# Patient Record
Sex: Male | Born: 1990 | Race: White | Hispanic: No | Marital: Single | Smoking: Current some day smoker
Health system: Southern US, Community
[De-identification: ages and names within clinical notes are randomized; demographics above are authoritative.]

## PROBLEM LIST (undated history)

## (undated) DIAGNOSIS — F329 Major depressive disorder, single episode, unspecified: Secondary | ICD-10-CM

## (undated) DIAGNOSIS — R569 Unspecified convulsions: Secondary | ICD-10-CM

## (undated) DIAGNOSIS — G610 Guillain-Barre syndrome: Secondary | ICD-10-CM

## (undated) DIAGNOSIS — F419 Anxiety disorder, unspecified: Secondary | ICD-10-CM

## (undated) HISTORY — PX: OTHER SURGICAL HISTORY: SHX169

## (undated) HISTORY — PX: TONSILLECTOMY: SUR1361

---
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
## 2013-01-02 ENCOUNTER — Emergency Department (HOSPITAL_COMMUNITY)
Admission: EM | Admit: 2013-01-02 | Discharge: 2013-01-02 | Disposition: A | Discharge status: Home / Self Care | Payer: Self-pay | Attending: Emergency Medicine | Admitting: Emergency Medicine

## 2013-01-02 ENCOUNTER — Encounter (HOSPITAL_COMMUNITY): Payer: Self-pay | Admitting: Emergency Medicine

## 2013-01-02 DIAGNOSIS — Y929 Unspecified place or not applicable: Secondary | ICD-10-CM | POA: Insufficient documentation

## 2013-01-02 DIAGNOSIS — Y939 Activity, unspecified: Secondary | ICD-10-CM | POA: Insufficient documentation

## 2013-01-02 DIAGNOSIS — L0390 Cellulitis, unspecified: Secondary | ICD-10-CM

## 2013-01-02 DIAGNOSIS — W57XXXA Bitten or stung by nonvenomous insect and other nonvenomous arthropods, initial encounter: Secondary | ICD-10-CM | POA: Insufficient documentation

## 2013-01-02 DIAGNOSIS — L02419 Cutaneous abscess of limb, unspecified: Secondary | ICD-10-CM | POA: Insufficient documentation

## 2013-01-02 DIAGNOSIS — T148 Other injury of unspecified body region: Secondary | ICD-10-CM | POA: Insufficient documentation

## 2013-01-02 DIAGNOSIS — R21 Rash and other nonspecific skin eruption: Secondary | ICD-10-CM | POA: Insufficient documentation

## 2013-01-02 DIAGNOSIS — L03119 Cellulitis of unspecified part of limb: Secondary | ICD-10-CM | POA: Insufficient documentation

## 2013-01-02 MED ORDER — PERMETHRIN 5 % EX CREA: TOPICAL_CREAM | Freq: Once | CUTANEOUS | Status: DC

## 2013-01-02 MED ORDER — HYDROCORTISONE 2.5 % EX LOTN
TOPICAL_LOTION | Freq: Two times a day (BID) | CUTANEOUS | Status: DC
Start: 2013-01-02 — End: 2015-05-18

## 2013-01-02 MED ORDER — DIPHENHYDRAMINE HCL 50 MG/ML IJ SOLN
25.0000 mg | Freq: Once | INTRAMUSCULAR | Status: AC
  Administered 2013-01-02: 25 mg via INTRAMUSCULAR
  Filled 2013-01-02: qty 1

## 2013-01-02 MED ORDER — CEPHALEXIN 250 MG PO CAPS: 500.0000 mg | ORAL_CAPSULE | Freq: Once | ORAL | Status: DC

## 2013-01-02 MED ORDER — CEPHALEXIN 500 MG PO CAPS: 500.0000 mg | ORAL_CAPSULE | Freq: Four times a day (QID) | ORAL | Status: DC

## 2013-01-02 NOTE — ED Provider Notes (Signed)
History    This chart was scribed for non-physician practitioner working with Donald Anger, DO by [name], ED Scribe. This patient was seen in room TR10C/TR10C and the patient's care was started at 2032.   CSN: [id number]  Arrival date & time 01/02/13  2032   None     Chief Complaint  Patient presents with  . Insect Bite     The history is provided by the patient. No language interpreter was used.    Donald Mckay is a 22 y.o. male who presents to the Emergency Department complaining of new, constant, multiple insect bites to the fingers, upper extremities and RLE onset yesterday. Pt states he has other people who live with him and they do not show any similar symptoms; however patient's sexual partner states he is one bite on him. He states [employer] came by to spray their apartment yesterday for bugs and he likes to lay on the floor. Pt's partner has a dog but states "he is very clean." He reports the bites are very itchy and stinging. Pt reports changing the mattress and bombing the apartment for bugs.   History reviewed. No pertinent past medical history.  History reviewed. No pertinent past surgical history.  No family history on file.  History  Substance Use Topics  . Smoking status: Never Smoker   . Smokeless tobacco: Not on file  . Alcohol Use: No      Review of Systems  Constitutional: Negative for fever, diaphoresis, appetite change, fatigue and unexpected weight change.  HENT: Negative for mouth sores and neck stiffness.   Eyes: Negative for visual disturbance.  Respiratory: Negative for cough, chest tightness, shortness of breath and wheezing.   Cardiovascular: Negative for chest pain.  Gastrointestinal: Negative for nausea, vomiting, abdominal pain, diarrhea and constipation.  Endocrine: Negative for polydipsia, polyphagia and polyuria.  Genitourinary: Negative for dysuria, urgency, frequency and hematuria.  Musculoskeletal: Negative for back  pain.  Skin: Positive for rash.  Allergic/Immunologic: Negative for immunocompromised state.  Neurological: Negative for syncope, light-headedness and headaches.  Hematological: Does not bruise/bleed easily.  Psychiatric/Behavioral: Negative for sleep disturbance. The patient is not nervous/anxious.     Allergies  Ibuprofen  Home Medications   Current Outpatient Rx  Name  Route  Sig  Dispense  Refill  . cephALEXin (KEFLEX) 500 MG capsule   Oral   Take 1 capsule (500 mg total) by mouth 4 (four) times daily.   40 capsule   0   . hydrocortisone 2.5 % lotion   Topical   Apply topically 2 (two) times daily.   59 mL   0   . permethrin (ACTICIN) 5 % cream   Topical   Apply topically once.   60 g   0     BP 146/84  Pulse 93  Temp(Src) 98.8 F (37.1 C) (Oral)  Resp 16  SpO2 97%  Physical Exam  Nursing note and vitals reviewed. Constitutional: He appears well-developed and well-nourished. No distress.  HENT:  Head: Normocephalic and atraumatic.  Mouth/Throat: Oropharynx is clear and moist. No oropharyngeal exudate.  Eyes: Conjunctivae are normal. No scleral icterus.  Neck: Normal range of motion. Neck supple.  Cardiovascular: Normal rate, regular rhythm and intact distal pulses.   Pulmonary/Chest: Effort normal and breath sounds normal. No respiratory distress. He has no wheezes.  Abdominal: Soft. Bowel sounds are normal. He exhibits no mass. There is no tenderness. There is no rebound and no guarding.  Musculoskeletal: Normal range of motion.  He exhibits no edema.  Neurological: He is alert.  Speech is clear and goal oriented Moves extremities without ataxia  Skin: Skin is warm and dry. Rash noted. He is not diaphoretic.  Multiple, scattered, erythematous, raised papules with significant excoriation. 3cm x 3cm patch on RLE with mild cellulitis but no area fluctuance. Lesions noted between the fingers on the right hand.   Psychiatric: He has a normal mood and affect.     ED Course  Procedures (including critical care time)  DIAGNOSTIC STUDIES: Oxygen Saturation is 97% on room air, adequate by my interpretation.    COORDINATION OF CARE: 10:15 PM-Discussed treatment plan with pt at bedside and pt agreed to plan.    Labs Reviewed - No data to display No results found.   1. Insect bite   2. Cellulitis       MDM  Rudene Re sounds with multiple bites to the legs, feet and arms. Right ear consistent with insect bites such as bed bugs are possibly mosquitoes.  Left hand with several bites between the webs of the fingers, but no other lesions consistent with scabies.  Right lower extremity lesion with mild cellulitis. We'll discharge home with Keflex, permethrin and hydrocortisone lotion. Suggested the patient follow directions to rid apartment of scabies or bed bugs.  I have also discussed reasons to return immediately to the ER.  Patient expresses understanding and agrees with plan.  I personally performed the services described in this documentation, which was scribed in my presence. The recorded information has been reviewed and is accurate.   Donald Client Roseline Ebarb, PA-C 01/02/13 2313

## 2013-01-02 NOTE — ED Notes (Signed)
PT. REPORTS MULTIPLE INSECT BITES AT FINGERS/RIGHT LOWER LEG ONSET YESTERDAY .

## 2013-01-03 NOTE — ED Provider Notes (Signed)
Medical screening examination/treatment/procedure(s) were performed by non-physician practitioner and as supervising physician I was immediately available for consultation/collaboration.   [name], DO 01/03/13 [phone]

## 2013-06-17 ENCOUNTER — Emergency Department: Payer: Self-pay | Admitting: Emergency Medicine

## 2013-06-17 LAB — CBC
HGB: 15.6 g/dL (ref 13.0–18.0)
MCH: 31 pg (ref 26.0–34.0)
MCHC: 35 g/dL (ref 32.0–36.0)
MCV: 89 fL (ref 80–100)
RBC: 5.04 10*6/uL (ref 4.40–5.90)
WBC: 8.2 10*3/uL (ref 3.8–10.6)

## 2013-06-17 LAB — COMPREHENSIVE METABOLIC PANEL
Albumin: 4.2 g/dL (ref 3.4–5.0)
Alkaline Phosphatase: 82 U/L (ref 50–136)
Anion Gap: 3 — ABNORMAL LOW (ref 7–16)
BUN: 7 mg/dL (ref 7–18)
Calcium, Total: 9.3 mg/dL (ref 8.5–10.1)
EGFR (African American): >60
Potassium: 3.6 mmol/L (ref 3.5–5.1)
SGPT (ALT): 52 U/L (ref 12–78)
Sodium: 139 mmol/L (ref 136–145)
Total Protein: 7 g/dL (ref 6.4–8.2)

## 2013-06-19 ENCOUNTER — Emergency Department: Payer: Self-pay | Admitting: Emergency Medicine

## 2013-06-19 LAB — TROPONIN I: Troponin-I: <0.02 ng/mL

## 2013-06-27 ENCOUNTER — Emergency Department: Payer: Self-pay | Admitting: Emergency Medicine

## 2013-06-27 LAB — COMPREHENSIVE METABOLIC PANEL
Albumin: 4.2 g/dL (ref 3.4–5.0)
Alkaline Phosphatase: 86 U/L (ref 50–136)
Anion Gap: 5 — ABNORMAL LOW (ref 7–16)
BUN: 12 mg/dL (ref 7–18)
Creatinine: 1.03 mg/dL (ref 0.60–1.30)
EGFR (African American): >60
EGFR (Non-African Amer.): >60
Glucose: 98 mg/dL (ref 65–99)
SGOT(AST): 22 U/L (ref 15–37)
Total Protein: 7.5 g/dL (ref 6.4–8.2)

## 2013-06-27 LAB — CBC
MCV: 89 fL (ref 80–100)
Platelet: 272 10*3/uL (ref 150–440)
RBC: 5.19 10*6/uL (ref 4.40–5.90)
WBC: 8.1 10*3/uL (ref 3.8–10.6)

## 2013-06-27 LAB — CK TOTAL AND CKMB (NOT AT ARMC)
CK, Total: 64 U/L (ref 35–232)
CK-MB: 0.5 ng/mL (ref 0.5–3.6)

## 2013-06-27 LAB — PROTIME-INR: Prothrombin Time: 13.7 secs (ref 11.5–14.7)

## 2013-06-27 LAB — MONONUCLEOSIS SCREEN: Mono Test: NEGATIVE

## 2013-06-27 LAB — TROPONIN I: Troponin-I: <0.02 ng/mL

## 2013-06-27 LAB — APTT: Activated PTT: 29.4 secs (ref 23.6–35.9)

## 2014-01-28 ENCOUNTER — Emergency Department: Payer: Self-pay | Admitting: Emergency Medicine

## 2014-01-28 LAB — COMPREHENSIVE METABOLIC PANEL
ALT: 33 U/L (ref 12–78)
ANION GAP: 4 — AB (ref 7–16)
Albumin: 3.9 g/dL (ref 3.4–5.0)
Alkaline Phosphatase: 69 U/L
BILIRUBIN TOTAL: 0.4 mg/dL (ref 0.2–1.0)
BUN: 10 mg/dL (ref 7–18)
CALCIUM: 9 mg/dL (ref 8.5–10.1)
CHLORIDE: 108 mmol/L — AB (ref 98–107)
Co2: 29 mmol/L (ref 21–32)
Creatinine: 0.79 mg/dL (ref 0.60–1.30)
EGFR (African American): >60
Glucose: 94 mg/dL (ref 65–99)
Osmolality: 280 (ref 275–301)
POTASSIUM: 4.2 mmol/L (ref 3.5–5.1)
SGOT(AST): 22 U/L (ref 15–37)
Sodium: 141 mmol/L (ref 136–145)
TOTAL PROTEIN: 7.3 g/dL (ref 6.4–8.2)

## 2014-01-28 LAB — CBC WITH DIFFERENTIAL/PLATELET
Basophil #: 0.1 10*3/uL (ref 0.0–0.1)
Basophil %: 1 %
EOS ABS: 0.4 10*3/uL (ref 0.0–0.7)
EOS PCT: 5 %
HCT: 46 % (ref 40.0–52.0)
HGB: 15.7 g/dL (ref 13.0–18.0)
LYMPHS ABS: 2.1 10*3/uL (ref 1.0–3.6)
Lymphocyte %: 26.7 %
MCH: 30.9 pg (ref 26.0–34.0)
MCHC: 34 g/dL (ref 32.0–36.0)
MCV: 91 fL (ref 80–100)
MONOS PCT: 6.1 %
Monocyte #: 0.5 x10 3/mm (ref 0.2–1.0)
Neutrophil #: 4.8 10*3/uL (ref 1.4–6.5)
Neutrophil %: 61.2 %
PLATELETS: 245 10*3/uL (ref 150–440)
RBC: 5.07 10*6/uL (ref 4.40–5.90)
RDW: 13.2 % (ref 11.5–14.5)
WBC: 7.9 10*3/uL (ref 3.8–10.6)

## 2014-01-28 LAB — TROPONIN I: Troponin-I: <0.02 ng/mL

## 2014-01-28 LAB — LIPASE, BLOOD: LIPASE: 169 U/L (ref 73–393)

## 2014-02-01 ENCOUNTER — Emergency Department: Payer: Self-pay | Admitting: Emergency Medicine

## 2014-02-01 LAB — CBC
HCT: 47.9 % (ref 40.0–52.0)
HGB: 16.3 g/dL (ref 13.0–18.0)
MCH: 31 pg (ref 26.0–34.0)
MCHC: 34 g/dL (ref 32.0–36.0)
MCV: 91 fL (ref 80–100)
Platelet: 258 10*3/uL (ref 150–440)
RBC: 5.26 10*6/uL (ref 4.40–5.90)
RDW: 13.1 % (ref 11.5–14.5)
WBC: 7.1 10*3/uL (ref 3.8–10.6)

## 2014-02-01 LAB — COMPREHENSIVE METABOLIC PANEL
ALBUMIN: 4.1 g/dL (ref 3.4–5.0)
ALK PHOS: 68 U/L
ALT: 30 U/L (ref 12–78)
AST: 25 U/L (ref 15–37)
Anion Gap: 2 — ABNORMAL LOW (ref 7–16)
BILIRUBIN TOTAL: 0.7 mg/dL (ref 0.2–1.0)
BUN: 16 mg/dL (ref 7–18)
CALCIUM: 9 mg/dL (ref 8.5–10.1)
CREATININE: 1 mg/dL (ref 0.60–1.30)
Chloride: 108 mmol/L — ABNORMAL HIGH (ref 98–107)
Co2: 28 mmol/L (ref 21–32)
GLUCOSE: 87 mg/dL (ref 65–99)
OSMOLALITY: 276 (ref 275–301)
POTASSIUM: 4 mmol/L (ref 3.5–5.1)
Sodium: 138 mmol/L (ref 136–145)
TOTAL PROTEIN: 7.5 g/dL (ref 6.4–8.2)

## 2014-02-01 LAB — TROPONIN I: Troponin-I: <0.02 ng/mL

## 2014-04-21 ENCOUNTER — Emergency Department: Payer: Self-pay | Admitting: Emergency Medicine

## 2014-04-21 LAB — COMPREHENSIVE METABOLIC PANEL
ALBUMIN: 4.1 g/dL (ref 3.4–5.0)
ALK PHOS: 65 U/L
ANION GAP: 9 (ref 7–16)
BUN: 8 mg/dL (ref 7–18)
Bilirubin,Total: 1 mg/dL (ref 0.2–1.0)
CALCIUM: 9.3 mg/dL (ref 8.5–10.1)
CO2: 27 mmol/L (ref 21–32)
CREATININE: 0.94 mg/dL (ref 0.60–1.30)
Chloride: 103 mmol/L (ref 98–107)
Glucose: 103 mg/dL — ABNORMAL HIGH (ref 65–99)
OSMOLALITY: 276 (ref 275–301)
POTASSIUM: 3.1 mmol/L — AB (ref 3.5–5.1)
SGOT(AST): 18 U/L (ref 15–37)
SGPT (ALT): 36 U/L
Sodium: 139 mmol/L (ref 136–145)
TOTAL PROTEIN: 7.4 g/dL (ref 6.4–8.2)

## 2014-04-21 LAB — MAGNESIUM: MAGNESIUM: 1.8 mg/dL

## 2014-04-21 LAB — LIPASE, BLOOD: Lipase: 132 U/L (ref 73–393)

## 2015-05-11 ENCOUNTER — Emergency Department (HOSPITAL_COMMUNITY): Payer: Medicaid Other

## 2015-05-11 ENCOUNTER — Inpatient Hospital Stay (HOSPITAL_COMMUNITY)
Admission: EM | Admit: 2015-05-11 | Discharge: 2015-05-18 | DRG: 095 | Disposition: A | Discharge status: Home Health | Payer: Medicaid Other | Attending: Internal Medicine | Admitting: Internal Medicine

## 2015-05-11 ENCOUNTER — Encounter (HOSPITAL_COMMUNITY): Payer: Self-pay | Admitting: *Deleted

## 2015-05-11 DIAGNOSIS — T40605A Adverse effect of unspecified narcotics, initial encounter: Secondary | ICD-10-CM | POA: Diagnosis not present

## 2015-05-11 DIAGNOSIS — R112 Nausea with vomiting, unspecified: Secondary | ICD-10-CM | POA: Diagnosis present

## 2015-05-11 DIAGNOSIS — D72829 Elevated white blood cell count, unspecified: Secondary | ICD-10-CM | POA: Diagnosis present

## 2015-05-11 DIAGNOSIS — F329 Major depressive disorder, single episode, unspecified: Secondary | ICD-10-CM | POA: Diagnosis present

## 2015-05-11 DIAGNOSIS — Z0189 Encounter for other specified special examinations: Secondary | ICD-10-CM

## 2015-05-11 DIAGNOSIS — F17210 Nicotine dependence, cigarettes, uncomplicated: Secondary | ICD-10-CM | POA: Diagnosis present

## 2015-05-11 DIAGNOSIS — K5909 Other constipation: Secondary | ICD-10-CM | POA: Diagnosis not present

## 2015-05-11 DIAGNOSIS — F32A Depression, unspecified: Secondary | ICD-10-CM | POA: Clinically undetermined

## 2015-05-11 DIAGNOSIS — G4700 Insomnia, unspecified: Secondary | ICD-10-CM | POA: Diagnosis not present

## 2015-05-11 DIAGNOSIS — Z823 Family history of stroke: Secondary | ICD-10-CM

## 2015-05-11 DIAGNOSIS — R200 Anesthesia of skin: Secondary | ICD-10-CM | POA: Diagnosis present

## 2015-05-11 DIAGNOSIS — E669 Obesity, unspecified: Secondary | ICD-10-CM | POA: Diagnosis present

## 2015-05-11 DIAGNOSIS — F419 Anxiety disorder, unspecified: Secondary | ICD-10-CM | POA: Diagnosis present

## 2015-05-11 DIAGNOSIS — R51 Headache: Secondary | ICD-10-CM | POA: Diagnosis not present

## 2015-05-11 DIAGNOSIS — Z6841 Body Mass Index (BMI) 40.0 and over, adult: Secondary | ICD-10-CM

## 2015-05-11 DIAGNOSIS — Z809 Family history of malignant neoplasm, unspecified: Secondary | ICD-10-CM

## 2015-05-11 DIAGNOSIS — Z886 Allergy status to analgesic agent status: Secondary | ICD-10-CM

## 2015-05-11 DIAGNOSIS — F17200 Nicotine dependence, unspecified, uncomplicated: Secondary | ICD-10-CM | POA: Diagnosis present

## 2015-05-11 DIAGNOSIS — H538 Other visual disturbances: Secondary | ICD-10-CM | POA: Diagnosis not present

## 2015-05-11 DIAGNOSIS — G959 Disease of spinal cord, unspecified: Secondary | ICD-10-CM

## 2015-05-11 DIAGNOSIS — G610 Guillain-Barre syndrome: Principal | ICD-10-CM | POA: Diagnosis present

## 2015-05-11 LAB — URINALYSIS, ROUTINE W REFLEX MICROSCOPIC
BILIRUBIN URINE: NEGATIVE
Glucose, UA: NEGATIVE mg/dL
Hgb urine dipstick: NEGATIVE
KETONES UR: NEGATIVE mg/dL
NITRITE: NEGATIVE
PH: 6 (ref 5.0–8.0)
PROTEIN: NEGATIVE mg/dL
Specific Gravity, Urine: 1.019 (ref 1.005–1.030)
Urobilinogen, UA: 0.2 mg/dL (ref 0.0–1.0)

## 2015-05-11 LAB — COMPREHENSIVE METABOLIC PANEL
ALK PHOS: 55 U/L (ref 38–126)
ALT: 37 U/L (ref 17–63)
AST: 31 U/L (ref 15–41)
Albumin: 4.1 g/dL (ref 3.5–5.0)
Anion gap: 4 — ABNORMAL LOW (ref 5–15)
BILIRUBIN TOTAL: 0.6 mg/dL (ref 0.3–1.2)
CALCIUM: 9.1 mg/dL (ref 8.9–10.3)
CHLORIDE: 109 mmol/L (ref 101–111)
CO2: 28 mmol/L (ref 22–32)
CREATININE: 0.8 mg/dL (ref 0.61–1.24)
Glucose, Bld: 93 mg/dL (ref 65–99)
Potassium: 3.7 mmol/L (ref 3.5–5.1)
Sodium: 141 mmol/L (ref 135–145)
TOTAL PROTEIN: 6.6 g/dL (ref 6.5–8.1)

## 2015-05-11 LAB — CBC
HCT: 45.3 % (ref 39.0–52.0)
Hemoglobin: 15.2 g/dL (ref 13.0–17.0)
MCH: 29.9 pg (ref 26.0–34.0)
MCHC: 33.6 g/dL (ref 30.0–36.0)
MCV: 89.2 fL (ref 78.0–100.0)
PLATELETS: 250 10*3/uL (ref 150–400)
RBC: 5.08 MIL/uL (ref 4.22–5.81)
RDW: 12.8 % (ref 11.5–15.5)
WBC: 7.3 10*3/uL (ref 4.0–10.5)

## 2015-05-11 LAB — RAPID URINE DRUG SCREEN, HOSP PERFORMED
AMPHETAMINES: NOT DETECTED
BARBITURATES: NOT DETECTED
BENZODIAZEPINES: NOT DETECTED
Cocaine: NOT DETECTED
Opiates: NOT DETECTED
Tetrahydrocannabinol: POSITIVE — AB

## 2015-05-11 LAB — ACETAMINOPHEN LEVEL: Acetaminophen (Tylenol), Serum: <10 ug/mL — ABNORMAL LOW (ref 10–30)

## 2015-05-11 LAB — SALICYLATE LEVEL

## 2015-05-11 LAB — URINE MICROSCOPIC-ADD ON

## 2015-05-11 LAB — AMMONIA: Ammonia: 46 umol/L — ABNORMAL HIGH (ref 9–35)

## 2015-05-11 LAB — LIPASE, BLOOD: Lipase: 25 U/L (ref 22–51)

## 2015-05-11 LAB — ETHANOL: Alcohol, Ethyl (B): <5 mg/dL (ref <5)

## 2015-05-11 MED ORDER — SODIUM CHLORIDE 0.9 % IV SOLN
INTRAVENOUS | Status: AC
  Administered 2015-05-12: via INTRAVENOUS

## 2015-05-11 MED ORDER — SENNOSIDES-DOCUSATE SODIUM 8.6-50 MG PO TABS: 1.0000 | ORAL_TABLET | Freq: Every evening | ORAL | Status: DC | PRN

## 2015-05-11 MED ORDER — SODIUM CHLORIDE 0.9 % IV BOLUS (SEPSIS)
1000.0000 mL | Freq: Once | INTRAVENOUS | Status: AC
  Administered 2015-05-11: 1000 mL via INTRAVENOUS

## 2015-05-11 NOTE — ED Notes (Signed)
Pt was discharged by mistake by Marinda Elk, RN. Wrong departure condition documented

## 2015-05-11 NOTE — Progress Notes (Signed)
CM spoke with pt who confirms uninsured [place] resident with no pcp.  CM discussed and provided written information for uninsured accepting pcps, discussed the importance of pcp vs EDP services for f/u care, www.needymeds.org, www.goodrx.com, discounted pharmacies and other [place] resources such as [facility] , [facility], affordable care act, financial assistance, uninsured dental services, [place] [facility] assist, DSS and  health department  Reviewed resources for [place] uninsured accepting pcps like [facility], [facility] [address], [facility], [facility], local urgent care centers, [facility], [facility], general medical clinics, [facility], [facility] plus others, medication resources, [facility] out patient [facility]ies and housing Pt voiced understanding and appreciation of resources provided   

## 2015-05-11 NOTE — ED Provider Notes (Signed)
CSN: [id number]     Arrival date & time 05/11/15  1400 History   First MD Initiated Contact with Patient 05/11/15 1637     Chief Complaint  Patient presents with  . Emesis  . Confusion      (Consider location/radiation/quality/duration/timing/severity/associated sxs/prior Treatment) HPI Comments: Patient presents to the emergency department with chief complaint of headache, neck pain, nausea, vomiting, and generalized numbness 3 hours. Patient states that the numbness started in his feet and has progressively moved upward. He denies any weakness. He denies any known medical problems, denies any substance abuse. Patient states that he has been having persistent nausea and vomiting for the past 2 days. States that he no longer has anything to vomit. He denies any abdominal pain, or dysuria. He states that he has never experienced anything like this before. There are no aggravating or alleviating factors.  The history is provided by the patient. No language interpreter was used.    History reviewed. No pertinent past medical history. History reviewed. No pertinent past surgical history. History reviewed. No pertinent family history. Social History  Substance Use Topics  . Smoking status: Never Smoker   . Smokeless tobacco: None  . Alcohol Use: No    Review of Systems  Constitutional: Negative for fever and chills.  Respiratory: Negative for shortness of breath.   Cardiovascular: Negative for chest pain.  Gastrointestinal: Positive for nausea and vomiting. Negative for diarrhea and constipation.  Genitourinary: Negative for dysuria.  Neurological: Positive for numbness and headaches.  All other systems reviewed and are negative.     Allergies  Ibuprofen  Home Medications   Prior to Admission medications   Medication Sig Start Date End Date Taking? Authorizing Provider  cephALEXin (KEFLEX) 500 MG capsule Take 1 capsule (500 mg total) by mouth 4 (four) times daily. 01/02/13    Hannah Muthersbaugh, PA-C  hydrocortisone 2.5 % lotion Apply topically 2 (two) times daily. 01/02/13   Hannah Muthersbaugh, PA-C  permethrin (ACTICIN) 5 % cream Apply topically once. 01/02/13   Hannah Muthersbaugh, PA-C   BP 147/100 mmHg  Pulse 77  Temp(Src) 98.1 F (36.7 C) (Oral)  Resp 16  SpO2 99% Physical Exam  Constitutional: He is oriented to person, place, and time. He appears well-developed and well-nourished.  HENT:  Head: Normocephalic and atraumatic.  Eyes: Conjunctivae and EOM are normal. Pupils are equal, round, and reactive to light. Right eye exhibits no discharge. Left eye exhibits no discharge. No scleral icterus.  Neck: Normal range of motion. Neck supple. No JVD present.  Cardiovascular: Normal rate, regular rhythm and normal heart sounds.  Exam reveals no gallop and no friction rub.   No murmur heard. Pulmonary/Chest: Effort normal and breath sounds normal. No respiratory distress. He has no wheezes. He has no rales. He exhibits no tenderness.  Abdominal: Soft. He exhibits no distension and no mass. There is no tenderness. There is no rebound and no guarding.  No focal abdominal tenderness, no RLQ tenderness or pain at McBurney's point, no RUQ tenderness or Murphy's sign, no left-sided abdominal tenderness, no fluid wave, or signs of peritonitis   Musculoskeletal: Normal range of motion. He exhibits no edema or tenderness.  Neurological: He is alert and oriented to person, place, and time.  Decreased sensation in upper and lower extremities, unable to decipher between sharp and dull sensation, reflexes are symmetrical but slightly diminished, strength is slightly diminished but equal bilaterally  Skin: Skin is warm and dry.  Psychiatric: He has a normal mood  and affect. His behavior is normal. Judgment and thought content normal.  Nursing note and vitals reviewed.   ED Course  Procedures (including critical care time) Results for orders placed or performed during the  hospital encounter of 05/11/15  Lipase, blood  Result Value Ref Range   Lipase 25 22 - 51 U/L  Comprehensive metabolic panel  Result Value Ref Range   Sodium 141 135 - 145 mmol/L   Potassium 3.7 3.5 - 5.1 mmol/L   Chloride 109 101 - 111 mmol/L   CO2 28 22 - 32 mmol/L   Glucose, Bld 93 65 - 99 mg/dL   BUN <5 (L) 6 - 20 mg/dL   Creatinine, Ser 1.02 0.61 - 1.24 mg/dL   Calcium 9.1 8.9 - 72.5 mg/dL   Total Protein 6.6 6.5 - 8.1 g/dL   Albumin 4.1 3.5 - 5.0 g/dL   AST 31 15 - 41 U/L   ALT 37 17 - 63 U/L   Alkaline Phosphatase 55 38 - 126 U/L   Total Bilirubin 0.6 0.3 - 1.2 mg/dL   GFR calc non Af Amer >60 >60 mL/min   GFR calc Af Amer >60 >60 mL/min   Anion gap 4 (L) 5 - 15  CBC  Result Value Ref Range   WBC 7.3 4.0 - 10.5 K/uL   RBC 5.08 4.22 - 5.81 MIL/uL   Hemoglobin 15.2 13.0 - 17.0 g/dL   HCT 36.6 44.0 - 34.7 %   MCV 89.2 78.0 - 100.0 fL   MCH 29.9 26.0 - 34.0 pg   MCHC 33.6 30.0 - 36.0 g/dL   RDW 42.5 95.6 - 38.7 %   Platelets 250 150 - 400 K/uL  Urinalysis, Routine w reflex microscopic (not at [facility])  Result Value Ref Range   Color, Urine AMBER (A) YELLOW   APPearance CLOUDY (A) CLEAR   Specific Gravity, Urine 1.019 1.005 - 1.030   pH 6.0 5.0 - 8.0   Glucose, UA NEGATIVE NEGATIVE mg/dL   Hgb urine dipstick NEGATIVE NEGATIVE   Bilirubin Urine NEGATIVE NEGATIVE   Ketones, ur NEGATIVE NEGATIVE mg/dL   Protein, ur NEGATIVE NEGATIVE mg/dL   Urobilinogen, UA 0.2 0.0 - 1.0 mg/dL   Nitrite NEGATIVE NEGATIVE   Leukocytes, UA SMALL (A) NEGATIVE  Acetaminophen level  Result Value Ref Range   Acetaminophen (Tylenol), Serum <10 (L) 10 - 30 ug/mL  Salicylate level  Result Value Ref Range   Salicylate Lvl <4.0 2.8 - 30.0 mg/dL  Ethanol  Result Value Ref Range   Alcohol, Ethyl (B) <5 <5 mg/dL  Urine rapid drug screen (hosp performed)  Result Value Ref Range   Opiates NONE DETECTED NONE DETECTED   Cocaine NONE DETECTED NONE DETECTED   Benzodiazepines NONE DETECTED NONE  DETECTED   Amphetamines NONE DETECTED NONE DETECTED   Tetrahydrocannabinol POSITIVE (A) NONE DETECTED   Barbiturates NONE DETECTED NONE DETECTED  Ammonia  Result Value Ref Range   Ammonia 46 (H) 9 - 35 umol/L  Urine microscopic-add on  Result Value Ref Range   Squamous Epithelial / LPF FEW (A) RARE   WBC, UA 3-6 <3 WBC/hpf   Urine-Other MUCOUS PRESENT    Ct Head Wo Contrast  05/11/2015   CLINICAL DATA:  Confusion, numbness, vomiting  EXAM: CT HEAD WITHOUT CONTRAST  TECHNIQUE: Contiguous axial images were obtained from the base of the skull through the vertex without intravenous contrast.  COMPARISON:  None.  FINDINGS: No skull fracture is noted. Paranasal sinuses and mastoid air cells are  unremarkable. No intracranial hemorrhage, mass effect or midline shift. No acute infarction. No hydrocephalus. No mass lesion is noted on this unenhanced scan.  IMPRESSION: No acute intracranial abnormality.   Electronically Signed   By: Natasha Mead M.D.   On: 05/11/2015 17:49     Imaging Review No results found. I have personally reviewed and evaluated these images and lab results as part of my medical decision-making.   EKG Interpretation None      MDM   Final diagnoses:  Numbness   Patient with numbness ascending from feet to arms.  Unable to decipher between sharp and dull.  Difficult to tell if patient is being honest on exam.  Patient seen by and discussed with Dr. Corlis Leak, who recommend LP.  Differential includes Guillain-Barr.  Patient does not have loss of bowel or bladder function.  No respiratory distress.  8:37 PM Patient discussed with Dr. Hosie Poisson of neurology, who agrees with plan for admission and LP by IR in the morning.  Dr. Hosie Poisson will consult either tonight or in the morning.  Appreciate Dr. Toniann Fail for admitting the patient.  Roxy Horseman, PA-C 05/11/15 2103  Courteney Randall An, MD 05/11/15 1610  Courteney Randall An, MD 05/11/15 9604

## 2015-05-11 NOTE — ED Notes (Addendum)
Per ems pt is from home, c/o nausea/vomiting x2 days. generalized all over numbness x3 hours, started in feet and radiated upwards. Pt has intermittent confusion, pt able to give medical hx. Pt denies ETOH/substance abuse. Positive for nystagmus, no neurological changes that are continuous. Hand shake when pt not being engaged in conversation.   Upon rn assessment pt able to answer all questions appropriately, pts affect is slightly odd. Pt reports headache 10/10. Reports the last time he vomited was 1000.

## 2015-05-11 NOTE — ED Notes (Signed)
Pt is stable and in appropriate condition for discharge

## 2015-05-11 NOTE — ED Notes (Signed)
Patient is aware we need urine-urine at bedside  

## 2015-05-11 NOTE — H&P (Signed)
[facility] History and Physical  [name]. ZOX:[id number] DOB: Apr 01, 1991 DOA: 05/11/2015  Referring physician: Mr. Donald Client. PA. PCP: No PCP Per Patient  Specialists: None.  Chief Complaint: Numbness of the lower and upper extremities.  HPI: Donald Mckay. is a 24 y.o. male with no significant past medical history presents to the ER because of increasing numbness of the extremities progressing upwards. Patient's symptoms started yesterday around 11 AM. Patient's initial symptoms were numbness around his foot which has progressed to his upper extremities at this time. Patient states she also has sensation loss. Denies any incontinence of urine or bowels. On exam patient has poor deep tendon reflexes. Denies any recent fever chills or diarrhea episodes. Denies any recent sick contacts or vaccinations. On-call neurologist Dr. Hosie Poisson was consulted. Lumbar puncture was attempted in the ER but was unsuccessful and fluoroscopic guided lumbar puncture has been ordered. CT head is unremarkable. Patient denies any difficulty speaking or swallowing or breathing.   Review of Systems: As presented in the history of presenting illness, rest negative.  Past Medical History  Diagnosis Date  . Medical history non-contributory    Past Surgical History  Procedure Laterality Date  . Nasal cauterization     Social History:  reports that he has been smoking.  He does not have any smokeless tobacco history on file. He reports that he does not drink alcohol or use illicit drugs. Where does patient live home. Can patient participate in ADLs? Yes.  Allergies  Allergen Reactions  . Ibuprofen     Nose bleeds    Family History:  Family History  Problem Relation Age of Onset  . Cancer Mother   . Stroke Maternal Grandmother       Prior to Admission medications   Medication Sig Start Date End Date Taking? Authorizing Provider  cephALEXin (KEFLEX) 500 MG capsule Take 1  capsule (500 mg total) by mouth 4 (four) times daily. Patient not taking: Reported on 05/11/2015 01/02/13   Donald Client Muthersbaugh, PA-C  hydrocortisone 2.5 % lotion Apply topically 2 (two) times daily. Patient not taking: Reported on 05/11/2015 01/02/13   Donald Client Muthersbaugh, PA-C  permethrin (ACTICIN) 5 % cream Apply topically once. Patient not taking: Reported on 05/11/2015 01/02/13   Donald Forth, PA-C    Physical Exam: Filed Vitals:   05/11/15 1746 05/11/15 1955 05/11/15 2058 05/11/15 2221  BP: 137/74 136/84  134/78  Pulse: 67 72  68  Temp:  98.7 F (37.1 C) 98.4 F (36.9 C) 98.6 F (37 C)  TempSrc:  Oral  Oral  Resp: 18 18  18   Height:    6\' 1"  (1.854 m)  Weight:    138.8 kg (306 lb)  SpO2: 99% 100%  99%     General:  Obese not in distress.  Eyes: Anicteric no pallor.  ENT: No discharge from the ears eyes nose and mouth.  Neck: No neck rigidity.  Cardiovascular: S1-S2 heard.  Respiratory: No rhonchi or crepitations.  Abdomen: Soft nontender bowel sounds present.  Skin: No rash.  Musculoskeletal: No edema.  Psychiatric: Appears normal.  Neurologic: Alert awake oriented to time place and person. Moves all extremities. Has poor deep tendon refluxes. Patient also states he has poor sensation of his extremities at this time. No facial asymmetry. Tongue is midline. PERRLA positive.  Labs on Admission:  Basic Metabolic Panel:  Recent Labs Lab 05/11/15 1452  NA 141  K 3.7  CL 109  CO2 28  GLUCOSE 93  BUN <  5*  CREATININE 0.80  CALCIUM 9.1   Liver Function Tests:  Recent Labs Lab 05/11/15 1452  AST 31  ALT 37  ALKPHOS 55  BILITOT 0.6  PROT 6.6  ALBUMIN 4.1    Recent Labs Lab 05/11/15 1452  LIPASE 25    Recent Labs Lab 05/11/15 1800  AMMONIA 46*   CBC:  Recent Labs Lab 05/11/15 1452  WBC 7.3  HGB 15.2  HCT 45.3  MCV 89.2  PLT 250   Cardiac Enzymes: No results for input(s): CKTOTAL, CKMB, CKMBINDEX, TROPONINI in the last 168  hours.  BNP (last 3 results) No results for input(s): BNP in the last 8760 hours.  ProBNP (last 3 results) No results for input(s): PROBNP in the last 8760 hours.  CBG: No results for input(s): GLUCAP in the last 168 hours.  Radiological Exams on Admission: Ct Head Wo Contrast  05/11/2015   CLINICAL DATA:  Confusion, numbness, vomiting  EXAM: CT HEAD WITHOUT CONTRAST  TECHNIQUE: Contiguous axial images were obtained from the base of the skull through the vertex without intravenous contrast.  COMPARISON:  None.  FINDINGS: No skull fracture is noted. Paranasal sinuses and mastoid air cells are unremarkable. No intracranial hemorrhage, mass effect or midline shift. No acute infarction. No hydrocephalus. No mass lesion is noted on this unenhanced scan.  IMPRESSION: No acute intracranial abnormality.   Electronically Signed   By: Natasha Mead M.D.   On: 05/11/2015 17:49     Assessment/Plan Principal Problem:   Numbness   1. 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 Fluoroscopic guided lumbar puncture has been ordered since initial attempt by the ER physician was unsuccessful. On-call neurologist Dr. Hosie Poisson has been consulted and will be seeing patient in consult. At this time patient has been placed on neuro checks and close observation for any respiratory failure. Further recommendation based on lumbar puncture and neurology evaluation. 2. Tobacco abuse - tobacco cessation counseling requested. Chest x-ray is pending.   DVT Prophylaxis SCDs.  Code Status: Full code.  Family Communication: Discussed with patient.  Disposition Plan: Admit for observation.    KAKRAKANDY,ARSHAD N. [facility] Pager [phone].  If 7PM-7AM, please contact night-coverage [email or web address] Password [facility] 05/11/2015, 10:45 PM

## 2015-05-12 ENCOUNTER — Observation Stay (HOSPITAL_COMMUNITY): Payer: Medicaid Other

## 2015-05-12 DIAGNOSIS — F17200 Nicotine dependence, unspecified, uncomplicated: Secondary | ICD-10-CM | POA: Diagnosis present

## 2015-05-12 DIAGNOSIS — Z0189 Encounter for other specified special examinations: Secondary | ICD-10-CM | POA: Diagnosis not present

## 2015-05-12 DIAGNOSIS — F329 Major depressive disorder, single episode, unspecified: Secondary | ICD-10-CM | POA: Diagnosis not present

## 2015-05-12 DIAGNOSIS — Z720 Tobacco use: Secondary | ICD-10-CM | POA: Diagnosis not present

## 2015-05-12 DIAGNOSIS — R200 Anesthesia of skin: Secondary | ICD-10-CM | POA: Diagnosis not present

## 2015-05-12 DIAGNOSIS — G959 Disease of spinal cord, unspecified: Secondary | ICD-10-CM | POA: Diagnosis not present

## 2015-05-12 DIAGNOSIS — G610 Guillain-Barre syndrome: Secondary | ICD-10-CM | POA: Diagnosis not present

## 2015-05-12 LAB — COMPREHENSIVE METABOLIC PANEL
ALK PHOS: 50 U/L (ref 38–126)
ALT: 33 U/L (ref 17–63)
AST: 21 U/L (ref 15–41)
Albumin: 3.7 g/dL (ref 3.5–5.0)
Anion gap: 7 (ref 5–15)
BILIRUBIN TOTAL: 0.8 mg/dL (ref 0.3–1.2)
BUN: 6 mg/dL (ref 6–20)
CALCIUM: 8.8 mg/dL — AB (ref 8.9–10.3)
CO2: 28 mmol/L (ref 22–32)
CREATININE: 0.98 mg/dL (ref 0.61–1.24)
Chloride: 105 mmol/L (ref 101–111)
GFR calc Af Amer: >60 mL/min (ref >60)
GLUCOSE: 86 mg/dL (ref 65–99)
Potassium: 3.7 mmol/L (ref 3.5–5.1)
SODIUM: 140 mmol/L (ref 135–145)
TOTAL PROTEIN: 5.9 g/dL — AB (ref 6.5–8.1)

## 2015-05-12 LAB — CBC
HEMATOCRIT: 41.5 % (ref 39.0–52.0)
Hemoglobin: 13.8 g/dL (ref 13.0–17.0)
MCH: 30.4 pg (ref 26.0–34.0)
MCHC: 33.3 g/dL (ref 30.0–36.0)
MCV: 91.4 fL (ref 78.0–100.0)
PLATELETS: 232 10*3/uL (ref 150–400)
RBC: 4.54 MIL/uL (ref 4.22–5.81)
RDW: 13.2 % (ref 11.5–15.5)
WBC: 6.2 10*3/uL (ref 4.0–10.5)

## 2015-05-12 LAB — CSF CELL COUNT WITH DIFFERENTIAL
RBC Count, CSF: 938 /mm3 — ABNORMAL HIGH
Tube #: 1
WBC, CSF: 2 /mm3 (ref 0–5)

## 2015-05-12 LAB — GRAM STAIN

## 2015-05-12 LAB — GLUCOSE, CSF: GLUCOSE CSF: 58 mg/dL (ref 40–70)

## 2015-05-12 LAB — PROTEIN, CSF: TOTAL PROTEIN, CSF: 62 mg/dL — AB (ref 15–45)

## 2015-05-12 MED ORDER — IMMUNE GLOBULIN (HUMAN) 5 GM/50ML IV SOLN
400.0000 mg/kg | INTRAVENOUS | Status: AC
  Administered 2015-05-12 – 2015-05-16 (×5): 55 g via INTRAVENOUS
  Filled 2015-05-12 (×5): qty 50

## 2015-05-12 MED ORDER — METOCLOPRAMIDE HCL 5 MG/ML IJ SOLN
10.0000 mg | Freq: Once | INTRAMUSCULAR | Status: AC
  Administered 2015-05-12: 10 mg via INTRAVENOUS
  Filled 2015-05-12: qty 2

## 2015-05-12 MED ORDER — KETOROLAC TROMETHAMINE 30 MG/ML IJ SOLN
30.0000 mg | Freq: Once | INTRAMUSCULAR | Status: AC
  Administered 2015-05-12: 30 mg via INTRAVENOUS
  Filled 2015-05-12: qty 1

## 2015-05-12 MED ORDER — MORPHINE SULFATE (PF) 2 MG/ML IV SOLN
1.0000 mg | INTRAVENOUS | Status: DC | PRN
  Administered 2015-05-12 – 2015-05-13 (×3): 1 mg via INTRAVENOUS
  Filled 2015-05-12 (×3): qty 1

## 2015-05-12 MED ORDER — OXYCODONE-ACETAMINOPHEN 5-325 MG PO TABS
1.0000 | ORAL_TABLET | ORAL | Status: DC | PRN
  Administered 2015-05-12 – 2015-05-18 (×19): 2 via ORAL
  Filled 2015-05-12 (×19): qty 2

## 2015-05-12 MED ORDER — DIPHENHYDRAMINE HCL 50 MG/ML IJ SOLN
25.0000 mg | Freq: Once | INTRAMUSCULAR | Status: AC
  Administered 2015-05-12: 25 mg via INTRAVENOUS
  Filled 2015-05-12: qty 1

## 2015-05-12 MED ORDER — ACETAMINOPHEN 325 MG PO TABS
650.0000 mg | ORAL_TABLET | Freq: Four times a day (QID) | ORAL | Status: DC | PRN
  Administered 2015-05-12 – 2015-05-15 (×3): 650 mg via ORAL
  Filled 2015-05-12 (×3): qty 2

## 2015-05-12 NOTE — Progress Notes (Signed)
NIF -40cmH2O & Vital Capacity 2L

## 2015-05-12 NOTE — Progress Notes (Signed)
OT Cancellation Note  Patient Details Name: Donald Kotlyar Epping Jr. MRN: [id number] DOB: May 22, 1991   Cancelled Treatment:    Reason Eval/Treat Not Completed: Patient not medically ready - Pt underwent LP, and is to remain flat as much as possible, per orders.  Will reattempt eval tomorrow  Angelene Giovanni [place], OTR/L [phone]  05/12/2015, 2:18 PM

## 2015-05-12 NOTE — Procedures (Signed)
Informed consent was obtained from the patient prior to the procedure, including potential complications of headache, allergy, and pain. With the patient prone, the lower back was prepped with Betadine. 1% Lidocaine was used for local anesthesia. Lumbar puncture was performed at the [L5-S1] level using a [22] gauge needle with return of [clear] CSF with an opening pressure of [10] cm water. [7.5] ml of CSF were obtained for laboratory studies. The patient tolerated the procedure well and there were no apparent complications.

## 2015-05-12 NOTE — Progress Notes (Signed)
[facility]                                                                              Patient Demographics  Donald Mckay, is a 24 y.o. male, DOB - [birth year], XLK:[id number]  Admit date - 05/11/2015   Admitting Physician Eduard Clos, MD  Outpatient Primary MD for the patient is No PCP Per Patient  LOS - 1   Chief Complaint  Patient presents with  . Emesis  . Confusion        Brief HPI   Donald Carnes Inghram [name] is a 24 y.o. male with no significant past medical history presents to the ER because of increasing numbness of the extremities progressing upwards. Patient's symptoms started on 8/21 around 11 AM. Patient's initial symptoms were numbness around his foot which has progressed to his upper extremities at this time. Patient states she also has sensation loss. Denies any incontinence of urine or bowels.  patient did report nausea and vomiting for the last 2 days prior to admission. On exam patient has poor deep tendon reflexes. Denied any recent fever chills or diarrhea episodes. Denied any recent sick contacts or vaccinations. On-call neurologist Dr. Hosie Poisson was consulted. Lumbar puncture was attempted in the ER but was unsuccessful and fluoroscopic guided lumbar puncture has been ordered. CT head was unremarkable. Patient denied any difficulty speaking or swallowing or breathing.    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 - LP under fluoroscopy today  - Neurology has been consulted, will await further recommendations after the LP results - Continue neuro checks, NIF  Active Problems:   Tobacco use disorder - Consult strongly for smoking cessation, chest x-ray clear - Place nicotine patch   Code Status: Full code   Family Communication: Discussed in detail with the patient, all imaging results, lab results explained to the patient    Disposition  Plan: await neurology recommendations  Time Spent in minutes  25 minutes  Procedures  LP  Consults   neuro  DVT Prophylaxis  SCD's  Medications  Scheduled Meds:  Continuous Infusions: . sodium chloride 75 mL/hr at 05/12/15 0015   PRN Meds:.acetaminophen, senna-docusate   Antibiotics   Anti-infectives    None        Subjective:   Donald Mckay was seen and examined today.  Still continues to have numbness in both legs and legs feel heavy. Denies any shortness of breath. Patient denies dizziness, chest pain, shortness of breath, abdominal pain, N/V/D/C.  Objective:   Blood pressure 132/60, pulse 78, temperature 98.4 F (36.9 C), temperature source Oral, resp. rate 22, height 6\' 1"  (1.854 m), weight 138.8 kg (306 lb), SpO2 97 %.  Wt Readings from Last 3 Encounters:  05/11/15 138.8 kg (306 lb)     Intake/Output Summary (Last 24 hours) at 05/12/15 1054 Last data filed at 05/12/15 0903  Gross per 24 hour  Intake 746.25 ml  Output    150 ml  Net 596.25 ml    Exam  General: Alert and oriented x 3, NAD  HEENT:  PERRLA, EOMI,  Anicteric Sclera, mucous membranes moist.   Neck: Supple, no JVD, no masses  CVS: S1 S2 auscultated, no rubs, murmurs or gallops. Regular rate and rhythm.  Respiratory: Clear to auscultation bilaterally, no wheezing, rales or rhonchi  Abdomen: Soft, nontender, nondistended, + bowel sounds  Ext: no cyanosis clubbing or edema  Neuro: AAOx3, Cr N's II- XII. dec deep tendon reflexes, poor sensation of lower extremities   Skin: No rashes  Psych: Normal affect and demeanor, alert and oriented x3    Data Review   Micro Results No results found for this or any previous visit (from the past 240 hour(s)).  Radiology Reports Dg Chest 2 View  05/12/2015   CLINICAL DATA:  Weakness and increased numbness of the extremities.  EXAM: CHEST  2 VIEW  COMPARISON:  01/28/2014  FINDINGS: Cardiomediastinal silhouette is normal. Mediastinal  contours appear intact.  There is no evidence of focal airspace consolidation, pleural effusion or pneumothorax.  Osseous structures are without acute abnormality. Soft tissues are grossly normal.  IMPRESSION: No radiographic evidence of acute cardiopulmonary abnormality.   Electronically Signed   By: Ted Mcalpine M.D.   On: 05/12/2015 10:21   Ct Head Wo Contrast  05/11/2015   CLINICAL DATA:  Confusion, numbness, vomiting  EXAM: CT HEAD WITHOUT CONTRAST  TECHNIQUE: Contiguous axial images were obtained from the base of the skull through the vertex without intravenous contrast.  COMPARISON:  None.  FINDINGS: No skull fracture is noted. Paranasal sinuses and mastoid air cells are unremarkable. No intracranial hemorrhage, mass effect or midline shift. No acute infarction. No hydrocephalus. No mass lesion is noted on this unenhanced scan.  IMPRESSION: No acute intracranial abnormality.   Electronically Signed   By: Natasha Mead M.D.   On: 05/11/2015 17:49    CBC  Recent Labs Lab 05/11/15 1452 05/12/15 0509  WBC 7.3 6.2  HGB 15.2 13.8  HCT 45.3 41.5  PLT 250 232  MCV 89.2 91.4  MCH 29.9 30.4  MCHC 33.6 33.3  RDW 12.8 13.2    Chemistries   Recent Labs Lab 05/11/15 1452 05/12/15 0509  NA 141 140  K 3.7 3.7  CL 109 105  CO2 28 28  GLUCOSE 93 86  BUN <5* 6  CREATININE 0.80 0.98  CALCIUM 9.1 8.8*  AST 31 21  ALT 37 33  ALKPHOS 55 50  BILITOT 0.6 0.8   ------------------------------------------------------------------------------------------------------------------ estimated creatinine clearance is 171.6 mL/min (by C-G formula based on Cr of 0.98). ------------------------------------------------------------------------------------------------------------------ No results for input(s): HGBA1C in the last 72 hours. ------------------------------------------------------------------------------------------------------------------ No results for input(s): CHOL, HDL, LDLCALC, TRIG,  CHOLHDL, LDLDIRECT in the last 72 hours. ------------------------------------------------------------------------------------------------------------------ No results for input(s): TSH, T4TOTAL, T3FREE, THYROIDAB in the last 72 hours.  Invalid input(s): FREET3 ------------------------------------------------------------------------------------------------------------------ No results for input(s): VITAMINB12, FOLATE, FERRITIN, TIBC, IRON, RETICCTPCT in the last 72 hours.  Coagulation profile No results for input(s): INR, PROTIME in the last 168 hours.  No results for input(s): DDIMER in the last 72 hours.  Cardiac Enzymes No results for input(s): CKMB, TROPONINI, MYOGLOBIN in the last 168 hours.  Invalid input(s): CK ------------------------------------------------------------------------------------------------------------------ Invalid input(s): POCBNP  No results for input(s): GLUCAP in the last 72 hours.   RAI,RIPUDEEP M.D. [facility] 05/12/2015, 10:54 AM  Pager: [phone] Between 7am to 7pm - call Pager - [phone]  After 7pm go to [email or web address] - password [email or web address]  Call night coverage person covering after 7pm

## 2015-05-12 NOTE — Progress Notes (Signed)
PT Cancellation Note  Patient Details Name: Donald Reicher Nowling Jr. MRN: [id number] DOB: 03-04-1991   Cancelled Treatment:    Reason Eval/Treat Not Completed: Medical issues which prohibited therapy (awaiting LP and neurologist recommendations)   Tanaiya Kolarik,KATHrine E 05/12/2015, 11:04 AM Zenovia Jarred, PT, DPT 05/12/2015 Pager: [phone]

## 2015-05-12 NOTE — Consult Note (Signed)
Admission H&P    Chief Complaint: Ascending pattern of numbness which 2 days.  HPI: Donald Mckay Louderback Brooke Bonito. is an 24 y.o. male with no documented previous medical history, presenting with numbness for 2 days which started in his legs and feet and have spread to involve his distal upper extremities. He has not experienced weakness involving his lower extremities. He said no bowel nor bladder control abnormalities. He's been experiencing headache with nausea and vomiting. CT scan of his head showed no acute intracranial abnormality. Lumbar puncture was attempted, but was unsuccessful. Lumbar puncture under fluoroscopy has been ordered and is pending. Patient has had no evidence of breath no speech or swallowing abnormalities.  Past Medical History  Diagnosis Date  . Medical history non-contributory     Past Surgical History  Procedure Laterality Date  . Nasal cauterization      Family History  Problem Relation Age of Onset  . Cancer Mother   . Stroke Maternal Grandmother    Social History:  reports that he has been smoking.  He does not have any smokeless tobacco history on file. He reports that he does not drink alcohol or use illicit drugs.  Allergies:  Allergies  Allergen Reactions  . Ibuprofen     Nose bleeds    Medications Prior to Admission  Medication Sig Dispense Refill  . cephALEXin (KEFLEX) 500 MG capsule Take 1 capsule (500 mg total) by mouth 4 (four) times daily. (Patient not taking: Reported on 05/11/2015) 40 capsule 0  . hydrocortisone 2.5 % lotion Apply topically 2 (two) times daily. (Patient not taking: Reported on 05/11/2015) 59 mL 0  . permethrin (ACTICIN) 5 % cream Apply topically once. (Patient not taking: Reported on 05/11/2015) 60 g 0    ROS: History obtained from the patient  General ROS: negative for - chills, fatigue, fever, night sweats, weight gain or weight loss Psychological ROS: negative for - behavioral disorder, hallucinations, memory  difficulties, mood swings or suicidal ideation Ophthalmic ROS: negative for - blurry vision, double vision, eye pain or loss of vision ENT ROS: negative for - epistaxis, nasal discharge, oral lesions, sore throat, tinnitus or vertigo Allergy and Immunology ROS: negative for - hives or itchy/watery eyes Hematological and Lymphatic ROS: negative for - bleeding problems, bruising or swollen lymph nodes Endocrine ROS: negative for - galactorrhea, hair pattern changes, polydipsia/polyuria or temperature intolerance Respiratory ROS: negative for - cough, hemoptysis, shortness of breath or wheezing Cardiovascular ROS: negative for - chest pain, dyspnea on exertion, edema or irregular heartbeat Gastrointestinal ROS: as noted in HPI Genito-Urinary ROS: negative for - dysuria, hematuria, incontinence or urinary frequency/urgency Musculoskeletal ROS: negative for - joint swelling or muscular weakness Neurological ROS: as noted in HPI Dermatological ROS: negative for rash and skin lesion changes  Physical Examination: Blood pressure 132/60, pulse 78, temperature 98.4 F (36.9 C), temperature source Oral, resp. rate 22, height 6' 1"  (1.854 m), weight 138.8 kg (306 lb), SpO2 97 %.  HEENT-  Normocephalic, no lesions, without obvious abnormality.  Normal external eye and conjunctiva.  Normal TM's bilaterally.  Normal auditory canals and external ears. Normal external nose, mucus membranes and septum.  Normal pharynx. Neck supple with no masses, nodes, nodules or enlargement. Cardiovascular - regular rate and rhythm, S1, S2 normal, no murmur, click, rub or gallop Lungs - chest clear, no wheezing, rales, normal symmetric air entry Abdomen - soft, non-tender; bowel sounds normal; no masses,  no organomegaly Extremities - no joint deformities, effusion, or inflammation, no edema  and no skin discoloration  Neurologic Examination: Mental Status: Alert, oriented, flat affect.  Speech fluent without evidence of  aphasia. Able to follow commands without difficulty. Cranial Nerves: II-Visual fields were normal. III/IV/VI-Pupils were equal and reacted normally to light. Extraocular movements were full and conjugate.    V/VII-no facial numbness and no facial weakness. VIII-normal. X-normal speech and symmetrical palatal movement. XI: trapezius strength/neck flexion strength normal bilaterally XII-midline tongue extension with normal strength. Motor: Normal strength proximally and distally in upper and lower extremities, as well as normal muscle tone throughout. Sensory: Absent sensation to all modalities in lower extremities. Deep Tendon Reflexes: 1+ and symmetric at the knees and 2+ and symmetric at ankles. Plantars: Flexor bilaterally Cerebellar: Normal finger-to-nose testing. Carotid auscultation: Normal  Results for orders placed or performed during the hospital encounter of 05/11/15 (from the past 48 hour(s))  Lipase, blood     Status: None   Collection Time: 05/11/15  2:52 PM  Result Value Ref Range   Lipase 25 22 - 51 U/L  Comprehensive metabolic panel     Status: Abnormal   Collection Time: 05/11/15  2:52 PM  Result Value Ref Range   Sodium 141 135 - 145 mmol/L   Potassium 3.7 3.5 - 5.1 mmol/L   Chloride 109 101 - 111 mmol/L   CO2 28 22 - 32 mmol/L   Glucose, Bld 93 65 - 99 mg/dL   BUN <5 (L) 6 - 20 mg/dL   Creatinine, Ser 0.80 0.61 - 1.24 mg/dL   Calcium 9.1 8.9 - 10.3 mg/dL   Total Protein 6.6 6.5 - 8.1 g/dL   Albumin 4.1 3.5 - 5.0 g/dL   AST 31 15 - 41 U/L   ALT 37 17 - 63 U/L   Alkaline Phosphatase 55 38 - 126 U/L   Total Bilirubin 0.6 0.3 - 1.2 mg/dL   GFR calc non Af Amer >60 >60 mL/min   GFR calc Af Amer >60 >60 mL/min    Comment: (NOTE) The eGFR has been calculated using the CKD EPI equation. This calculation has not been validated in all clinical situations. eGFR's persistently <60 mL/min signify possible Chronic Kidney Disease.    Anion gap 4 (L) 5 - 15  CBC      Status: None   Collection Time: 05/11/15  2:52 PM  Result Value Ref Range   WBC 7.3 4.0 - 10.5 K/uL   RBC 5.08 4.22 - 5.81 MIL/uL   Hemoglobin 15.2 13.0 - 17.0 g/dL   HCT 45.3 39.0 - 52.0 %   MCV 89.2 78.0 - 100.0 fL   MCH 29.9 26.0 - 34.0 pg   MCHC 33.6 30.0 - 36.0 g/dL   RDW 12.8 11.5 - 15.5 %   Platelets 250 150 - 400 K/uL  Acetaminophen level     Status: Abnormal   Collection Time: 05/11/15  4:50 PM  Result Value Ref Range   Acetaminophen (Tylenol), Serum <10 (L) 10 - 30 ug/mL    Comment:        THERAPEUTIC CONCENTRATIONS VARY SIGNIFICANTLY. A RANGE OF 10-30 ug/mL MAY BE AN EFFECTIVE CONCENTRATION FOR MANY PATIENTS. HOWEVER, SOME ARE BEST TREATED AT CONCENTRATIONS OUTSIDE THIS RANGE. ACETAMINOPHEN CONCENTRATIONS >150 ug/mL AT 4 HOURS AFTER INGESTION AND >50 ug/mL AT 12 HOURS AFTER INGESTION ARE OFTEN ASSOCIATED WITH TOXIC REACTIONS.   Salicylate level     Status: None   Collection Time: 05/11/15  4:50 PM  Result Value Ref Range   Salicylate Lvl <5.7 2.8 - 30.0  mg/dL  Ethanol     Status: None   Collection Time: 05/11/15  4:50 PM  Result Value Ref Range   Alcohol, Ethyl (B) <5 <5 mg/dL    Comment:        LOWEST DETECTABLE LIMIT FOR SERUM ALCOHOL IS 5 mg/dL FOR MEDICAL PURPOSES ONLY   Ammonia     Status: Abnormal   Collection Time: 05/11/15  6:00 PM  Result Value Ref Range   Ammonia 46 (H) 9 - 35 umol/L  Urinalysis, Routine w reflex microscopic (not at [facility])     Status: Abnormal   Collection Time: 05/11/15  6:59 PM  Result Value Ref Range   Color, Urine AMBER (A) YELLOW    Comment: BIOCHEMICALS MAY BE AFFECTED BY COLOR   APPearance CLOUDY (A) CLEAR   Specific Gravity, Urine 1.019 1.005 - 1.030   pH 6.0 5.0 - 8.0   Glucose, UA NEGATIVE NEGATIVE mg/dL   Hgb urine dipstick NEGATIVE NEGATIVE   Bilirubin Urine NEGATIVE NEGATIVE   Ketones, ur NEGATIVE NEGATIVE mg/dL   Protein, ur NEGATIVE NEGATIVE mg/dL   Urobilinogen, UA 0.2 0.0 - 1.0 mg/dL   Nitrite NEGATIVE  NEGATIVE   Leukocytes, UA SMALL (A) NEGATIVE  Urine rapid drug screen (hosp performed)     Status: Abnormal   Collection Time: 05/11/15  6:59 PM  Result Value Ref Range   Opiates NONE DETECTED NONE DETECTED   Cocaine NONE DETECTED NONE DETECTED   Benzodiazepines NONE DETECTED NONE DETECTED   Amphetamines NONE DETECTED NONE DETECTED   Tetrahydrocannabinol POSITIVE (A) NONE DETECTED   Barbiturates NONE DETECTED NONE DETECTED    Comment:        DRUG SCREEN FOR MEDICAL PURPOSES ONLY.  IF CONFIRMATION IS NEEDED FOR ANY PURPOSE, NOTIFY LAB WITHIN 5 DAYS.        LOWEST DETECTABLE LIMITS FOR URINE DRUG SCREEN Drug Class       Cutoff (ng/mL) Amphetamine      1000 Barbiturate      200 Benzodiazepine   270 Tricyclics       350 Opiates          300 Cocaine          300 THC              50   Urine microscopic-add on     Status: Abnormal   Collection Time: 05/11/15  6:59 PM  Result Value Ref Range   Squamous Epithelial / LPF FEW (A) RARE   WBC, UA 3-6 <3 WBC/hpf   Urine-Other MUCOUS PRESENT   Comprehensive metabolic panel     Status: Abnormal   Collection Time: 05/12/15  5:09 AM  Result Value Ref Range   Sodium 140 135 - 145 mmol/L   Potassium 3.7 3.5 - 5.1 mmol/L   Chloride 105 101 - 111 mmol/L   CO2 28 22 - 32 mmol/L   Glucose, Bld 86 65 - 99 mg/dL   BUN 6 6 - 20 mg/dL   Creatinine, Ser 0.98 0.61 - 1.24 mg/dL   Calcium 8.8 (L) 8.9 - 10.3 mg/dL   Total Protein 5.9 (L) 6.5 - 8.1 g/dL   Albumin 3.7 3.5 - 5.0 g/dL   AST 21 15 - 41 U/L   ALT 33 17 - 63 U/L   Alkaline Phosphatase 50 38 - 126 U/L   Total Bilirubin 0.8 0.3 - 1.2 mg/dL   GFR calc non Af Amer >60 >60 mL/min   GFR calc Af Amer >60 >60 mL/min  Comment: (NOTE) The eGFR has been calculated using the CKD EPI equation. This calculation has not been validated in all clinical situations. eGFR's persistently <60 mL/min signify possible Chronic Kidney Disease.    Anion gap 7 5 - 15  CBC     Status: None   Collection  Time: 05/12/15  5:09 AM  Result Value Ref Range   WBC 6.2 4.0 - 10.5 K/uL   RBC 4.54 4.22 - 5.81 MIL/uL   Hemoglobin 13.8 13.0 - 17.0 g/dL   HCT 41.5 39.0 - 52.0 %   MCV 91.4 78.0 - 100.0 fL   MCH 30.4 26.0 - 34.0 pg   MCHC 33.3 30.0 - 36.0 g/dL   RDW 13.2 11.5 - 15.5 %   Platelets 232 150 - 400 K/uL   Ct Head Wo Contrast  05/11/2015   CLINICAL DATA:  Confusion, numbness, vomiting  EXAM: CT HEAD WITHOUT CONTRAST  TECHNIQUE: Contiguous axial images were obtained from the base of the skull through the vertex without intravenous contrast.  COMPARISON:  None.  FINDINGS: No skull fracture is noted. Paranasal sinuses and mastoid air cells are unremarkable. No intracranial hemorrhage, mass effect or midline shift. No acute infarction. No hydrocephalus. No mass lesion is noted on this unenhanced scan.  IMPRESSION: No acute intracranial abnormality.   Electronically Signed   By: Lahoma Crocker M.D.   On: 05/11/2015 17:49    Assessment/Plan 24 year old man presenting with primarily sensory ranges involving lower extremities and distal upper extremities without demonstrable weakness and lower extremity reflexes intact. Guillain-Barr syndrome is somewhat unlikely with no motor involvement and intact knee and ankle reflexes, but cannot be completely ruled out.  Plan: 1. Lumbar puncture under fluoroscopy as planned 2. If CSF protein is elevated since of elevated WBC count, patient will be treated as having early manifestations of Guillain-Barr syndrome with IVIG 5 days. 3. Physical therapy consult  We will continue to follow this patient with you  C.R. Nicole Kindred, MD [facility] [phone]  05/12/2015, 9:48 AM  Addendum: CSF protein was elevated (62 mg percent) with normal WBC count (2). 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 We will continue to follow this patient with you.  CR Prudencio Burly.D.

## 2015-05-13 ENCOUNTER — Inpatient Hospital Stay (HOSPITAL_COMMUNITY): Payer: Medicaid Other

## 2015-05-13 DIAGNOSIS — G610 Guillain-Barre syndrome: Secondary | ICD-10-CM | POA: Diagnosis not present

## 2015-05-13 DIAGNOSIS — R51 Headache: Secondary | ICD-10-CM | POA: Diagnosis not present

## 2015-05-13 DIAGNOSIS — G959 Disease of spinal cord, unspecified: Secondary | ICD-10-CM

## 2015-05-13 DIAGNOSIS — Z0189 Encounter for other specified special examinations: Secondary | ICD-10-CM | POA: Diagnosis not present

## 2015-05-13 DIAGNOSIS — G4700 Insomnia, unspecified: Secondary | ICD-10-CM | POA: Diagnosis not present

## 2015-05-13 DIAGNOSIS — E669 Obesity, unspecified: Secondary | ICD-10-CM | POA: Diagnosis present

## 2015-05-13 DIAGNOSIS — R112 Nausea with vomiting, unspecified: Secondary | ICD-10-CM | POA: Diagnosis present

## 2015-05-13 DIAGNOSIS — D72829 Elevated white blood cell count, unspecified: Secondary | ICD-10-CM | POA: Diagnosis present

## 2015-05-13 DIAGNOSIS — Z6841 Body Mass Index (BMI) 40.0 and over, adult: Secondary | ICD-10-CM | POA: Diagnosis not present

## 2015-05-13 DIAGNOSIS — Z886 Allergy status to analgesic agent status: Secondary | ICD-10-CM | POA: Diagnosis not present

## 2015-05-13 DIAGNOSIS — H538 Other visual disturbances: Secondary | ICD-10-CM | POA: Diagnosis not present

## 2015-05-13 DIAGNOSIS — F419 Anxiety disorder, unspecified: Secondary | ICD-10-CM | POA: Diagnosis present

## 2015-05-13 DIAGNOSIS — F329 Major depressive disorder, single episode, unspecified: Secondary | ICD-10-CM | POA: Diagnosis present

## 2015-05-13 DIAGNOSIS — R200 Anesthesia of skin: Secondary | ICD-10-CM | POA: Diagnosis not present

## 2015-05-13 DIAGNOSIS — K5909 Other constipation: Secondary | ICD-10-CM | POA: Diagnosis not present

## 2015-05-13 DIAGNOSIS — F17210 Nicotine dependence, cigarettes, uncomplicated: Secondary | ICD-10-CM | POA: Diagnosis present

## 2015-05-13 DIAGNOSIS — T40605A Adverse effect of unspecified narcotics, initial encounter: Secondary | ICD-10-CM | POA: Diagnosis not present

## 2015-05-13 DIAGNOSIS — Z823 Family history of stroke: Secondary | ICD-10-CM | POA: Diagnosis not present

## 2015-05-13 DIAGNOSIS — Z809 Family history of malignant neoplasm, unspecified: Secondary | ICD-10-CM | POA: Diagnosis not present

## 2015-05-13 DIAGNOSIS — Z720 Tobacco use: Secondary | ICD-10-CM | POA: Diagnosis not present

## 2015-05-13 DIAGNOSIS — R29898 Other symptoms and signs involving the musculoskeletal system: Secondary | ICD-10-CM | POA: Diagnosis not present

## 2015-05-13 LAB — CBC
HEMATOCRIT: 41.8 % (ref 39.0–52.0)
Hemoglobin: 13.7 g/dL (ref 13.0–17.0)
MCH: 29.7 pg (ref 26.0–34.0)
MCHC: 32.8 g/dL (ref 30.0–36.0)
MCV: 90.7 fL (ref 78.0–100.0)
PLATELETS: 205 10*3/uL (ref 150–400)
RBC: 4.61 MIL/uL (ref 4.22–5.81)
RDW: 13.2 % (ref 11.5–15.5)
WBC: 5.1 10*3/uL (ref 4.0–10.5)

## 2015-05-13 LAB — BASIC METABOLIC PANEL
ANION GAP: 3 — AB (ref 5–15)
BUN: 8 mg/dL (ref 6–20)
CALCIUM: 8.6 mg/dL — AB (ref 8.9–10.3)
CO2: 28 mmol/L (ref 22–32)
CREATININE: 0.93 mg/dL (ref 0.61–1.24)
Chloride: 107 mmol/L (ref 101–111)
Glucose, Bld: 92 mg/dL (ref 65–99)
Potassium: 3.8 mmol/L (ref 3.5–5.1)
SODIUM: 138 mmol/L (ref 135–145)

## 2015-05-13 MED ORDER — MORPHINE SULFATE (PF) 2 MG/ML IV SOLN
2.0000 mg | INTRAVENOUS | Status: DC | PRN
  Administered 2015-05-13 – 2015-05-15 (×9): 2 mg via INTRAVENOUS
  Filled 2015-05-13 (×10): qty 1

## 2015-05-13 NOTE — Progress Notes (Addendum)
[facility]                                                                              Patient Demographics  Donald Mckay, is a 24 y.o. male, DOB - 1990/09/22, ZOX:[id number]  Admit date - 05/11/2015   Admitting Physician Eduard Clos, MD  Outpatient Primary MD for the patient is No PCP Per Patient  LOS - 2   Chief Complaint  Patient presents with  . Emesis  . Confusion        Brief HPI   [name] is a 24 y.o. male with no significant past medical history presents to the ER because of increasing numbness of the extremities progressing upwards. Patient's symptoms started on 8/21 around 11 AM. Patient's initial symptoms were numbness around his foot which has progressed to his upper extremities at this time. Patient states she also has sensation loss. Denies any incontinence of urine or bowels.  patient did report nausea and vomiting for the last 2 days prior to admission. On exam patient has poor deep tendon reflexes. Denied any recent fever chills or diarrhea episodes. Denied any recent sick contacts or vaccinations. On-call neurologist Dr. Hosie Poisson was consulted. Lumbar puncture was attempted in the ER but was unsuccessful and fluoroscopic guided lumbar puncture has been ordered. CT head was unremarkable. Patient denied any difficulty speaking or swallowing or breathing.    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 Patient examined, complaining of  headache, blurry vision for last 1 hour, difficulty reading. On examination, patient denied any double vision, had no diplopia, EOMI. blurry vision + but was able to tell me the correct fingers 4/6 times.  Discussed with neurology, Dr. Roseanne Reno, recommended to do MRI of the brain only if patient complains of diplopia. Otherwise, no need of MRI brain.   Active Problems:   Tobacco use disorder - Consult strongly for smoking cessation, chest x-ray clear  Code Status: Full code   Family Communication: Discussed in detail with the patient, all imaging results, lab results explained to the patient    Disposition Plan:   Time Spent in minutes  25 minutes  Procedures  LP  Consults   neuro  DVT Prophylaxis  SCD's  Medications  Scheduled Meds: . Immune Globulin 10%  400 mg/kg Intravenous Q24 Hr x 5   Continuous Infusions:   PRN Meds:.acetaminophen, morphine injection, oxyCODONE-acetaminophen, senna-docusate   Antibiotics   Anti-infectives    None        Subjective:   Donald Mckay was seen and examined today. Per patient, no significant improvement in the numbness and weakness in both legs.  Denies any shortness of breath. Patient denies dizziness,  chest pain, shortness of breath, abdominal pain, N/V/D/C.No fevers  Objective:   Blood pressure 151/76, pulse 89, temperature 98.2 F (36.8 C), temperature source Oral, resp. rate 20, height 6\' 1"  (1.854 m), weight 138.8 kg (306 lb), SpO2 100 %.  Wt Readings from Last 3 Encounters:  05/11/15 138.8 kg (306 lb)     Intake/Output Summary (Last 24 hours) at 05/13/15 1054 Last data filed at 05/12/15 1850  Gross per 24 hour  Intake  887.5 ml  Output    825 ml  Net   62.5 ml    Exam  General: Alert and oriented x 3, NAD  HEENT:  PERRLA, EOMI  Neck: Supple, no JVD, no masses  CVS: S1 S2 clear, RRR  Respiratory: CTAB  Abdomen: Soft, NT, ND, NBS  Ext: no cyanosis clubbing or edema  Neuro: AAOx3, Cr N's II- XII.  dec deep tendon reflexes, poor sensation of lower extremities   Skin: No rashes  Psych: Normal affect and demeanor, alert and oriented x3    Data Review   Micro Results Recent Results (from the past 240 hour(s))  Fungus Culture with Smear     Status: None (Preliminary result)   Collection Time: 05/12/15 11:35 AM  Result Value Ref Range Status   Specimen Description CSF  Final   Special Requests NONE  Final   Fungal Smear   Final    NO YEAST OR FUNGAL ELEMENTS SEEN Performed at [facility]    Culture   Final    CULTURE IN PROGRESS FOR FOUR WEEKS Performed at [facility]    Report Status PENDING  Incomplete  Gram stain     Status: None   Collection Time: 05/12/15 11:35 AM  Result Value Ref Range Status   Specimen Description CSF  Final   Special Requests NONE  Final   Gram Stain   Final    CYTOSPIN WBC PRESENT,BOTH PMN AND MONONUCLEAR NO ORGANISMS SEEN Gram Stain Report Called to,Read Back By and Verified With: EThad Ranger RN AT 1235 ON 08.23.16 BYSHUEA    Report Status 05/12/2015 FINAL  Final  CSF culture     Status: None (Preliminary result)   Collection Time: 05/12/15 11:35 AM  Result Value Ref Range Status   Specimen Description CSF  Final   Special Requests NONE  Final   Gram Stain   Final    WBC PRESENT,BOTH PMN AND MONONUCLEAR NO ORGANISMS SEEN CYTOSPIN SMEAR Gram Stain Report Called to,Read Back By and Verified With: EVida Rigger at 1235 on [id number] by [facility]    Culture   Final    NO GROWTH < 24 HOURS Performed at [facility]    Report Status PENDING  Incomplete    Radiology Reports Dg Chest 2 View  05/12/2015   CLINICAL DATA:  Weakness and increased numbness of the extremities.  EXAM: CHEST  2 VIEW  COMPARISON:  01/28/2014  FINDINGS: Cardiomediastinal silhouette is normal. Mediastinal contours appear intact.  There is no evidence of focal airspace consolidation, pleural effusion or pneumothorax.  Osseous structures are without  acute abnormality. Soft tissues are grossly normal.  IMPRESSION: No radiographic evidence of acute cardiopulmonary abnormality.   Electronically Signed   By: Ted Mcalpine M.D.   On: 05/12/2015 10:21   Ct Head Wo Contrast  05/11/2015   CLINICAL DATA:  Confusion, numbness, vomiting  EXAM: CT HEAD WITHOUT CONTRAST  TECHNIQUE: Contiguous axial images were obtained from the base of the skull through the vertex without intravenous  contrast.  COMPARISON:  None.  FINDINGS: No skull fracture is noted. Paranasal sinuses and mastoid air cells are unremarkable. No intracranial hemorrhage, mass effect or midline shift. No acute infarction. No hydrocephalus. No mass lesion is noted on this unenhanced scan.  IMPRESSION: No acute intracranial abnormality.   Electronically Signed   By: Natasha Mead M.D.   On: 05/11/2015 17:49   Dg Fluoro Guide Lumbar Puncture  05/12/2015   CLINICAL DATA:  Bilateral leg and arm weakness.  Initial encounter  EXAM: DIAGNOSTIC LUMBAR PUNCTURE UNDER FLUOROSCOPIC GUIDANCE  FLUOROSCOPY TIME:  Radiation Exposure Index (as provided by the fluoroscopic device):  If the device does not provide the exposure index:  Fluoroscopy Time (in minutes and seconds):  1 minutes 7 seconds  Number of Acquired Images:  2  PROCEDURE: Informed consent was obtained from the patient prior to the procedure, including potential complications of headache, allergy, and pain. With the patient prone, the lower back was prepped with Betadine. 1% Lidocaine was used for local anesthesia. Lumbar puncture was performed at the L5-S1 level using a 22 gauge needle with return of clear CSF with an opening pressure of 10 cm water. 7.5 ml of CSF were obtained for laboratory studies. The patient tolerated the procedure well and there were no apparent complications.  IMPRESSION: Successful lumbar puncture.   Electronically Signed   By: Genevive Bi M.D.   On: 05/12/2015 12:02    CBC  Recent Labs Lab 05/11/15 1452  05/12/15 0509 05/13/15 0444  WBC 7.3 6.2 5.1  HGB 15.2 13.8 13.7  HCT 45.3 41.5 41.8  PLT 250 232 205  MCV 89.2 91.4 90.7  MCH 29.9 30.4 29.7  MCHC 33.6 33.3 32.8  RDW 12.8 13.2 13.2    Chemistries   Recent Labs Lab 05/11/15 1452 05/12/15 0509 05/13/15 0444  NA 141 140 138  K 3.7 3.7 3.8  CL 109 105 107  CO2 28 28 28   GLUCOSE 93 86 92  BUN <5* 6 8  CREATININE 0.80 0.98 0.93  CALCIUM 9.1 8.8* 8.6*  AST 31 21  --   ALT 37 33  --   ALKPHOS 55 50  --   BILITOT 0.6 0.8  --    ------------------------------------------------------------------------------------------------------------------ estimated creatinine clearance is 180.8 mL/min (by C-G formula based on Cr of 0.93). ------------------------------------------------------------------------------------------------------------------ No results for input(s): HGBA1C in the last 72 hours. ------------------------------------------------------------------------------------------------------------------ No results for input(s): CHOL, HDL, LDLCALC, TRIG, CHOLHDL, LDLDIRECT in the last 72 hours. ------------------------------------------------------------------------------------------------------------------ No results for input(s): TSH, T4TOTAL, T3FREE, THYROIDAB in the last 72 hours.  Invalid input(s): FREET3 ------------------------------------------------------------------------------------------------------------------ No results for input(s): VITAMINB12, FOLATE, FERRITIN, TIBC, IRON, RETICCTPCT in the last 72 hours.  Coagulation profile No results for input(s): INR, PROTIME in the last 168 hours.  No results for input(s): DDIMER in the last 72 hours.  Cardiac Enzymes No results for input(s): CKMB, TROPONINI, MYOGLOBIN in the last 168 hours.  Invalid input(s): CK ------------------------------------------------------------------------------------------------------------------ Invalid input(s): POCBNP  No results  for input(s): GLUCAP in the last 72 hours.   RAI,RIPUDEEP M.D. [facility] 05/13/2015, 10:54 AM  Pager: [phone] Between 7am to 7pm - call Pager - [phone]  After 7pm go to [email or web address] - password [email or web address]  Call night coverage person covering after 7pm

## 2015-05-13 NOTE — Progress Notes (Signed)
OT Cancellation Note  Patient Details Name: Donald Anglin Melle Jr. MRN: [id number] DOB: [birth year]   Cancelled Treatment:    Reason Eval/Treat Not Completed: Other (comment).  Pt with a headache and worse when HOB raises.  Will check back.  Emilygrace Grothe 05/13/2015, 2:20 PM  Marica Otter, OTR/L [phone] 05/13/2015

## 2015-05-13 NOTE — Progress Notes (Signed)
PT Cancellation Note  Patient Details Name: Donald Biskup Gilardi Jr. MRN: [id number] DOB: February 22, 1991   Cancelled Treatment:    Reason Eval/Treat Not Completed: Medical issues which prohibited therapy (headache per OT note, also pending MRI cervical and thoracic spine )   Virgen Belland,KATHrine E 05/13/2015, 2:47 PM Zenovia Jarred, PT, DPT 05/13/2015 Pager: [phone]

## 2015-05-13 NOTE — Progress Notes (Signed)
Pts Negative Inspiratory force was 50 cmh20 fvc was 2l

## 2015-05-13 NOTE — Progress Notes (Signed)
NIF -30 and VC 2.85L.  Pt demonstrated with good effort and technique.  RT to monitor and assess as needed.

## 2015-05-13 NOTE — Progress Notes (Signed)
Dr. Isidoro Donning at bedside assessing patient. Dr. Isidoro Donning paging neurology. Will continue to monitor patient.

## 2015-05-13 NOTE — Progress Notes (Signed)
Upon neurological check, patient complains of blurred vision. .No double vision. No other changes with neurological checks since 1200. Vital signs stable:  98.9; 145/82; 78; 22; O2 sat. 92 % of room air. Dr. Isidoro Donning paged. Will continue to monitor patient.

## 2015-05-13 NOTE — Progress Notes (Signed)
Subjective: Patient had no new complaints. He has not experienced a change in distal upper extremity and lower extremity numbness. He has not experienced shortness of breath. He said no change in speech or swallowing.  Objective: Current vital signs: BP 151/76 mmHg  Pulse 89  Temp(Src) 98.2 F (36.8 C) (Oral)  Resp 20  Ht  (1.854 m)  Wt 138.8 kg (306 lb)  BMI 40.38 kg/m2  SpO2 100%  Neurologic Exam: Patient was alert and in no acute distress. Affect was flat. Mental status was otherwise unremarkable. Extraocular movements were full and conjugate. No facial weakness was noted. Strength of neck flexors and extensors was normal. Strength of upper and lower extremities was normal proximally and distally. Deep tendon reflexes in lower extremities were intact and unchanged, including ankle reflexes. Sensory findings were unchanged with absent sensation to all modalities in lower extremities.  FVC today was 2 L, and NIF -40.  Medications: I have reviewed the patient's current medications.  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 Immunotherapy with IVIG has started. Patient will receive the second of 5 planned doses today.  I will obtain MRI studies of the cervical and thoracic spine to rule out a possible cord lesion. Physical and occupational therapy intervention to continue.  C.R. Roseanne Reno, MD [facility] [phone]  05/13/2015  10:17 AM

## 2015-05-14 DIAGNOSIS — R200 Anesthesia of skin: Secondary | ICD-10-CM

## 2015-05-14 DIAGNOSIS — R29898 Other symptoms and signs involving the musculoskeletal system: Secondary | ICD-10-CM

## 2015-05-14 LAB — BASIC METABOLIC PANEL
ANION GAP: 5 (ref 5–15)
BUN: 8 mg/dL (ref 6–20)
CO2: 29 mmol/L (ref 22–32)
Calcium: 8.6 mg/dL — ABNORMAL LOW (ref 8.9–10.3)
Chloride: 105 mmol/L (ref 101–111)
Creatinine, Ser: 0.92 mg/dL (ref 0.61–1.24)
GFR calc Af Amer: >60 mL/min (ref >60)
Glucose, Bld: 90 mg/dL (ref 65–99)
POTASSIUM: 3.7 mmol/L (ref 3.5–5.1)
SODIUM: 139 mmol/L (ref 135–145)

## 2015-05-14 LAB — CBC
HCT: 40.9 % (ref 39.0–52.0)
Hemoglobin: 13.6 g/dL (ref 13.0–17.0)
MCH: 30.3 pg (ref 26.0–34.0)
MCHC: 33.3 g/dL (ref 30.0–36.0)
MCV: 91.1 fL (ref 78.0–100.0)
PLATELETS: 230 10*3/uL (ref 150–400)
RBC: 4.49 MIL/uL (ref 4.22–5.81)
RDW: 13.3 % (ref 11.5–15.5)
WBC: 4.5 10*3/uL (ref 4.0–10.5)

## 2015-05-14 MED ORDER — INFLUENZA VAC SPLIT QUAD 0.5 ML IM SUSY: 0.5000 mL | PREFILLED_SYRINGE | INTRAMUSCULAR | Status: DC

## 2015-05-14 NOTE — Progress Notes (Signed)
NIF -30 and FVC 2.7L, Pt demonstrated with good effort and technique.  RT to monitor and assess as needed.

## 2015-05-14 NOTE — Progress Notes (Signed)
Rehab Admissions Coordinator Note:  Patient was screened by Donald Mckay for appropriateness for an Inpatient Acute Rehab Consult per PT recommendation.  At this time, we are recommending Inpatient Rehab consult. I will contact Dr. Isidoro Donning for order.   Donald Mckay 05/14/2015, 1:12 PM  I can be reached at [phone].

## 2015-05-14 NOTE — Progress Notes (Signed)
Subjective: Patient is complaining of severe headache. He has not experienced a change in sensory symptoms involving lower extremities. He has not expressed change in strength of upper and lower extremities.  Objective: Current vital signs: BP 150/82 mmHg  Pulse 81  Temp(Src) 97.7 F (36.5 C) (Oral)  Resp 17  Ht  (1.854 m)  Wt 138.8 kg (306 lb)  BMI 40.38 kg/m2  SpO2 99%  Neurologic Exam: Patient was alert and did not appear to be a severe distress. His mental status was normal except for flat affect. Extraocular movements were full and conjugate. No facial weakness was noted. Strength of upper and lower extremities was normal and symmetrical proximally and distally. Deep tendon reflexes were intact and unchanged, including ankle reflexes. Sensory changes with absence of resection of any modalities sensation of lower extremities remains unchanged.  Medications: I have reviewed the patient's current medications.  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  Recommendations: 1. No change in current immunotherapy with IVIG. Patient is scheduled to receive his third of 5 planned treatments today. 2. Physical therapy and occupational therapy intervention. 3. Allergies medication as needed.  We will continue to follow this patient with you.  C.R. Roseanne Reno, MD [facility] [phone]  05/14/2015  12:03 PM

## 2015-05-14 NOTE — Progress Notes (Signed)
[facility]                                                                              Patient Demographics  Donald Mckay, is a 24 y.o. male, DOB - Apr 07, 1991, ZOX:[id number]  Admit date - 05/11/2015   Admitting Physician Eduard Clos, MD  Outpatient Primary MD for the patient is No PCP Per Patient  LOS - 3   Chief Complaint  Patient presents with  . Emesis  . Confusion        Brief HPI   Donald Carnes Frost [name] is a 24 y.o. male with no significant past medical history presents to the ER because of increasing numbness of the extremities progressing upwards. Patient's symptoms started on 8/21 around 11 AM. Patient's initial symptoms were numbness around his foot which has progressed to his upper extremities at this time. Patient states she also has sensation loss. Denies any incontinence of urine or bowels.  patient did report nausea and vomiting for the last 2 days prior to admission. On exam patient has poor deep tendon reflexes. Denied any recent fever chills or diarrhea episodes. Denied any recent sick contacts or vaccinations. On-call neurologist Dr. Hosie Poisson was consulted. Lumbar puncture was attempted in the ER but was unsuccessful and fluoroscopic guided lumbar puncture has been ordered. CT head was unremarkable. Patient denied any difficulty speaking or swallowing or breathing.    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 - Spinal tap done under fluoroscopy on 8/23 showed CSF protein elevated with normal WBC count, indicative of GBS - Neurology started the patient on IVIG for 5 days - Continue neuro checks, NIF - MRI of the cervical and thoracic spine showed no cord lesion  - Currently patient states he has no improvement since admission, awaiting neurology recommendations  Active Problems:   Tobacco use disorder - Consult strongly for smoking  cessation, chest x-ray clear  Code Status: Full code   Family Communication: Discussed in detail with the patient, all imaging results, lab results explained to the patient    Disposition Plan:   Time Spent in minutes  25 minutes  Procedures  LP  Consults   neuro  DVT Prophylaxis  SCD's  Medications  Scheduled Meds: . Immune Globulin 10%  400 mg/kg Intravenous Q24 Hr x 5   Continuous Infusions:   PRN Meds:.acetaminophen, morphine injection, oxyCODONE-acetaminophen, senna-docusate   Antibiotics   Anti-infectives    None        Subjective:   Bright Spielmann was seen and examined today. Complaining of headache, states no improvement in numbness and weakness in the lower extremities. He states that he walked to the bathroom however almost fell.  Denies any shortness of breath. Patient denies dizziness, chest pain, shortness of breath, abdominal pain, N/V/D/C.No fevers  Objective:   Blood pressure 150/82, pulse 81, temperature 97.7 F (36.5 C), temperature source Oral, resp. rate 17, height 6\' 1"  (1.854 m), weight 138.8 kg (306 lb), SpO2 99 %.  Wt Readings from Last 3 Encounters:  05/11/15 138.8 kg (306 lb)  05/13/15 138.801 kg (  306 lb)  05/13/15 138.801 kg (306 lb)     Intake/Output Summary (Last 24 hours) at 05/14/15 1046 Last data filed at 05/14/15 0416  Gross per 24 hour  Intake    720 ml  Output   1001 ml  Net   -281 ml    Exam  General: Alert and oriented x 3, NAD  HEENT:  PERRLA, EOMI  Neck: Supple, no JVD, no masses  CVS: S1 S2 clear, RRR  Respiratory: Clear to auscultation, no wheezing or rhonchi  Abdomen: Soft, NT, ND, NBS  Ext: no cyanosis clubbing or edema  Neuro: poor sensation of lower extremities   Skin: No rashes  Psych: Normal affect and demeanor, alert and oriented x3    Data Review   Micro Results Recent Results (from the past 240 hour(s))  Fungus Culture with Smear     Status: None (Preliminary result)    Collection Time: 05/12/15 11:35 AM  Result Value Ref Range Status   Specimen Description CSF  Final   Special Requests NONE  Final   Fungal Smear   Final    NO YEAST OR FUNGAL ELEMENTS SEEN Performed at [facility]    Culture   Final    CULTURE IN PROGRESS FOR FOUR WEEKS Performed at [facility]    Report Status PENDING  Incomplete  Gram stain     Status: None   Collection Time: 05/12/15 11:35 AM  Result Value Ref Range Status   Specimen Description CSF  Final   Special Requests NONE  Final   Gram Stain   Final    CYTOSPIN WBC PRESENT,BOTH PMN AND MONONUCLEAR NO ORGANISMS SEEN Gram Stain Report Called to,Read Back By and Verified With: EThad Ranger RN AT 1235 ON 08.23.16 BYSHUEA    Report Status 05/12/2015 FINAL  Final  CSF culture     Status: None (Preliminary result)   Collection Time: 05/12/15 11:35 AM  Result Value Ref Range Status   Specimen Description CSF  Final   Special Requests NONE  Final   Gram Stain   Final    WBC PRESENT,BOTH PMN AND MONONUCLEAR NO ORGANISMS SEEN CYTOSPIN SMEAR Gram Stain Report Called to,Read Back By and Verified With: EVida Rigger at 1235 on [id number] by [facility]    Culture   Final    NO GROWTH < 24 HOURS Performed at [facility]    Report Status PENDING  Incomplete    Radiology Reports Dg Chest 2 View  05/12/2015   CLINICAL DATA:  Weakness and increased numbness of the extremities.  EXAM: CHEST  2 VIEW  COMPARISON:  01/28/2014  FINDINGS: Cardiomediastinal silhouette is normal. Mediastinal contours appear intact.  There is no evidence of focal airspace consolidation, pleural effusion or pneumothorax.  Osseous structures are without acute abnormality. Soft tissues are grossly normal.  IMPRESSION: No radiographic evidence of acute cardiopulmonary abnormality.   Electronically Signed   By: Ted Mcalpine M.D.   On: 05/12/2015 10:21   Ct Head Wo Contrast  05/11/2015   CLINICAL DATA:  Confusion, numbness, vomiting   EXAM: CT HEAD WITHOUT CONTRAST  TECHNIQUE: Contiguous axial images were obtained from the base of the skull through the vertex without intravenous contrast.  COMPARISON:  None.  FINDINGS: No skull fracture is noted. Paranasal sinuses and mastoid air cells are unremarkable. No intracranial hemorrhage, mass effect or midline shift. No acute infarction. No hydrocephalus. No mass lesion is noted on this unenhanced scan.  IMPRESSION: No acute intracranial abnormality.  Electronically Signed   By: Natasha Mead M.D.   On: 05/11/2015 17:49   Mr Cervical Spine Wo Contrast  05/13/2015   CLINICAL DATA:  Numbness in the right hand and in both lower extremities. Dizziness. Question Guillain-Barre syndrome.  EXAM: MRI CERVICAL AND thoracic SPINE WITHOUT CONTRAST  TECHNIQUE: Multiplanar and multiecho pulse sequences of the cervical spine, to include the craniocervical junction and cervicothoracic junction, and thoracic spine, were obtained without intravenous contrast.  COMPARISON:  None.  FINDINGS: MRI CERVICAL SPINE FINDINGS  Cervical exam is mildly degraded by motion. There are minimal non-compressive disc bulges at C4-5 and C5-6. No canal or foraminal stenosis. No abnormal cord signal. No osseous or articular focal lesion.  MRI thoracic SPINE FINDINGS  Thoracic study is also somewhat degraded by motion. Alignment of the spine is normal. No degenerative disc disease. No canal or foraminal stenosis. No signal abnormality of the spinal cord. The distal cord and conus are normal with conus tip at L1.  Guillain-Barre syndrome can present with the only finding of nerve root enhancement of the cauda equina. That cannot be evaluated on this study.  IMPRESSION: Motion degraded but otherwise unremarkable studies of the cervical and thoracic spine. No compressive pathology. No finding of Guillain-Barre syndrome on this nonenhanced study. Often, the only imaging sign of this disease is enhancement of the nerve roots of the cauda equina,  which cannot be evaluated on this study.   Electronically Signed   By: Paulina Fusi M.D.   On: 05/13/2015 19:57   Mr Thoracic Spine Wo Contrast  05/13/2015   CLINICAL DATA:  Numbness in the right hand and in both lower extremities. Dizziness. Question Guillain-Barre syndrome.  EXAM: MRI CERVICAL AND thoracic SPINE WITHOUT CONTRAST  TECHNIQUE: Multiplanar and multiecho pulse sequences of the cervical spine, to include the craniocervical junction and cervicothoracic junction, and thoracic spine, were obtained without intravenous contrast.  COMPARISON:  None.  FINDINGS: MRI CERVICAL SPINE FINDINGS  Cervical exam is mildly degraded by motion. There are minimal non-compressive disc bulges at C4-5 and C5-6. No canal or foraminal stenosis. No abnormal cord signal. No osseous or articular focal lesion.  MRI thoracic SPINE FINDINGS  Thoracic study is also somewhat degraded by motion. Alignment of the spine is normal. No degenerative disc disease. No canal or foraminal stenosis. No signal abnormality of the spinal cord. The distal cord and conus are normal with conus tip at L1.  Guillain-Barre syndrome can present with the only finding of nerve root enhancement of the cauda equina. That cannot be evaluated on this study.  IMPRESSION: Motion degraded but otherwise unremarkable studies of the cervical and thoracic spine. No compressive pathology. No finding of Guillain-Barre syndrome on this nonenhanced study. Often, the only imaging sign of this disease is enhancement of the nerve roots of the cauda equina, which cannot be evaluated on this study.   Electronically Signed   By: Paulina Fusi M.D.   On: 05/13/2015 19:57   Dg Fluoro Guide Lumbar Puncture  05/12/2015   CLINICAL DATA:  Bilateral leg and arm weakness.  Initial encounter  EXAM: DIAGNOSTIC LUMBAR PUNCTURE UNDER FLUOROSCOPIC GUIDANCE  FLUOROSCOPY TIME:  Radiation Exposure Index (as provided by the fluoroscopic device):  If the device does not provide the exposure  index:  Fluoroscopy Time (in minutes and seconds):  1 minutes 7 seconds  Number of Acquired Images:  2  PROCEDURE: Informed consent was obtained from the patient prior to the procedure, including potential complications of headache, allergy, and  pain. With the patient prone, the lower back was prepped with Betadine. 1% Lidocaine was used for local anesthesia. Lumbar puncture was performed at the L5-S1 level using a 22 gauge needle with return of clear CSF with an opening pressure of 10 cm water. 7.5 ml of CSF were obtained for laboratory studies. The patient tolerated the procedure well and there were no apparent complications.  IMPRESSION: Successful lumbar puncture.   Electronically Signed   By: Genevive Bi M.D.   On: 05/12/2015 12:02    CBC  Recent Labs Lab 05/11/15 1452 05/12/15 0509 05/13/15 0444 05/14/15 0453  WBC 7.3 6.2 5.1 4.5  HGB 15.2 13.8 13.7 13.6  HCT 45.3 41.5 41.8 40.9  PLT 250 232 205 230  MCV 89.2 91.4 90.7 91.1  MCH 29.9 30.4 29.7 30.3  MCHC 33.6 33.3 32.8 33.3  RDW 12.8 13.2 13.2 13.3    Chemistries   Recent Labs Lab 05/11/15 1452 05/12/15 0509 05/13/15 0444 05/14/15 0453  NA 141 140 138 139  K 3.7 3.7 3.8 3.7  CL 109 105 107 105  CO2 28 28 28 29   GLUCOSE 93 86 92 90  BUN <5* 6 8 8   CREATININE 0.80 0.98 0.93 0.92  CALCIUM 9.1 8.8* 8.6* 8.6*  AST 31 21  --   --   ALT 37 33  --   --   ALKPHOS 55 50  --   --   BILITOT 0.6 0.8  --   --    ------------------------------------------------------------------------------------------------------------------ estimated creatinine clearance is 182.8 mL/min (by C-G formula based on Cr of 0.92). ------------------------------------------------------------------------------------------------------------------ No results for input(s): HGBA1C in the last 72 hours. ------------------------------------------------------------------------------------------------------------------ No results for input(s): CHOL, HDL,  LDLCALC, TRIG, CHOLHDL, LDLDIRECT in the last 72 hours. ------------------------------------------------------------------------------------------------------------------ No results for input(s): TSH, T4TOTAL, T3FREE, THYROIDAB in the last 72 hours.  Invalid input(s): FREET3 ------------------------------------------------------------------------------------------------------------------ No results for input(s): VITAMINB12, FOLATE, FERRITIN, TIBC, IRON, RETICCTPCT in the last 72 hours.  Coagulation profile No results for input(s): INR, PROTIME in the last 168 hours.  No results for input(s): DDIMER in the last 72 hours.  Cardiac Enzymes No results for input(s): CKMB, TROPONINI, MYOGLOBIN in the last 168 hours.  Invalid input(s): CK ------------------------------------------------------------------------------------------------------------------ Invalid input(s): POCBNP  No results for input(s): GLUCAP in the last 72 hours.   RAI,RIPUDEEP M.D. [facility] 05/14/2015, 10:46 AM  Pager: [phone] Between 7am to 7pm - call Pager - [phone]  After 7pm go to [email or web address] - password [email or web address]  Call night coverage person covering after 7pm

## 2015-05-14 NOTE — Progress Notes (Signed)
RT Note: Breathing parameters were done per MD order and results were as follows. Nif: -34, Vital Capacity: 2.9 Liters. Patient gave a great effort and used proper technique. Rt will continue to monitor.

## 2015-05-14 NOTE — Evaluation (Signed)
Occupational Therapy Evaluation Patient Details Name: Donald Spadafore Maurin Jr. 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  Pt to receive 5 doses of IVIG. MRI of the cervical and thoracic spine showed no cord lesion    Clinical Impression   Pt was admitted for the above.  He has had a bad headache since LP.  Stood earlier with PT.  OT completed evaluation at bed level, and pt performed ADL.  He presents with bil UE weakness and decreased sensation/coordination.  He will benefit from skilled OT.  Goals in acute are for min A for LB adls and mod A +2 SQPT/lateral transfer to commode.  He will need follow up OT after this venue.    Follow Up Recommendations  CIR    Equipment Recommendations  3 in 1 bedside comode (likely)    Recommendations for Other Services       Precautions / Restrictions Precautions Precautions: Fall Restrictions Weight Bearing Restrictions: No      Mobility Bed Mobility Overal bed mobility: Needs Assistance Bed Mobility: Supine to Sit;Sit to Supine Rolling: Min assist (used rail; assist to help bend RLE)         Transfers             General transfer comment: not tested    Balance Overall balance assessment: Needs assistance                                        ADL Overall ADL's : Needs assistance/impaired     Grooming: Set up;Wash/dry hands;Bed level   Upper Body Bathing: Set up;Bed level   Lower Body Bathing: Moderate assistance;Bed level;With adaptive equipment   Upper Body Dressing : Minimal assistance;Bed level   Lower Body Dressing: Total assistance;Bed level                 General ADL Comments: performed bathing and changed gown at bed level.  Pt was able to slide legs over, assist with lifting for bathing and roll to R  with min A and use of rail     Vision     Perception     Praxis      Pertinent Vitals/Pain Pain Assessment: 0-10 Pain Score: 8  Pain Location: headache Pain Descriptors / Indicators: Headache Pain Intervention(s): Limited activity within patient's tolerance;Monitored during session     Hand Dominance     Extremity/Trunk Assessment Upper Extremity Assessment Upper Extremity Assessment: Generalized weakness;RUE deficits/detail;LUE deficits/detail RUE Deficits / Details: awkward hand movements:  able to oppose and strength was grossly 3/5 hand and 3+/5 proximally c/o tingling in RUE :  did not formally assess.  Pt was able to use washcloths and squeeze reacher; however, he would need a longer one to be helpful for ADLs. LUE Deficits / Details: strength grossly 3+/5.  Pt c/o absent sensation.  Able to use washcloth   Lower Extremity Assessment PER PT Lower Extremity Assessment: RLE deficits/detail;LLE deficits/detail RLE Deficits / Details: hip flexion 2+/5, knee extension 3+/5, ankle DF 3+/5 RLE Sensation: decreased light touch;decreased proprioception RLE Coordination: decreased gross motor;decreased fine motor LLE Deficits / Details: hip flexion 2+/5, knee extension 3/5, ankle DF 3/5 LLE Sensation: decreased light touch;decreased proprioception LLE Coordination: decreased gross motor;decreased fine motor  Communication Communication Communication: No difficulties   Cognition Arousal/Alertness: Awake/alert Behavior During Therapy: Flat affect Overall Cognitive Status: Within Functional Limits for tasks assessed                     General Comments       Exercises       Shoulder Instructions      Home Living Family/patient expects to be discharged to:: Private residence Living Arrangements: Other relatives                                     Prior Functioning/Environment Level of Independence: Independent        Comments: worked  in [employer] Diagnosis: Generalized weakness   OT Problem List: Decreased strength;Decreased activity tolerance;Decreased coordination;Impaired balance (sitting and/or standing);Decreased knowledge of use of DME or AE;Pain   OT Treatment/Interventions: Self-care/ADL training;DME and/or AE instruction;Balance training;Patient/family education;Therapeutic activities    OT Goals(Current goals can be found in the care plan section) Acute Rehab OT Goals Patient Stated Goal: none stated:  agreeable to OT OT Goal Formulation: With patient Time For Goal Achievement: 05/28/15 Potential to Achieve Goals: Good ADL Goals Pt Will Perform Lower Body Bathing: with min assist;sit to/from stand;with adaptive equipment Pt Will Perform Lower Body Dressing: with min assist;with adaptive equipment;sit to/from stand (pants with reacher) Pt Will Transfer to Toilet: with mod assist;squat pivot transfer;with transfer board;bedside commode;with +2 assist Additional ADL Goal #1: Pt will be independent with bil UE strengthening program, AROM and level 1 putty  OT Frequency: Min 2X/week   Barriers to D/C:            Co-evaluation              End of Session    Activity Tolerance: Patient limited by pain Patient left: in bed;with call bell/phone within reach   Time: 3086-5784 OT Time Calculation (min): 23 min Charges:  OT General Charges $OT Visit: 1 Procedure OT Evaluation $Initial OT Evaluation Tier I: 1 Procedure OT Treatments $Self Care/Home Management : 8-22 mins G-Codes:    Wendell Fiebig May 21, 2015, 2:25 PM  Marica Otter, OTR/L [phone] 2015-05-21

## 2015-05-14 NOTE — Evaluation (Signed)
Physical Therapy Evaluation Patient Details Name: Donald Peasley Rasmus Jr. 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  Pt to receive 5 doses of IVIG. MRI of the cervical and thoracic spine showed no cord lesion   Clinical Impression  Pt admitted with above diagnosis. Pt currently with functional limitations due to the deficits listed below (see PT Problem List).  Pt will benefit from skilled PT to increase their independence and safety with mobility to allow discharge to the venue listed below.  Pt presents with sensory and strength impairments.  Pt also with coordination and proprioception impairments as well.  Pt only able to stand today with increased assist and unable to lift either LE from floor (march in place).  Pt would benefit from f/u rehab upon d/c.     Follow Up Recommendations CIR    Equipment Recommendations  Wheelchair (measurements PT)    Recommendations for Other Services       Precautions / Restrictions Precautions Precautions: Fall      Mobility  Bed Mobility Overal bed mobility: Needs Assistance Bed Mobility: Supine to Sit;Sit to Supine     Supine to sit: Supervision;HOB elevated Sit to supine: Mod assist   General bed mobility comments: uncoordinated and effortful movement, some assist for each LE onto bed  Transfers Overall transfer level: Needs assistance Equipment used: Rolling walker (2 wheeled) Transfers: Sit to/from Stand Sit to Stand: Mod assist;+2 physical assistance;+2 safety/equipment         General transfer comment: demonstrated technique for pt, verbal cues for hand placement, assist due to weakness and to stabilize, attempted marching however pt unable to lift either LE from the floor, returned to sitting for rest break and then pt reported seeing "spots" so  assisted back to supine  Ambulation/Gait                Stairs            Wheelchair Mobility    Modified Rankin (Stroke Patients Only)       Balance Overall balance assessment: Needs assistance         Standing balance support: Bilateral upper extremity supported Standing balance-Leahy Scale: Zero                               Pertinent Vitals/Pain Pain Assessment: 0-10 Pain Score: 8  Pain Location: headache Pain Descriptors / Indicators: Headache Pain Intervention(s): Monitored during session;Repositioned;Patient requesting pain meds-RN notified    Home Living Family/patient expects to be discharged to:: Private residence Living Arrangements: Other relatives               Additional Comments: pt not very contributory    Prior Function Level of Independence: Independent               Hand Dominance        Extremity/Trunk Assessment   Upper Extremity Assessment: Defer to OT evaluation           Lower Extremity Assessment: RLE deficits/detail;LLE deficits/detail RLE Deficits / Details: hip flexion 2+/5, knee extension 3+/5, ankle DF 3+/5 LLE Deficits / Details: hip flexion 2+/5, knee extension 3/5, ankle DF 3/5     Communication   Communication: No difficulties  Cognition Arousal/Alertness: Awake/alert Behavior During Therapy: Flat affect Overall Cognitive Status: Within  Functional Limits for tasks assessed                      General Comments      Exercises        Assessment/Plan    PT Assessment Patient needs continued PT services  PT Diagnosis Difficulty walking;Generalized weakness   PT Problem List Decreased strength;Impaired sensation;Decreased balance;Decreased mobility;Decreased knowledge of use of DME;Decreased activity tolerance  PT Treatment Interventions DME instruction;Gait training;Functional mobility training;Patient/family education;Therapeutic activities;Therapeutic  exercise;Balance training;Neuromuscular re-education;Wheelchair mobility training   PT Goals (Current goals can be found in the Care Plan section) Acute Rehab PT Goals PT Goal Formulation: With patient Time For Goal Achievement: 05/28/15 Potential to Achieve Goals: Good    Frequency Min 4X/week   Barriers to discharge        Co-evaluation               End of Session Equipment Utilized During Treatment: Gait belt Activity Tolerance: Patient limited by fatigue Patient left: in bed;with call bell/phone within reach Nurse Communication: Mobility status (Stedy if OOB)         Time: 9604-5409 PT Time Calculation (min) (ACUTE ONLY): 15 min   Charges:   PT Evaluation $Initial PT Evaluation Tier I: 1 Procedure     PT G Codes:        Philana Younis,KATHrine E 05/14/2015, 12:51 PM Zenovia Jarred, PT, DPT 05/14/2015 Pager: [phone]

## 2015-05-14 NOTE — Consult Note (Signed)
Physical Medicine and Rehabilitation Consult Reason for Consult:GBS Referring Physician: [facility]   HPI: Donald Mckay. is a 24 y.o. male with unremarkable past medical history presented 05/12/2015 with numbness bilateral lower extremities and feet 2 days spread to involve his distal upper extremities. 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 Denied any bowel or bladder abnormalities. He had been experiencing headaches with nausea and vomiting. Cranial CT scan negative. MR cervical and I thoracic spine with no compressive pathology without lesion. Neurology consulted underwent lumbar puncture showing CSF protein elevated with normal WBC count. Suspect GBS placed on IVIG 5 days. Physical therapy evaluation completed 05/14/2015 with recommendations to physical medicine rehabilitation consult  Patient has some blurriness of vision but denies any swallowing problems. He complains of being numb from the waist on down but denies any bowel or bladder problems. Review of Systems  Constitutional: Negative for fever and chills.       Progressive bilateral weakness lower extremities  HENT: Negative for hearing loss.   Eyes: Negative for blurred vision and double vision.  Respiratory: Negative for cough and shortness of breath.   Cardiovascular: Negative for chest pain, palpitations and leg swelling.  Gastrointestinal: Positive for nausea, vomiting and constipation.  Genitourinary: Negative for dysuria, urgency and hematuria.  Musculoskeletal: Positive for myalgias.  Skin: Negative for rash.  Neurological: Positive for tingling and headaches. Negative for dizziness.   Past Medical History  Diagnosis Date  . Medical history non-contributory    Past Surgical History  Procedure Laterality Date  . Nasal cauterization     Family History  Problem Relation Age of Onset  . Cancer Mother   . Stroke Maternal Grandmother    Social History:  reports  that he has been smoking.  He does not have any smokeless tobacco history on file. He reports that he does not drink alcohol or use illicit drugs. Allergies:  Allergies  Allergen Reactions  . Ibuprofen     Nose bleeds   Medications Prior to Admission  Medication Sig Dispense Refill  . cephALEXin (KEFLEX) 500 MG capsule Take 1 capsule (500 mg total) by mouth 4 (four) times daily. (Patient not taking: Reported on 05/11/2015) 40 capsule 0  . hydrocortisone 2.5 % lotion Apply topically 2 (two) times daily. (Patient not taking: Reported on 05/11/2015) 59 mL 0  . permethrin (ACTICIN) 5 % cream Apply topically once. (Patient not taking: Reported on 05/11/2015) 60 g 0    Home: Home Living Family/patient expects to be discharged to:: Private residence Living Arrangements: Other relatives Additional Comments: pt not very contributory  Functional History: Prior Function Level of Independence: Independent Functional Status:  Mobility: Bed Mobility Overal bed mobility: Needs Assistance Bed Mobility: Supine to Sit, Sit to Supine Supine to sit: Supervision, HOB elevated Sit to supine: Mod assist General bed mobility comments: uncoordinated and effortful movement, some assist for each LE onto bed Transfers Overall transfer level: Needs assistance Equipment used: Rolling walker (2 wheeled) Transfers: Sit to/from Stand Sit to Stand: Mod assist, +2 physical assistance, +2 safety/equipment General transfer comment: demonstrated technique for pt, verbal cues for hand placement, assist due to weakness and to stabilize, attempted marching however pt unable to lift either LE from the floor, returned to sitting for rest break and then pt reported seeing "spots" so assisted back to supine      ADL:    Cognition: Cognition Overall Cognitive Status: Within Functional Limits for tasks assessed Orientation Level: Oriented to  person, Oriented to place, Oriented to time, Oriented to  situation Cognition Arousal/Alertness: Awake/alert Behavior During Therapy: Flat affect Overall Cognitive Status: Within Functional Limits for tasks assessed  Blood pressure 150/82, pulse 81, temperature 97.7 F (36.5 C), temperature source Oral, resp. rate 17, height 6\' 1"  (1.854 m), weight 138.8 kg (306 lb), SpO2 99 %. Physical Exam  Constitutional: He appears well-developed.  HENT:  Head: Normocephalic.  Eyes: EOM are normal.  Neck: Normal range of motion. Neck supple. No thyromegaly present.  Cardiovascular: Normal rate and regular rhythm.   Respiratory: Effort normal and breath sounds normal. No respiratory distress.  GI: Soft. Bowel sounds are normal. He exhibits no distension.  Neurological:  Mood is flat but appropriate. He is oriented 3 and follows commands  Skin: Skin is warm and dry.  Motor strength is 5/5 bilateral deltoids, biceps, triceps, grip His lower extremity examination is inconsistent. He states that he cannot move his legs but then when he is given some active assist with hip flexion he is able to hold his leg out without any further assistance. He has no active ankle dorsiflexion or plantar flexion but he has near full range of inversion eversion. He has no toe flexion and extension to command. His sensation is normal in the upper extremities but absent in the lower extremity and in fact has reduced sensation to the navel to light touch. He is able to go from supine to sit without requiring upper extremity assistance.   Results for orders placed or performed during the hospital encounter of 05/11/15 (from the past 24 hour(s))  CBC     Status: None   Collection Time: 05/14/15  4:53 AM  Result Value Ref Range   WBC 4.5 4.0 - 10.5 K/uL   RBC 4.49 4.22 - 5.81 MIL/uL   Hemoglobin 13.6 13.0 - 17.0 g/dL   HCT 16.1 09.6 - 04.5 %   MCV 91.1 78.0 - 100.0 fL   MCH 30.3 26.0 - 34.0 pg   MCHC 33.3 30.0 - 36.0 g/dL   RDW 40.9 81.1 - 91.4 %   Platelets 230 150 - 400  K/uL  Basic metabolic panel     Status: Abnormal   Collection Time: 05/14/15  4:53 AM  Result Value Ref Range   Sodium 139 135 - 145 mmol/L   Potassium 3.7 3.5 - 5.1 mmol/L   Chloride 105 101 - 111 mmol/L   CO2 29 22 - 32 mmol/L   Glucose, Bld 90 65 - 99 mg/dL   BUN 8 6 - 20 mg/dL   Creatinine, Ser 7.82 0.61 - 1.24 mg/dL   Calcium 8.6 (L) 8.9 - 10.3 mg/dL   GFR calc non Af Amer >60 >60 mL/min   GFR calc Af Amer >60 >60 mL/min   Anion gap 5 5 - 15   Mr Cervical Spine Wo Contrast  05/13/2015   CLINICAL DATA:  Numbness in the right hand and in both lower extremities. Dizziness. Question Guillain-Barre syndrome.  EXAM: MRI CERVICAL AND thoracic SPINE WITHOUT CONTRAST  TECHNIQUE: Multiplanar and multiecho pulse sequences of the cervical spine, to include the craniocervical junction and cervicothoracic junction, and thoracic spine, were obtained without intravenous contrast.  COMPARISON:  None.  FINDINGS: MRI CERVICAL SPINE FINDINGS  Cervical exam is mildly degraded by motion. There are minimal non-compressive disc bulges at C4-5 and C5-6. No canal or foraminal stenosis. No abnormal cord signal. No osseous or articular focal lesion.  MRI thoracic SPINE FINDINGS  Thoracic study is also  somewhat degraded by motion. Alignment of the spine is normal. No degenerative disc disease. No canal or foraminal stenosis. No signal abnormality of the spinal cord. The distal cord and conus are normal with conus tip at L1.  Guillain-Barre syndrome can present with the only finding of nerve root enhancement of the cauda equina. That cannot be evaluated on this study.  IMPRESSION: Motion degraded but otherwise unremarkable studies of the cervical and thoracic spine. No compressive pathology. No finding of Guillain-Barre syndrome on this nonenhanced study. Often, the only imaging sign of this disease is enhancement of the nerve roots of the cauda equina, which cannot be evaluated on this study.   Electronically Signed   By:  Paulina Fusi M.D.   On: 05/13/2015 19:57   Mr Thoracic Spine Wo Contrast  05/13/2015   CLINICAL DATA:  Numbness in the right hand and in both lower extremities. Dizziness. Question Guillain-Barre syndrome.  EXAM: MRI CERVICAL AND thoracic SPINE WITHOUT CONTRAST  TECHNIQUE: Multiplanar and multiecho pulse sequences of the cervical spine, to include the craniocervical junction and cervicothoracic junction, and thoracic spine, were obtained without intravenous contrast.  COMPARISON:  None.  FINDINGS: MRI CERVICAL SPINE FINDINGS  Cervical exam is mildly degraded by motion. There are minimal non-compressive disc bulges at C4-5 and C5-6. No canal or foraminal stenosis. No abnormal cord signal. No osseous or articular focal lesion.  MRI thoracic SPINE FINDINGS  Thoracic study is also somewhat degraded by motion. Alignment of the spine is normal. No degenerative disc disease. No canal or foraminal stenosis. No signal abnormality of the spinal cord. The distal cord and conus are normal with conus tip at L1.  Guillain-Barre syndrome can present with the only finding of nerve root enhancement of the cauda equina. That cannot be evaluated on this study.  IMPRESSION: Motion degraded but otherwise unremarkable studies of the cervical and thoracic spine. No compressive pathology. No finding of Guillain-Barre syndrome on this nonenhanced study. Often, the only imaging sign of this disease is enhancement of the nerve roots of the cauda equina, which cannot be evaluated on this study.   Electronically Signed   By: Paulina Fusi M.D.   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 1. Does the need for close, 24 hr/day medical supervision in concert with the patient's rehab needs make it unreasonable for this patient to be served in a less intensive setting? Potentially 2. Co-Morbidities requiring supervision/potential complications: Potential for  nerve pain none currently 3. Due to disease management, medication administration and patient education, does the patient require 24 hr/day rehab nursing? Potentially 4. Does the patient require coordinated care of a physician, rehab nurse, PT (1-2 hrs/day, 55 days/week) and OT (1-2 hrs/day, 5 days/week) to address physical and functional deficits in the context of the above medical diagnosis(es)? Potentially Addressing deficits in the following areas: balance, endurance, locomotion, strength, transferring, bathing, dressing, grooming and toileting 5. Can the patient actively participate in an intensive therapy program of at least 3 hrs of therapy per day at least 5 days per week? Potentially 6. The potential for patient to make measurable gains while on inpatient rehab is good 7. Anticipated functional outcomes upon discharge from inpatient rehab are modified independent  with PT, modified independent with OT, n/a with SLP. 8. Estimated rehab length of stay to reach the above functional goals is: 7d 9. Does the patient have adequate social supports and living environment to accommodate these discharge  functional goals? Potentially 10. Anticipated D/C setting: Home 11. Anticipated post D/C treatments: HH therapy 12. Overall Rehab/Functional Prognosis: good  RECOMMENDATIONS: This patient's condition is appropriate for continued rehabilitative care in the following setting: CIR vs home with [facility] depending on therapy progress Patient has agreed to participate in recommended program. Potentially Note that insurance prior authorization may be required for reimbursement for recommended care.  Comment: Suspect a strong psychological overlay given his inconsistent examination as well as lack of bowel or bladder dysfunction despite subjective complaints of numbness below the waist. Would recommend MRI of the lumbar spine as pointed out by radiology. EMG would be helpful  although not available in the hospital  setting. Psychology evaluation may be helpful to see if there are any underlying stressors.    05/14/2015

## 2015-05-15 DIAGNOSIS — F32A Depression, unspecified: Secondary | ICD-10-CM | POA: Clinically undetermined

## 2015-05-15 DIAGNOSIS — G610 Guillain-Barre syndrome: Principal | ICD-10-CM

## 2015-05-15 DIAGNOSIS — F329 Major depressive disorder, single episode, unspecified: Secondary | ICD-10-CM | POA: Clinically undetermined

## 2015-05-15 LAB — CBC
HCT: 40.9 % (ref 39.0–52.0)
HEMOGLOBIN: 13.8 g/dL (ref 13.0–17.0)
MCH: 30.7 pg (ref 26.0–34.0)
MCHC: 33.7 g/dL (ref 30.0–36.0)
MCV: 90.9 fL (ref 78.0–100.0)
Platelets: 233 10*3/uL (ref 150–400)
RBC: 4.5 MIL/uL (ref 4.22–5.81)
RDW: 13.1 % (ref 11.5–15.5)
WBC: 5.2 10*3/uL (ref 4.0–10.5)

## 2015-05-15 LAB — BASIC METABOLIC PANEL
ANION GAP: 5 (ref 5–15)
BUN: 7 mg/dL (ref 6–20)
CALCIUM: 8.6 mg/dL — AB (ref 8.9–10.3)
CHLORIDE: 103 mmol/L (ref 101–111)
CO2: 26 mmol/L (ref 22–32)
CREATININE: 0.96 mg/dL (ref 0.61–1.24)
GFR calc non Af Amer: >60 mL/min (ref >60)
Glucose, Bld: 97 mg/dL (ref 65–99)
Potassium: 4 mmol/L (ref 3.5–5.1)
SODIUM: 134 mmol/L — AB (ref 135–145)

## 2015-05-15 LAB — CSF CULTURE: CULTURE: NO GROWTH

## 2015-05-15 LAB — CSF CULTURE W GRAM STAIN

## 2015-05-15 MED ORDER — KETOROLAC TROMETHAMINE 15 MG/ML IJ SOLN
15.0000 mg | Freq: Four times a day (QID) | INTRAMUSCULAR | Status: DC
  Administered 2015-05-15 – 2015-05-18 (×13): 15 mg via INTRAVENOUS
  Filled 2015-05-15 (×14): qty 1

## 2015-05-15 MED ORDER — DOCUSATE SODIUM 100 MG PO CAPS
100.0000 mg | ORAL_CAPSULE | Freq: Two times a day (BID) | ORAL | Status: DC
  Administered 2015-05-15 – 2015-05-18 (×7): 100 mg via ORAL
  Filled 2015-05-15 (×7): qty 1

## 2015-05-15 MED ORDER — FLUOXETINE HCL 20 MG PO CAPS: 20.0000 mg | ORAL_CAPSULE | Freq: Every day | ORAL | Status: DC

## 2015-05-15 MED ORDER — GABAPENTIN 100 MG PO CAPS
100.0000 mg | ORAL_CAPSULE | Freq: Two times a day (BID) | ORAL | Status: DC
  Administered 2015-05-15 – 2015-05-18 (×7): 100 mg via ORAL
  Filled 2015-05-15 (×7): qty 1

## 2015-05-15 MED ORDER — BUPROPION HCL ER (XL) 150 MG PO TB24
150.0000 mg | ORAL_TABLET | Freq: Every day | ORAL | Status: DC
  Administered 2015-05-15 – 2015-05-18 (×4): 150 mg via ORAL
  Filled 2015-05-15 (×4): qty 1

## 2015-05-15 MED ORDER — PANTOPRAZOLE SODIUM 40 MG PO TBEC
40.0000 mg | DELAYED_RELEASE_TABLET | Freq: Every day | ORAL | Status: DC
  Administered 2015-05-15 – 2015-05-18 (×4): 40 mg via ORAL
  Filled 2015-05-15 (×4): qty 1

## 2015-05-15 MED ORDER — HYDROMORPHONE HCL 1 MG/ML IJ SOLN
1.0000 mg | INTRAMUSCULAR | Status: DC | PRN
  Administered 2015-05-15 – 2015-05-18 (×15): 1 mg via INTRAVENOUS
  Filled 2015-05-15 (×16): qty 1

## 2015-05-15 MED ORDER — HYDROMORPHONE HCL 1 MG/ML IJ SOLN: 1.0000 mg | INTRAMUSCULAR | Status: DC | PRN

## 2015-05-15 MED ORDER — POLYETHYLENE GLYCOL 3350 17 G PO PACK
17.0000 g | PACK | Freq: Every day | ORAL | Status: DC
  Administered 2015-05-15 – 2015-05-16 (×2): 17 g via ORAL
  Filled 2015-05-15 (×2): qty 1

## 2015-05-15 NOTE — Progress Notes (Signed)
[facility]                                                                              Patient Demographics  Donald Mckay, is a 24 y.o. male, DOB - 1991-09-07, ZOX:[id number]  Admit date - 05/11/2015   Admitting Physician Eduard Clos, MD  Outpatient Primary MD for the patient is No PCP Per Patient  LOS - 4   Chief Complaint  Patient presents with  . Emesis  . Confusion        Brief HPI   Donald Mckay. is a 24 y.o. male with no significant past medical history presents to the ER because of increasing numbness of the extremities progressing upwards. Patient's symptoms started on 8/21 around 11 AM. Patient's initial symptoms were numbness around his foot which has progressed to his upper extremities at this time. Patient states she also has sensation loss. Denies any incontinence of urine or bowels.  patient did report nausea and vomiting for the last 2 days prior to admission. On exam patient has poor deep tendon reflexes. Denied any recent fever chills or diarrhea episodes. Denied any recent sick contacts or vaccinations. On-call neurologist Dr. Hosie Poisson was consulted. Lumbar puncture was attempted in the ER but was unsuccessful and fluoroscopic guided lumbar puncture has been ordered. CT head was unremarkable. Patient denied any difficulty speaking or swallowing or breathing.    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 Change to Dilaudid for only severe pain - Placed on bowel regimen - Explained to the patient that he needs to work with physical therapy, OOB to chair, Increase activity    Tobacco use disorder - Consult strongly for smoking cessation, chest x-ray clear  Anxiety and depression Patient reports loss of job 2 weeks ago, has significant psychological overlay into his neurological deficits - Requested psych consult today  Code Status: Full code   Family Communication: Discussed in detail with the patient, all imaging results, lab results explained to the patient    Disposition Plan:   Time Spent in minutes  25 minutes  Procedures  LP  Consults   neuro  DVT Prophylaxis  SCD's  Medications  Scheduled Meds: . docusate sodium  100 mg Oral BID  . gabapentin  100 mg Oral BID  . Immune Globulin 10%  400 mg/kg Intravenous Q24 Hr x 5  . ketorolac  15 mg Intravenous 4 times per day  . pantoprazole  40 mg Oral Q0600  . polyethylene glycol  17 g Oral Daily   Continuous Infusions:   PRN Meds:.acetaminophen,  HYDROmorphone (DILAUDID) injection, oxyCODONE-acetaminophen   Antibiotics   Anti-infectives    None        Subjective:   Donald Mckay was seen and examined today.  continues to complain of headache, pain, constipation, no improvement in his numbness and weakness of the lower extremities.  Denies any shortness of breath. Patient denies dizziness, chest pain, shortness of breath, abdominal pain, N/V/D/C.No fevers  Objective:   Blood pressure 158/92, pulse 87, temperature 98.4 F (36.9 C), temperature source Oral, resp. rate 18, height 6\' 1"  (1.854 m), weight 138.8 kg (306 lb), SpO2 99 %.  Wt Readings from Last 3 Encounters:  05/11/15 138.8 kg (306 lb)  05/13/15 138.801 kg (306 lb)  05/13/15 138.801 kg (306 lb)     Intake/Output Summary (Last 24 hours) at 05/15/15 1313 Last data filed at 05/15/15 1026   Gross per 24 hour  Intake   1200 ml  Output   2800 ml  Net  -1600 ml    Exam  General: Alert and oriented x 3, NAD  HEENT:  PERRLA, EOMI  Neck: Supple, no JVD, no masses  CVS: S1 S2 clear, RRR  Respiratory: CTAB  Abdomen: Soft, NT, ND, NBS  Ext: no cyanosis clubbing or edema  Neuro: poor sensation of lower extremities   Skin: No rashes  Psych: depressed   Data Review   Micro Results Recent Results (from the past 240 hour(s))  Fungus Culture with Smear     Status: None (Preliminary result)   Collection Time: 05/12/15 11:35 AM  Result Value Ref Range Status   Specimen Description CSF  Final   Special Requests NONE  Final   Fungal Smear   Final    NO YEAST OR FUNGAL ELEMENTS SEEN Performed at [facility]    Culture   Final    CULTURE IN PROGRESS FOR FOUR WEEKS Performed at [facility]    Report Status PENDING  Incomplete  Gram stain     Status: None   Collection Time: 05/12/15 11:35 AM  Result Value Ref Range Status   Specimen Description CSF  Final   Special Requests NONE  Final   Gram Stain   Final    CYTOSPIN WBC PRESENT,BOTH PMN AND MONONUCLEAR NO ORGANISMS SEEN Gram Stain Report Called to,Read Back By and Verified With: EThad Ranger RN AT 1235 ON 08.23.16 BYSHUEA    Report Status 05/12/2015 FINAL  Final  CSF culture     Status: None   Collection Time: 05/12/15 11:35 AM  Result Value Ref Range Status   Specimen Description CSF  Final   Special Requests NONE  Final   Gram Stain   Final    WBC PRESENT,BOTH PMN AND MONONUCLEAR NO ORGANISMS SEEN CYTOSPIN SMEAR Gram Stain Report Called to,Read Back By and Verified With: Wynona Meals at 1235 on [id number] by [facility]    Culture   Final    NO GROWTH 3 DAYS Performed at [facility]    Report Status 05/15/2015 FINAL  Final    Radiology Reports Dg Chest 2 View  05/12/2015   CLINICAL DATA:  Weakness and increased numbness of the extremities.  EXAM: CHEST  2 VIEW  COMPARISON:   01/28/2014  FINDINGS: Cardiomediastinal silhouette is normal. Mediastinal contours appear intact.  There is no evidence of focal airspace consolidation, pleural effusion or pneumothorax.  Osseous structures are without acute abnormality. Soft tissues are grossly normal.  IMPRESSION: No radiographic evidence of acute cardiopulmonary abnormality.   Electronically Signed  By: Ted Mcalpine M.D.   On: 05/12/2015 10:21   Ct Head Wo Contrast  05/11/2015   CLINICAL DATA:  Confusion, numbness, vomiting  EXAM: CT HEAD WITHOUT CONTRAST  TECHNIQUE: Contiguous axial images were obtained from the base of the skull through the vertex without intravenous contrast.  COMPARISON:  None.  FINDINGS: No skull fracture is noted. Paranasal sinuses and mastoid air cells are unremarkable. No intracranial hemorrhage, mass effect or midline shift. No acute infarction. No hydrocephalus. No mass lesion is noted on this unenhanced scan.  IMPRESSION: No acute intracranial abnormality.   Electronically Signed   By: Natasha Mead M.D.   On: 05/11/2015 17:49   Mr Cervical Spine Wo Contrast  05/13/2015   CLINICAL DATA:  Numbness in the right hand and in both lower extremities. Dizziness. Question Guillain-Barre syndrome.  EXAM: MRI CERVICAL AND thoracic SPINE WITHOUT CONTRAST  TECHNIQUE: Multiplanar and multiecho pulse sequences of the cervical spine, to include the craniocervical junction and cervicothoracic junction, and thoracic spine, were obtained without intravenous contrast.  COMPARISON:  None.  FINDINGS: MRI CERVICAL SPINE FINDINGS  Cervical exam is mildly degraded by motion. There are minimal non-compressive disc bulges at C4-5 and C5-6. No canal or foraminal stenosis. No abnormal cord signal. No osseous or articular focal lesion.  MRI thoracic SPINE FINDINGS  Thoracic study is also somewhat degraded by motion. Alignment of the spine is normal. No degenerative disc disease. No canal or foraminal stenosis. No signal abnormality of  the spinal cord. The distal cord and conus are normal with conus tip at L1.  Guillain-Barre syndrome can present with the only finding of nerve root enhancement of the cauda equina. That cannot be evaluated on this study.  IMPRESSION: Motion degraded but otherwise unremarkable studies of the cervical and thoracic spine. No compressive pathology. No finding of Guillain-Barre syndrome on this nonenhanced study. Often, the only imaging sign of this disease is enhancement of the nerve roots of the cauda equina, which cannot be evaluated on this study.   Electronically Signed   By: Paulina Fusi M.D.   On: 05/13/2015 19:57   Mr Thoracic Spine Wo Contrast  05/13/2015   CLINICAL DATA:  Numbness in the right hand and in both lower extremities. Dizziness. Question Guillain-Barre syndrome.  EXAM: MRI CERVICAL AND thoracic SPINE WITHOUT CONTRAST  TECHNIQUE: Multiplanar and multiecho pulse sequences of the cervical spine, to include the craniocervical junction and cervicothoracic junction, and thoracic spine, were obtained without intravenous contrast.  COMPARISON:  None.  FINDINGS: MRI CERVICAL SPINE FINDINGS  Cervical exam is mildly degraded by motion. There are minimal non-compressive disc bulges at C4-5 and C5-6. No canal or foraminal stenosis. No abnormal cord signal. No osseous or articular focal lesion.  MRI thoracic SPINE FINDINGS  Thoracic study is also somewhat degraded by motion. Alignment of the spine is normal. No degenerative disc disease. No canal or foraminal stenosis. No signal abnormality of the spinal cord. The distal cord and conus are normal with conus tip at L1.  Guillain-Barre syndrome can present with the only finding of nerve root enhancement of the cauda equina. That cannot be evaluated on this study.  IMPRESSION: Motion degraded but otherwise unremarkable studies of the cervical and thoracic spine. No compressive pathology. No finding of Guillain-Barre syndrome on this nonenhanced study. Often, the  only imaging sign of this disease is enhancement of the nerve roots of the cauda equina, which cannot be evaluated on this study.   Electronically Signed   By: Loraine Leriche  Shogry M.D.   On: 05/13/2015 19:57   Dg Fluoro Guide Lumbar Puncture  05/12/2015   CLINICAL DATA:  Bilateral leg and arm weakness.  Initial encounter  EXAM: DIAGNOSTIC LUMBAR PUNCTURE UNDER FLUOROSCOPIC GUIDANCE  FLUOROSCOPY TIME:  Radiation Exposure Index (as provided by the fluoroscopic device):  If the device does not provide the exposure index:  Fluoroscopy Time (in minutes and seconds):  1 minutes 7 seconds  Number of Acquired Images:  2  PROCEDURE: Informed consent was obtained from the patient prior to the procedure, including potential complications of headache, allergy, and pain. With the patient prone, the lower back was prepped with Betadine. 1% Lidocaine was used for local anesthesia. Lumbar puncture was performed at the L5-S1 level using a 22 gauge needle with return of clear CSF with an opening pressure of 10 cm water. 7.5 ml of CSF were obtained for laboratory studies. The patient tolerated the procedure well and there were no apparent complications.  IMPRESSION: Successful lumbar puncture.   Electronically Signed   By: Genevive Bi M.D.   On: 05/12/2015 12:02    CBC  Recent Labs Lab 05/11/15 1452 05/12/15 0509 05/13/15 0444 05/14/15 0453 05/15/15 0450  WBC 7.3 6.2 5.1 4.5 5.2  HGB 15.2 13.8 13.7 13.6 13.8  HCT 45.3 41.5 41.8 40.9 40.9  PLT 250 232 205 230 233  MCV 89.2 91.4 90.7 91.1 90.9  MCH 29.9 30.4 29.7 30.3 30.7  MCHC 33.6 33.3 32.8 33.3 33.7  RDW 12.8 13.2 13.2 13.3 13.1    Chemistries   Recent Labs Lab 05/11/15 1452 05/12/15 0509 05/13/15 0444 05/14/15 0453 05/15/15 0450  NA 141 140 138 139 134*  K 3.7 3.7 3.8 3.7 4.0  CL 109 105 107 105 103  CO2 28 28 28 29 26   GLUCOSE 93 86 92 90 97  BUN <5* 6 8 8 7   CREATININE 0.80 0.98 0.93 0.92 0.96  CALCIUM 9.1 8.8* 8.6* 8.6* 8.6*  AST 31 21  --    --   --   ALT 37 33  --   --   --   ALKPHOS 55 50  --   --   --   BILITOT 0.6 0.8  --   --   --    ------------------------------------------------------------------------------------------------------------------ estimated creatinine clearance is 175.2 mL/min (by C-G formula based on Cr of 0.96). ------------------------------------------------------------------------------------------------------------------ No results for input(s): HGBA1C in the last 72 hours. ------------------------------------------------------------------------------------------------------------------ No results for input(s): CHOL, HDL, LDLCALC, TRIG, CHOLHDL, LDLDIRECT in the last 72 hours. ------------------------------------------------------------------------------------------------------------------ No results for input(s): TSH, T4TOTAL, T3FREE, THYROIDAB in the last 72 hours.  Invalid input(s): FREET3 ------------------------------------------------------------------------------------------------------------------ No results for input(s): VITAMINB12, FOLATE, FERRITIN, TIBC, IRON, RETICCTPCT in the last 72 hours.  Coagulation profile No results for input(s): INR, PROTIME in the last 168 hours.  No results for input(s): DDIMER in the last 72 hours.  Cardiac Enzymes No results for input(s): CKMB, TROPONINI, MYOGLOBIN in the last 168 hours.  Invalid input(s): CK ------------------------------------------------------------------------------------------------------------------ Invalid input(s): POCBNP  No results for input(s): GLUCAP in the last 72 hours.   Fountain Derusha M.D. [facility] 05/15/2015, 1:13 PM  Pager: [phone] Between 7am to 7pm - call Pager - [phone]  After 7pm go to [email or web address] - password [email or web address]  Call night coverage person covering after 7pm

## 2015-05-15 NOTE — Progress Notes (Signed)
Occupational Therapy Treatment Patient Details Name: Donald Mckay [facility]. 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  Pt to receive 5 doses of IVIG. MRI of the cervical and thoracic spine showed no cord lesion    OT comments  Good participation today. Able to perform SPT to recliner, work with reacher for ADLs and perform UE exercises  Follow Up Recommendations  CIR    Equipment Recommendations  3 in 1 bedside comode    Recommendations for Other Services      Precautions / Restrictions Precautions Precautions: Fall Precaution Comments: absent sensation LB, absent RUE, impaired LUE Restrictions Weight Bearing Restrictions: No       Mobility Bed Mobility         Supine to sit: Min assist     General bed mobility comments: light min A to slide RLE over on bed; HOB raised  Transfers   Equipment used: Rolling walker (2 wheeled) Transfers: Sit to/from [employer] Sit to Stand: Mod assist;+2 physical assistance;+2 safety/equipment Stand pivot transfers: Mod assist;+2 physical assistance;+2 safety/equipment       General transfer comment: heavy reliance on UEs, able to advance feet small steps for SPT    Balance                                   ADL                                         General ADL Comments: simulated LB bathing and dressing from EOB with reacher.  Pt had difficulty hooking pillowcase over feet.        Vision                     Perception     Praxis      Cognition   Behavior During Therapy: Flat affect Overall Cognitive Status: Within Functional Limits for tasks assessed (needed repeated cue for tasks)                       Extremity/Trunk Assessment               Exercises Other  Exercises Other Exercises: performed 5 reps each, AROM shoulders to 90, elbows and wrists Other Exercises: Squeezed ball 5 x each hand Other Exercises: rolled level one putty between hands and performed pinch with thumb and each finger, x 1 set   Shoulder Instructions       General Comments      Pertinent Vitals/ Pain       Pain Score: 10-Worst pain ever Pain Location: headache Pain Intervention(s): Limited activity within patient's tolerance;Monitored during session;Repositioned;Patient requesting pain meds-RN notified  Home Living                                          Prior Functioning/Environment              Frequency Min 2X/week     Progress Toward Goals  OT Goals(current goals can now be found in the care plan  section)  Progress towards OT goals: Progressing toward goals (one goal upgraded)  ADL Goals Pt Will Transfer to Toilet: with min assist;with +2 assist;bedside commode;stand pivot transfer  Plan      Co-evaluation                 End of Session     Activity Tolerance Patient tolerated treatment well   Patient Left in chair;with call bell/phone within reach   Nurse Communication          Time: 9381-0175 OT Time Calculation (min): 26 min  Charges: OT General Charges $OT Visit: 1 Procedure OT Treatments $Self Care/Home Management : 8-22 mins $Therapeutic Activity: 8-22 mins  Tanyia Grabbe 05/15/2015, 8:49 AM   Marica Otter, OTR/L [phone] 05/15/2015

## 2015-05-15 NOTE — Clinical Social Work Psych Assess (Addendum)
Clinical Social Work Nature conservation officer  Clinical Social Worker:  Boone Master, [place] Date/Time:  05/15/2015, 3:18 PM Referred By:  Physician Date Referred:  05/15/15 Reason for Referral:  Behavioral Health Issues   Presenting Symptoms/Problems  Presenting Symptoms/Problems(in person's/family's own words):  Psych consulted due to depression.   Abuse/Neglect/Trauma History  Abuse/Neglect/Trauma History:  Denies History Abuse/Neglect/Trauma History Comments (indicate dates):  N/A   Psychiatric History  Psychiatric History:  Inpatient/Hospitalization Psychiatric Medication:  None reported   Current Mental Health Hospitalizations/Previous Mental Health History:  Patient reports a history of depression.   Current Provider:  None currently Place and Date:  N/A  Current Medications:   Scheduled Meds: . buPROPion  150 mg Oral Daily  . docusate sodium  100 mg Oral BID  . gabapentin  100 mg Oral BID  . Immune Globulin 10%  400 mg/kg Intravenous Q24 Hr x 5  . ketorolac  15 mg Intravenous 4 times per day  . pantoprazole  40 mg Oral Q0600  . polyethylene glycol  17 g Oral Daily   Continuous Infusions:  PRN Meds:.acetaminophen, HYDROmorphone (DILAUDID) injection, oxyCODONE-acetaminophen     Previous Inpatient Admission/Date/Reason:  Patient was hospitalized in [place] after cutting his vein in a suicide attempt.   Emotional Health/Current Symptoms  Suicide/Self Harm: Suicide Attempt in the Past (date/description) Suicide Attempt in Past (date/description):  Patient had one previous attempt but denies any current attempts.  Other Harmful Behavior (ex. homicidal ideation) (describe):  N/A   Psychotic/Dissociative Symptoms  Psychotic/Dissociative Symptoms: None Reported Other Psychotic/Dissociative Symptoms:  N/A   Attention/Behavioral Symptoms  Attention/Behavioral Symptoms: Within Normal Limits Other Attention/Behavioral Symptoms:   Patient engaged during assessment.   Cognitive Impairment  Cognitive Impairment:  Within Normal Limits Other Cognitive Impairment:  Patient alert and oriented.   Mood and Adjustment  Mood and Adjustment:  Mood Congruent   Stress, Anxiety, Trauma, Any Recent Loss/Stressor  Stress, Anxiety, Trauma, Any Recent Loss/Stressor: None Reported Anxiety (frequency):  N/A  Phobia (specify):  N/A  Compulsive Behavior (specify):  N/A  Obsessive Behavior (specify):  N/A  Other Stress, Anxiety, Trauma, Any Recent Loss/Stressor:  N/A   Substance Abuse/Use  Substance Abuse/Use: None SBIRT Completed (please refer for detailed history): No Self-reported Substance Use (last use and frequency):  Patient denies any substance use but had positive screen for THC.  Urinary Drug Screen Completed: Yes Alcohol Level:  <5   Environment/Housing/Living Arrangement  Environmental/Housing/Living Arrangement: Stable Housing Who is in the Home:  Mom and grandmother  Emergency Contact:  Mom   Financial  Financial: IPRS   Patient's Strengths and Goals  Patient's Strengths and Goals (patient's own words):  Patient has supportive family.   Clinical Social Worker's Interpretive Summary  Clinical Social Workers Interpretive Summary:    CSW and psych MD rounded together to assess patient.   Patient has lived in [place] for most of his life. Patient lived with mom until about 81 but moved out to live with grandmother and friends. Patient met his father when he was 34 years old and traveled with him. In 2010, patient was living with dad in [place] and was admitted to psych hospital after attempting suicide. Patient reports some mild chronic depression but never followed up with psychiatrist, counselor or medication management after hospitalization. Patient denies any SI or HI and contracts for safety. Patient currently lives with mom and grandmother and works as a Glass blower/designer. Patient has not  worked in the past 3 weeks but  developed numbness in his legs and as hospitalized.  Patient agreeable to try some medication to see if symptoms of depression can be better managed. Psych MD to make medication recommendations. Outpatient follow up information placed on AVS for patient to see psychiatrist in the community at [place].  CSW will continue to follow.   Disposition  Disposition: Recommend Psych CSW Continuing To Support While In [facility] [place]
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
## 2015-05-15 NOTE — Progress Notes (Addendum)
Subjective: Continues to have decreased sensation in legs and hands--also states his legs remain heavy feeling. NO SOB  Exam: Filed Vitals:   05/15/15 0457  BP: 158/92  Pulse: 87  Temp: 98.4 F (36.9 C)  Resp: 18    HEENT-  Normocephalic, no lesions, without obvious abnormality.  Normal external eye and conjunctiva.  Normal TM's bilaterally.  Normal auditory canals and external ears. Normal external nose, mucus membranes and septum.  Normal pharynx. Cardiovascular- S1, S2 normal, pulses palpable throughout   Lungs- chest clear, no wheezing, rales, normal symmetric air entry, Heart exam - S1, S2 normal, no murmur, no gallop, rate regular Abdomen- normal findings: bowel sounds normal Extremities- no edema     Gen: In bed, NAD MS: AOX4, follows all commands, speech clear CN: PERRLA, EOMI, TML, Face symm, vision intact Motor: UE 5/5 bilaterally, bilateral LE able lift legs off bed and shows good strength at hip.  Bilateral ankle strength shows poor effort and give way weakness.  Sensory: intact in UE and states to be decreased in legs.  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   Recommendations: 1. No change in current immunotherapy with IVIG. Patient is scheduled to receive his third of 5 planned treatments today. 2. Physical therapy and occupational therapy intervention. 3. Allergies medication as needed. 4. No need for L-spine imaging as hand involvement would rule out isolated L-spine findings.     05/15/2015, 9:00 AM

## 2015-05-15 NOTE — Progress Notes (Signed)
Inpatient Rehabilitation  I have reviewed Dr. Rodman Pickle' rehab recommendations and note that pt. still undergoing medical treatment (IVIG day 4 /5 today), and psych work up.  I have spoken with Unk Lightning SW and  Sandford Craze, CM.  I will plan to follow up on Monday .  Please call if questions.  Weldon Picking PT Inpatient Rehab Admissions Coordinator Cell [phone] Office [phone]

## 2015-05-15 NOTE — Progress Notes (Signed)
NIF -40 VC 2.5L. Patient gave good effort using proper technique. RT will continue to follow.

## 2015-05-15 NOTE — Progress Notes (Signed)
Physical Therapy Treatment Patient Details Name: Donald Jost Yett Jr. MRN: [id number] DOB: Aug 19, 1991 Today's Date: 05/15/2015    History of Present Illness Pt is a 24 year old male admitted for progressive mumbness from lower extremities upwards: Concerning for Guillian Barre syndrome.  Pt to receive 5 doses of IVIG. MRI of the cervical and thoracic spine showed no cord lesion     PT Comments    Pt able to ambulate short distance today.  Gait pattern is effortful and very stiff (decreased bil flexion) despite cues.  Pt also required frequent verbal cues for sequence.  Distance limited by fatigue.  Follow Up Recommendations  CIR     Equipment Recommendations  Wheelchair (measurements PT);Rolling walker with 5" wheels    Recommendations for Other Services       Precautions / Restrictions Precautions Precautions: Fall Precaution Comments: absent sensation LB, absent RUE, impaired LUE Restrictions Weight Bearing Restrictions: No    Mobility  Bed Mobility Overal bed mobility: Needs Assistance Bed Mobility: Supine to Sit;Sit to Supine     Supine to sit: Supervision;HOB elevated Sit to supine: Supervision;HOB elevated   General bed mobility comments: uncoordinated and effortful movement  Transfers Overall transfer level: Needs assistance Equipment used: Rolling walker (2 wheeled) Transfers: Sit to/from Stand Sit to Stand: Mod assist;+2 physical assistance;+2 safety/equipment         General transfer comment: verbal cues for technique, assist due to weakness and to stabilize, increased reliance on UEs through RW  Ambulation/Gait Ambulation/Gait assistance: Min assist;+2 safety/equipment Ambulation Distance (Feet): 20 Feet Assistive device: Rolling walker (2 wheeled) Gait Pattern/deviations: Step-to pattern;Wide base of support;Trunk flexed     General Gait Details: pt reports L LE feels weaker today so performed step to pattern, pt requiring max verbal cues  for sequencing due to "confusion" per pt, despite using RW for UE weight bearing pt had a few instances of LOB while in stance on L LE while swinging R LE however would self correct balance, recliner following for safety   Stairs            Wheelchair Mobility    Modified Rankin (Stroke Patients Only)       Balance                                    Cognition Arousal/Alertness: Awake/alert Behavior During Therapy: Flat affect Overall Cognitive Status: Within Functional Limits for tasks assessed (needed repeated cue for tasks) Area of Impairment: Problem solving             Problem Solving: Difficulty sequencing;Slow processing;Requires verbal cues      Exercises      General Comments        Pertinent Vitals/Pain Pain Assessment: 0-10 Pain Score: 10-Worst pain ever Pain Location: headache Pain Descriptors / Indicators: Headache Pain Intervention(s): Limited activity within patient's tolerance;Repositioned;Monitored during session    Home Living                      Prior Function            PT Goals (current goals can now be found in the care plan section) Progress towards PT goals: Progressing toward goals    Frequency  Min 4X/week    PT Plan Current plan remains appropriate    Co-evaluation             End of Session Equipment  Utilized During Treatment: Gait belt Activity Tolerance: Patient limited by fatigue Patient left: in bed;with call bell/phone within reach     Time: 1610-9604 PT Time Calculation (min) (ACUTE ONLY): 17 min  Charges:  $Gait Training: 8-22 mins                    G Codes:      Parissa Chiao,KATHrine E 05/30/15, 3:50 PM Zenovia Jarred, PT, DPT 2015/05/30 Pager: [phone]

## 2015-05-15 NOTE — Care Management Note (Signed)
Case Management Note  Patient Details  Name: Leeman Johnsey [facility] MRN: [id number] Date of Birth: 01/07/1991  Subjective/Objective:       24 yo admitted with numbness             Action/Plan: From home with family  Expected Discharge Date:                  Expected Discharge Plan:  IP Rehab Facility  In-House Referral:  Clinical Social Work  Discharge planning Services  CM Consult  Post Acute Care Choice:    Choice offered to:     DME Arranged:    DME Agency:     HH Arranged:    HH Agency:     Status of Service:  In process, will continue to follow  Medicare Important Message Given:    Date Medicare IM Given:    Medicare IM give by:    Date Additional Medicare IM Given:    Additional Medicare Important Message give by:     If discussed at Long Length of Stay Meetings, dates discussed:    Additional Comments: Potential for CIR after IVIG completed. CM will continue to follow. Bartholome Bill, RN 05/15/2015, 3:59 PM

## 2015-05-15 NOTE — Progress Notes (Signed)
NIF - 48; VC 2.73; Patient  Demonstrated understanding and had good effort with VC/NIF; RT will continue to monitor as needed.

## 2015-05-16 DIAGNOSIS — Z0189 Encounter for other specified special examinations: Secondary | ICD-10-CM | POA: Insufficient documentation

## 2015-05-16 LAB — BASIC METABOLIC PANEL
Anion gap: 3 — ABNORMAL LOW (ref 5–15)
BUN: 11 mg/dL (ref 6–20)
CHLORIDE: 103 mmol/L (ref 101–111)
CO2: 30 mmol/L (ref 22–32)
Calcium: 8.8 mg/dL — ABNORMAL LOW (ref 8.9–10.3)
Creatinine, Ser: 0.94 mg/dL (ref 0.61–1.24)
GFR calc Af Amer: >60 mL/min (ref >60)
GFR calc non Af Amer: >60 mL/min (ref >60)
Glucose, Bld: 86 mg/dL (ref 65–99)
POTASSIUM: 4.4 mmol/L (ref 3.5–5.1)
SODIUM: 136 mmol/L (ref 135–145)

## 2015-05-16 MED ORDER — POLYETHYLENE GLYCOL 3350 17 G PO PACK
17.0000 g | PACK | Freq: Two times a day (BID) | ORAL | Status: DC
  Administered 2015-05-16 – 2015-05-18 (×4): 17 g via ORAL
  Filled 2015-05-16 (×5): qty 1

## 2015-05-16 MED ORDER — DIPHENHYDRAMINE HCL 50 MG/ML IJ SOLN
25.0000 mg | INTRAMUSCULAR | Status: AC
  Administered 2015-05-16: 25 mg via INTRAVENOUS
  Filled 2015-05-16: qty 1

## 2015-05-16 MED ORDER — BISACODYL 5 MG PO TBEC
5.0000 mg | DELAYED_RELEASE_TABLET | Freq: Once | ORAL | Status: AC
  Administered 2015-05-16: 5 mg via ORAL
  Filled 2015-05-16: qty 1

## 2015-05-16 NOTE — Progress Notes (Signed)
NIF (best of 3 attempts)=  -54  FVC (best of 3 attempts)= 2.9L

## 2015-05-16 NOTE — Progress Notes (Signed)
Patient was complaining of head pain and described it as a headache feeling. Neuro check was ok. RN gave patient dilaudid  around 2120, pt still complained of pain so 2 tablets of percocet were given, patient continued to say pain hadn't changed so RN gave  of tylenol along with aromatherapy to try to relieve patients pain. Neuro check at midnight was ok but patient is very week in grips and flexion. Rockne Coons, RN

## 2015-05-16 NOTE — Progress Notes (Signed)
Subjective: No significant change thus far. Some headache with IVIG.   Exam: Filed Vitals:   05/16/15 0529  BP: 145/83  Pulse: 62  Temp: 97.7 F (36.5 C)  Resp: 16   Gen: In bed, NAD Resp: non-labored breathing, no acute distress Abd: soft, nt  Neuro: MS: awake, alert, interactive and appropriate.  ZO:XWRU Motor: 5/5 proximally, some give way at the ankles.  Sensory: decreased distally in legs.  EAV:[id number].     Impression: 24 yo M with paresthesias and giveway weakness with elevated CSF protein. His intact DTRs would be unusual for GBS, but he has received 4/5 treatments for this. I would favor continuing this at this time. He is being evaluated for rehab. For headache, will try premed with benadryl for IVIG.   Recommendations: 1) continue IVIG 2) Premed with benadryl 3) Will check back in on Monday unless other issues arise in the interim.   Ritta Slot, MD [facility] [phone]  If 7pm- 7am, please page neurology on call as listed in AMION.

## 2015-05-16 NOTE — Progress Notes (Signed)
Physical Therapy Treatment Patient Details Name: Trey Gulbranson Jessie Jr. MRN: [id number] DOB: 1991-01-09 Today's Date: 05/16/2015    History of Present Illness Pt is a 24 year old male admitted for progressive mumbness from lower extremities upwards: Concerning for Guillian Barre syndrome.  Pt to receive 5 doses of IVIG. MRI of the cervical and thoracic spine showed no cord lesion     PT Comments    Pt continues to be a good candidate for CIR.  Pt did not ambulate as far today, but quality of gait was improved.  Follow Up Recommendations  CIR     Equipment Recommendations  Wheelchair (measurements PT);Rolling walker with 5" wheels    Recommendations for Other Services       Precautions / Restrictions Precautions Precautions: Fall Precaution Comments: absent sensation LB, absent RUE, impaired LUE Restrictions Weight Bearing Restrictions: No    Mobility  Bed Mobility Overal bed mobility: Needs Assistance Bed Mobility: Supine to Sit     Supine to sit: Supervision;HOB elevated     General bed mobility comments: Slow and very purposeful movement.  Transfers Overall transfer level: Needs assistance Equipment used: Rolling walker (2 wheeled) Transfers: Sit to/from Stand Sit to Stand: Mod assist;+2 physical assistance         General transfer comment: cues for hand placement and for powering up.  Ambulation/Gait Ambulation/Gait assistance: Min assist;+2 safety/equipment Ambulation Distance (Feet): 15 Feet Assistive device: Rolling walker (2 wheeled) Gait Pattern/deviations: Step-to pattern;Ataxic;Wide base of support     General Gait Details: Pt with no LOB today with gait but unable to walk as far.  L LE did seem to get weaker as gait progressed, but no LOB like yesterday.  Difficulty with foot placement.  Close reclinet follow.  Pt c/o dizziness at end of gait. BP checked and WNL.   Stairs            Wheelchair Mobility    Modified Rankin (Stroke  Patients Only)       Balance           Standing balance support: Bilateral upper extremity supported Standing balance-Leahy Scale: Poor                      Cognition Arousal/Alertness: Awake/alert Behavior During Therapy: WFL for tasks assessed/performed Overall Cognitive Status: Within Functional Limits for tasks assessed               Problem Solving: Difficulty sequencing;Slow processing;Requires verbal cues      Exercises      General Comments        Pertinent Vitals/Pain Pain Assessment: 0-10 Pain Score: 8  Pain Location: headache Pain Descriptors / Indicators: Aching Pain Intervention(s): Other (comment);Monitored during session (IV team was going to let nurse know)    Home Living                      Prior Function            PT Goals (current goals can now be found in the care plan section) Acute Rehab PT Goals PT Goal Formulation: With patient Time For Goal Achievement: 05/28/15 Potential to Achieve Goals: Good Progress towards PT goals: Progressing toward goals    Frequency  Min 4X/week    PT Plan Current plan remains appropriate    Co-evaluation             End of Session Equipment Utilized During Treatment: Gait belt Activity Tolerance: Patient limited  by fatigue Patient left: in chair;with call bell/phone within reach     Time: 1120-1141 PT Time Calculation (min) (ACUTE ONLY): 21 min  Charges:  $Gait Training: 8-22 mins                    G Codes:      Shneur [place] LUBECK 05/16/2015, 1:23 PM

## 2015-05-16 NOTE — Progress Notes (Signed)
NIF (best of three) = -44  FVC (best of three) = 3.1  RT will continue to monitor.

## 2015-05-16 NOTE — Progress Notes (Signed)
Physical Therapy Treatment Patient Details Name: Donald Wieting Manske Jr. 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  Pt to receive 5 doses of IVIG. MRI of the cervical and thoracic spine showed no cord lesion     PT Comments    Nurse requesting PT to A her get pt off of BSC as NT was busy in another room.  Pt with much flatter affect than earlier session with slow responses to questions.  Nurse educated on how to A with transfers.  Follow Up Recommendations  CIR     Equipment Recommendations  Wheelchair (measurements PT);Rolling walker with 5" wheels    Recommendations for Other Services       Precautions / Restrictions Precautions Precautions: Fall Precaution Comments: absent sensation LB, absent RUE, impaired LUE Restrictions Weight Bearing Restrictions: No    Mobility  Bed Mobility Overal bed mobility: Needs Assistance Bed Mobility: Sit to Supine Rolling: Supervision   Supine to sit: Supervision Sit to supine: Min assist   General bed mobility comments: A to get LE back up on bed  Transfers Overall transfer level: Needs assistance Equipment used: Rolling walker (2 wheeled) Transfers: Sit to/from [employer] Sit to Stand: Min assist;+2 physical assistance Stand pivot transfers: Min assist;+2 safety/equipment       General transfer comment: Stood from bedside commode and RN moved commode then pt performed SPT with stepping backwards to bed.   Ambulation/Gait                 Stairs            Wheelchair Mobility    Modified Rankin (Stroke Patients Only)       Balance Overall balance assessment: Needs assistance Sitting-balance support: Feet supported Sitting balance-Leahy Scale: Fair     Standing balance support: Single extremity supported;During  functional activity Standing balance-Leahy Scale: Poor                      Cognition Arousal/Alertness: Awake/alert Behavior During Therapy: Flat affect Overall Cognitive Status: Within Functional Limits for tasks assessed               Problem Solving: Difficulty sequencing;Slow processing;Requires verbal cues      Exercises     General Comments        Pertinent Vitals/Pain Pain Assessment: 0-10 Pain Score: 8  Pain Location: headache Pain Descriptors / Indicators: Aching Pain Intervention(s): Limited activity within patient's tolerance    Home Living                      Prior Function            PT Goals (current goals can now be found in the care plan section) Acute Rehab PT Goals Patient Stated Goal: none stated:   PT Goal Formulation: With patient Time For Goal Achievement: 05/28/15 Potential to Achieve Goals: Good Progress towards PT goals: Progressing toward goals    Frequency  Min 4X/week    PT Plan Current plan remains appropriate    Co-evaluation             End of Session Equipment Utilized During Treatment: Gait belt Activity Tolerance: Patient tolerated treatment well Patient left: in bed;with nursing/sitter in room     Time: 4098-1191 PT Time Calculation (min) (ACUTE ONLY): 8  min  Charges:  $Therapeutic Activity: 8-22 mins                    G Codes:      Chad Donoghue LUBECK 05/16/2015, 5:07 PM

## 2015-05-16 NOTE — Progress Notes (Signed)
[facility]                                                                              Patient Demographics  Donald Mckay, is a 24 y.o. male, DOB - March 10, 1991, ZOX:[id number]  Admit date - 05/11/2015   Admitting Physician Eduard Clos, MD  Outpatient Primary MD for the patient is No PCP Per Patient  LOS - 5   Chief Complaint  Patient presents with  . Emesis  . Confusion        Brief HPI   Donald Carnes Buzan [name] is a 24 y.o. male with no significant past medical history presents to the ER because of increasing numbness of the extremities progressing upwards. Patient's symptoms started on 8/21 around 11 AM. Patient's initial symptoms were numbness around his foot which has progressed to his upper extremities at this time. Patient states she also has sensation loss. Denies any incontinence of urine or bowels.  patient did report nausea and vomiting for the last 2 days prior to admission. On exam patient has poor deep tendon reflexes. Denied any recent fever chills or diarrhea episodes. Denied any recent sick contacts or vaccinations. On-call neurologist Dr. Hosie Poisson was consulted. Lumbar puncture was attempted in the ER but was unsuccessful and fluoroscopic guided lumbar puncture has been ordered. CT head was unremarkable. Patient denied any difficulty speaking or swallowing or breathing.    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 Change to Dilaudid for only severe pain - Placed on bowel regimen - Explained to the patient that he needs to work with physical therapy, OOB to chair, Increase activity    Tobacco use disorder - Consult strongly for smoking cessation, chest x-ray clear  Anxiety and depression Patient reports loss of job 2 weeks ago, has significant psychological overlay into his neurological deficits Appreciate psych recommendations, placed on Wellbutrin    Constipation - No BM still, increased MiraLAX to BID, continue Colace, Dulcolax  Code Status: Full code   Family Communication: Discussed in detail with the patient, all imaging results, lab results explained to the patient    Disposition Plan: Possibly CIR on Monday   Time Spent in minutes  25 minutes  Procedures  LP  Consults   neuro  DVT Prophylaxis  SCD's  Medications  Scheduled Meds: . buPROPion  150 mg Oral Daily  . diphenhydrAMINE  25 mg Intravenous Q24H  . docusate sodium  100 mg Oral BID  . gabapentin  100 mg Oral  BID  . Immune Globulin 10%  400 mg/kg Intravenous Q24 Hr x 5  . ketorolac  15 mg Intravenous 4 times per day  . pantoprazole  40 mg Oral Q0600  . polyethylene glycol  17 g Oral Daily   Continuous Infusions:   PRN Meds:.acetaminophen, HYDROmorphone (DILAUDID) injection, oxyCODONE-acetaminophen   Antibiotics   Anti-infectives    None        Subjective:   Donald Mckay was seen and examined today.  states no significant improvement, despite IVIG. Constipation with no BM for last 3 days. Continues to complain of headache.  Denies any shortness of breath. Patient denies dizziness, chest pain, shortness of breath, abdominal pain, N/V/D/C.No fevers  Objective:   Blood pressure 145/83, pulse 62, temperature 97.7 F (36.5 C), temperature source Oral, resp. rate  16, height 6\' 1"  (1.854 m), weight 138.8 kg (306 lb), SpO2 98 %.  Wt Readings from Last 3 Encounters:  05/11/15 138.8 kg (306 lb)  05/13/15 138.801 kg (306 lb)  05/13/15 138.801 kg (306 lb)     Intake/Output Summary (Last 24 hours) at 05/16/15 1047 Last data filed at 05/16/15 0656  Gross per 24 hour  Intake    530 ml  Output   2850 ml  Net  -2320 ml    Exam  General: Alert and oriented x 3, NAD  HEENT:  PERRLA, EOMI  Neck: Supple  CVS: S1 S2 clear, RRR  Respiratory: CTAB  Abdomen: Soft, NT, ND, NBS  Ext: no cyanosis clubbing or edema  Neuro: poor sensation of lower extremities   Skin: No rashes  Psych: normal affect today, alert and oriented   Data Review   Micro Results Recent Results (from the past 240 hour(s))  Fungus Culture with Smear     Status: None (Preliminary result)   Collection Time: 05/12/15 11:35 AM  Result Value Ref Range Status   Specimen Description CSF  Final   Special Requests NONE  Final   Fungal Smear   Final    NO YEAST OR FUNGAL ELEMENTS SEEN Performed at [facility]    Culture   Final    CULTURE IN PROGRESS FOR FOUR WEEKS Performed at [facility]    Report Status PENDING  Incomplete  Gram stain     Status: None   Collection Time: 05/12/15 11:35 AM  Result Value Ref Range Status   Specimen Description CSF  Final   Special Requests NONE  Final   Gram Stain   Final    CYTOSPIN WBC PRESENT,BOTH PMN AND MONONUCLEAR NO ORGANISMS SEEN Gram Stain Report Called to,Read Back By and Verified With: EThad Ranger RN AT 1235 ON 08.23.16 BYSHUEA    Report Status 05/12/2015 FINAL  Final  CSF culture     Status: None   Collection Time: 05/12/15 11:35 AM  Result Value Ref Range Status   Specimen Description CSF  Final   Special Requests NONE  Final   Gram Stain   Final    WBC PRESENT,BOTH PMN AND MONONUCLEAR NO ORGANISMS SEEN CYTOSPIN SMEAR Gram Stain Report Called to,Read Back By and Verified With: Wynona Meals at  1235 on [id number] by [facility]    Culture   Final    NO GROWTH 3 DAYS Performed at [facility]    Report Status 05/15/2015 FINAL  Final    Radiology Reports Dg Chest 2 View  05/12/2015   CLINICAL DATA:  Weakness and increased numbness of the extremities.  EXAM: CHEST  2 VIEW  COMPARISON:  01/28/2014  FINDINGS: Cardiomediastinal silhouette is normal. Mediastinal contours appear intact.  There is no evidence of focal airspace consolidation, pleural effusion or pneumothorax.  Osseous structures are without acute abnormality. Soft tissues are grossly normal.  IMPRESSION: No radiographic evidence of acute cardiopulmonary abnormality.   Electronically Signed   By: Ted Mcalpine M.D.   On: 05/12/2015 10:21   Ct Head Wo Contrast  05/11/2015   CLINICAL DATA:  Confusion, numbness, vomiting  EXAM: CT HEAD WITHOUT CONTRAST  TECHNIQUE: Contiguous axial images were obtained from the base of the skull through the vertex without intravenous contrast.  COMPARISON:  None.  FINDINGS: No skull fracture is noted. Paranasal sinuses and mastoid air cells are unremarkable. No intracranial hemorrhage, mass effect or midline shift. No acute infarction. No hydrocephalus. No mass lesion is noted on this unenhanced scan.  IMPRESSION: No acute intracranial abnormality.   Electronically Signed   By: Natasha Mead M.D.   On: 05/11/2015 17:49   Mr Cervical Spine Wo Contrast  05/13/2015   CLINICAL DATA:  Numbness in the right hand and in both lower extremities. Dizziness. Question Guillain-Barre syndrome.  EXAM: MRI CERVICAL AND thoracic SPINE WITHOUT CONTRAST  TECHNIQUE: Multiplanar and multiecho pulse sequences of the cervical spine, to include the craniocervical junction and cervicothoracic junction, and thoracic spine, were obtained without intravenous contrast.  COMPARISON:  None.  FINDINGS: MRI CERVICAL SPINE FINDINGS  Cervical exam is mildly degraded by motion. There are minimal non-compressive disc bulges at C4-5 and  C5-6. No canal or foraminal stenosis. No abnormal cord signal. No osseous or articular focal lesion.  MRI thoracic SPINE FINDINGS  Thoracic study is also somewhat degraded by motion. Alignment of the spine is normal. No degenerative disc disease. No canal or foraminal stenosis. No signal abnormality of the spinal cord. The distal cord and conus are normal with conus tip at L1.  Guillain-Barre syndrome can present with the only finding of nerve root enhancement of the cauda equina. That cannot be evaluated on this study.  IMPRESSION: Motion degraded but otherwise unremarkable studies of the cervical and thoracic spine. No compressive pathology. No finding of Guillain-Barre syndrome on this nonenhanced study. Often, the only imaging sign of this disease is enhancement of the nerve roots of the cauda equina, which cannot be evaluated on this study.   Electronically Signed   By: Paulina Fusi M.D.   On: 05/13/2015 19:57   Mr Thoracic Spine Wo Contrast  05/13/2015   CLINICAL DATA:  Numbness in the right hand and in both lower extremities. Dizziness. Question Guillain-Barre syndrome.  EXAM: MRI CERVICAL AND thoracic SPINE WITHOUT CONTRAST  TECHNIQUE: Multiplanar and multiecho pulse sequences of the cervical spine, to include the craniocervical junction and cervicothoracic junction, and thoracic spine, were obtained without intravenous contrast.  COMPARISON:  None.  FINDINGS: MRI CERVICAL SPINE FINDINGS  Cervical exam is mildly degraded by motion. There are minimal non-compressive disc bulges at C4-5 and C5-6. No canal or foraminal stenosis. No abnormal cord signal. No osseous or articular focal lesion.  MRI thoracic SPINE FINDINGS  Thoracic study is also somewhat degraded by motion. Alignment of the spine is normal. No degenerative disc disease. No canal or foraminal stenosis. No signal abnormality of the spinal cord. The distal cord and conus are normal with conus tip at L1.  Guillain-Barre syndrome can present with  the only finding of nerve root enhancement of the cauda equina. That cannot be evaluated on this study.  IMPRESSION: Motion degraded but otherwise  unremarkable studies of the cervical and thoracic spine. No compressive pathology. No finding of Guillain-Barre syndrome on this nonenhanced study. Often, the only imaging sign of this disease is enhancement of the nerve roots of the cauda equina, which cannot be evaluated on this study.   Electronically Signed   By: Paulina Fusi M.D.   On: 05/13/2015 19:57   Dg Fluoro Guide Lumbar Puncture  05/12/2015   CLINICAL DATA:  Bilateral leg and arm weakness.  Initial encounter  EXAM: DIAGNOSTIC LUMBAR PUNCTURE UNDER FLUOROSCOPIC GUIDANCE  FLUOROSCOPY TIME:  Radiation Exposure Index (as provided by the fluoroscopic device):  If the device does not provide the exposure index:  Fluoroscopy Time (in minutes and seconds):  1 minutes 7 seconds  Number of Acquired Images:  2  PROCEDURE: Informed consent was obtained from the patient prior to the procedure, including potential complications of headache, allergy, and pain. With the patient prone, the lower back was prepped with Betadine. 1% Lidocaine was used for local anesthesia. Lumbar puncture was performed at the L5-S1 level using a 22 gauge needle with return of clear CSF with an opening pressure of 10 cm water. 7.5 ml of CSF were obtained for laboratory studies. The patient tolerated the procedure well and there were no apparent complications.  IMPRESSION: Successful lumbar puncture.   Electronically Signed   By: Genevive Bi M.D.   On: 05/12/2015 12:02    CBC  Recent Labs Lab 05/11/15 1452 05/12/15 0509 05/13/15 0444 05/14/15 0453 05/15/15 0450  WBC 7.3 6.2 5.1 4.5 5.2  HGB 15.2 13.8 13.7 13.6 13.8  HCT 45.3 41.5 41.8 40.9 40.9  PLT 250 232 205 230 233  MCV 89.2 91.4 90.7 91.1 90.9  MCH 29.9 30.4 29.7 30.3 30.7  MCHC 33.6 33.3 32.8 33.3 33.7  RDW 12.8 13.2 13.2 13.3 13.1    Chemistries   Recent  Labs Lab 05/11/15 1452 05/12/15 0509 05/13/15 0444 05/14/15 0453 05/15/15 0450 05/16/15 0518  NA 141 140 138 139 134* 136  K 3.7 3.7 3.8 3.7 4.0 4.4  CL 109 105 107 105 103 103  CO2 28 28 28 29 26 30   GLUCOSE 93 86 92 90 97 86  BUN <5* 6 8 8 7 11   CREATININE 0.80 0.98 0.93 0.92 0.96 0.94  CALCIUM 9.1 8.8* 8.6* 8.6* 8.6* 8.8*  AST 31 21  --   --   --   --   ALT 37 33  --   --   --   --   ALKPHOS 55 50  --   --   --   --   BILITOT 0.6 0.8  --   --   --   --    ------------------------------------------------------------------------------------------------------------------ estimated creatinine clearance is 178.9 mL/min (by C-G formula based on Cr of 0.94). ------------------------------------------------------------------------------------------------------------------ No results for input(s): HGBA1C in the last 72 hours. ------------------------------------------------------------------------------------------------------------------ No results for input(s): CHOL, HDL, LDLCALC, TRIG, CHOLHDL, LDLDIRECT in the last 72 hours. ------------------------------------------------------------------------------------------------------------------ No results for input(s): TSH, T4TOTAL, T3FREE, THYROIDAB in the last 72 hours.  Invalid input(s): FREET3 ------------------------------------------------------------------------------------------------------------------ No results for input(s): VITAMINB12, FOLATE, FERRITIN, TIBC, IRON, RETICCTPCT in the last 72 hours.  Coagulation profile No results for input(s): INR, PROTIME in the last 168 hours.  No results for input(s): DDIMER in the last 72 hours.  Cardiac Enzymes No results for input(s): CKMB, TROPONINI, MYOGLOBIN in the last 168 hours.  Invalid input(s): CK ------------------------------------------------------------------------------------------------------------------ Invalid input(s): POCBNP  No results for input(s): GLUCAP in  the last  72 hours.   Oluwadamilola Deliz M.D. [facility] 05/16/2015, 10:47 AM  Pager: [phone] Between 7am to 7pm - call Pager - [phone]  After 7pm go to [email or web address] - password [email or web address]  Call night coverage person covering after 7pm

## 2015-05-16 NOTE — Progress Notes (Signed)
Occupational Therapy Treatment Patient Details Name: [facility] 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  Pt to receive 5 doses of IVIG. MRI of the cervical and thoracic spine showed no cord lesion    OT comments  Pt with inconsistent performance throughout session. Pt complains of increased weakness and numbness BUE. MMT BUE @ 3+/5 throughout, however, functionally pt presents @ 4+/5 throughout.  Does not demonstrate deficits associated with sensory deficits functionally. Will contiue to follow to address established goals.   Follow Up Recommendations  CIR    Equipment Recommendations  3 in 1 bedside comode    Recommendations for Other Services      Precautions / Restrictions Precautions Precautions: Fall Precaution Comments: absent sensation LB, absent RUE, impaired LUE Restrictions Weight Bearing Restrictions: No       Mobility Bed Mobility Overal bed mobility: Needs Assistance Bed Mobility: Supine to Sit;Sit to Supine Rolling: Supervision   Supine to sit: Supervision Sit to supine: Min assist   General bed mobility comments: Required assistance to move LLE back onto bed. Able to move RLE oto bed with S. however, able to bridge to straighten pad beneath him and complete BLE raise to laterally move legs in bed.  Transfers Overall transfer level: Needs assistance Equipment used: Rolling walker (2 wheeled) Transfers: Sit to/from Stand Sit to Stand: Min assist;+2 physical assistance         General transfer comment: exaggerated slow movemetns with sit - stand    Balance Overall balance assessment: Needs assistance Sitting-balance support: Feet supported Sitting balance-Leahy Scale: Fair     Standing balance support: Single extremity supported;During functional  activity Standing balance-Leahy Scale: Poor                     ADL Overall ADL's : Needs assistance/impaired   Eating/Feeding Details (indicate cue type and reason): Pt states he needs red tubing to help him hold utensils. Pt states he is only able to hold finger foods and drops utensils. States he spilled everything on him because he was unable to hold his spoon/fork. Pt able to reach to bedside table to pick up cell phone and text without dificulty. No apparent deficits noted related to sensory dificulty                                 Functional mobility during ADLs: Minimal assistance;+2 for physical assistance;Rolling walker (sit - stand only) General ADL Comments: simulated hygiene after toileting in standing position. Pt able to release RW with 1 hand without LOB. Pt inconsistent with movement patterns, strength. Pt @ 3/5 BUE, however, able to push self up from lateral lean EOB. 2+/5 hamstrings in sitting, however, able to support self in standing without knees buckling.       Vision                     Perception     Praxis      Cognition   Behavior During Therapy: [facility] for tasks assessed/performed Overall Cognitive Status: Within Functional Limits for tasks assessed  Pt discussed how he loves to shop online and buy jewelry. 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 Pt able to maintain grasp on theraband   Shoulder Instructions       General Comments      Pertinent Vitals/ Pain        Pain Assessment: 0-10 Pain Score: 8  Pain Location: headache Pain Descriptors / Indicators: Aching Pain Intervention(s): Limited activity within patient's tolerance  Home Living                                          Prior Functioning/Environment              Frequency Min 2X/week     Progress Toward Goals  OT Goals(current goals can now be found in the care plan section)  Progress towards OT goals: Progressing toward goals  Acute Rehab OT Goals Patient Stated Goal: none stated:  agreeable to OT OT Goal Formulation: With patient Time For Goal Achievement: 05/28/15 Potential to Achieve Goals: Good ADL Goals Pt Will Perform Lower Body Bathing: with min assist;sit to/from stand;with adaptive equipment Pt Will Perform Lower Body Dressing: with min assist;with adaptive equipment;sit to/from stand Pt Will Transfer to Toilet: with min assist;with +2 assist;bedside commode;stand pivot transfer Additional ADL Goal #1: Pt will be independent with bil UE strengthening program, AROM and level 1 putty  Plan Discharge plan remains appropriate    Co-evaluation                 End of Session Equipment Utilized During Treatment: Gait belt;Rolling walker   Activity Tolerance Patient tolerated treatment well   Patient Left in bed;with call bell/phone within reach;with bed alarm set   Nurse Communication Mobility status        Time: 5366-4403 OT Time Calculation (min): 32 min  Charges: OT General Charges $OT Visit: 1 Procedure OT Treatments $Self Care/Home Management : 8-22 mins $Therapeutic Activity: 8-22 mins  Helen Cuff,HILLARY 05/16/2015, 4:00 PM   [facility], OTR/L  [phone] 05/16/2015

## 2015-05-17 LAB — BASIC METABOLIC PANEL
ANION GAP: 4 — AB (ref 5–15)
BUN: 13 mg/dL (ref 6–20)
CHLORIDE: 101 mmol/L (ref 101–111)
CO2: 30 mmol/L (ref 22–32)
Calcium: 9 mg/dL (ref 8.9–10.3)
Creatinine, Ser: 1.03 mg/dL (ref 0.61–1.24)
GFR calc non Af Amer: >60 mL/min (ref >60)
GLUCOSE: 86 mg/dL (ref 65–99)
Potassium: 4.4 mmol/L (ref 3.5–5.1)
Sodium: 135 mmol/L (ref 135–145)

## 2015-05-17 MED ORDER — SORBITOL 70 % SOLN
30.0000 mL | Freq: Once | Status: AC
  Administered 2015-05-17: 30 mL via ORAL
  Filled 2015-05-17: qty 30

## 2015-05-17 MED ORDER — BISACODYL 5 MG PO TBEC
10.0000 mg | DELAYED_RELEASE_TABLET | Freq: Once | ORAL | Status: AC
  Administered 2015-05-17: 10 mg via ORAL
  Filled 2015-05-17: qty 2

## 2015-05-17 MED ORDER — FLEET ENEMA 7-19 GM/118ML RE ENEM
1.0000 | ENEMA | Freq: Once | RECTAL | Status: DC
Start: 2015-05-17 — End: 2015-05-18

## 2015-05-17 NOTE — Progress Notes (Signed)
Pt NIF -30, FVC 2.3L, with good effort. Pt states his chest hurts at this time. RN made aware.

## 2015-05-17 NOTE — Progress Notes (Signed)
[facility]                                                                              Patient Demographics  Donald Mckay, is a 24 y.o. male, DOB - 05-07-1991, ZOX:[id number]  Admit date - 05/11/2015   Admitting Physician Donald Clos, MD  Outpatient Primary MD for the patient is No PCP Per Patient  LOS - 6   Chief Complaint  Patient presents with  . Emesis  . Confusion        Brief HPI   Donald Mckay. is a 24 y.o. male with no significant past medical history presents to the ER because of increasing numbness of the extremities progressing upwards. Patient's symptoms started on 8/21 around 11 AM. Patient's initial symptoms were numbness around his foot which has progressed to his upper extremities at this time. Patient states she also has sensation loss. Denies any incontinence of urine or bowels.  patient did report nausea and vomiting for the last 2 days prior to admission. On exam patient has poor deep tendon reflexes. Denied any recent fever chills or diarrhea episodes. Denied any recent sick contacts or vaccinations. On-call neurologist Dr. Hosie Mckay was consulted. Lumbar puncture was attempted in the ER but was unsuccessful and fluoroscopic guided lumbar puncture has been ordered. CT head was unremarkable. Patient denied any difficulty speaking or swallowing or breathing.    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  - Placed on bowel regimen - Explained to the patient that he needs to work with physical therapy, OOB to chair, Increase activity    Tobacco use disorder - Consult strongly for smoking cessation, chest x-ray clear  Anxiety and depression Patient reports loss of job 2 weeks ago, has significant psychological overlay into his neurological deficits Appreciate psych recommendations, placed on Wellbutrin   Constipation likely due to narcotics - No BM still, continue MiraLAX, Colace, Dulcolax, sorbitol today. He declines enema.   Code Status: Full code   Family Communication: Discussed in detail with the patient, all imaging results, lab results explained to the patient    Disposition Plan: Possibly CIR on Monday   Time Spent in minutes  25 minutes  Procedures  LP  Consults   neuro  DVT Prophylaxis  SCD's  Medications  Scheduled Meds: . buPROPion  150 mg Oral Daily  . docusate sodium  100 mg Oral BID  . gabapentin  100 mg Oral BID  . ketorolac  15 mg Intravenous 4  times per day  . pantoprazole  40 mg Oral Q0600  . polyethylene glycol  17 g Oral BID   Continuous Infusions:   PRN Meds:.acetaminophen, HYDROmorphone (DILAUDID) injection, oxyCODONE-acetaminophen   Antibiotics   Anti-infectives    None        Subjective:   Donald Mckay was seen and examined today.  Headache better, still no BM, feels not much of a difference with his numbness in the legs.  Denies any shortness of breath. Patient denies dizziness, chest pain, shortness of breath, abdominal pain, N/V/D/C. afebrile  Objective:   Blood pressure 150/78, pulse 74, temperature 97.5 F (36.4 C), temperature source Oral, resp. rate 20, height 6\' 1"  (1.854 m), weight 138.8 kg (306 lb), SpO2 99 %.  Wt Readings from Last 3 Encounters:  05/11/15 138.8 kg (306 lb)   05/13/15 138.801 kg (306 lb)  05/13/15 138.801 kg (306 lb)     Intake/Output Summary (Last 24 hours) at 05/17/15 1047 Last data filed at 05/17/15 1610  Gross per 24 hour  Intake   2680 ml  Output   3725 ml  Net  -1045 ml    Exam  General: Alert and oriented x 3, NAD  HEENT:  PERRLA, EOMI  Neck: Supple  CVS: S1 S2 clear, RRR  Respiratory clear to auscultation bilaterally  Abdomen: Soft, NT, ND, NBS  Ext: no cyanosis clubbing or edema  Neuro: poor sensation of lower extremities   Skin: No rashes  Psych: alert and oriented 3   Data Review   Micro Results Recent Results (from the past 240 hour(s))  Fungus Culture with Smear     Status: None (Preliminary result)   Collection Time: 05/12/15 11:35 AM  Result Value Ref Range Status   Specimen Description CSF  Final   Special Requests NONE  Final   Fungal Smear   Final    NO YEAST OR FUNGAL ELEMENTS SEEN Performed at [facility]    Culture   Final    CULTURE IN PROGRESS FOR FOUR WEEKS Performed at [facility]    Report Status PENDING  Incomplete  Gram stain     Status: None   Collection Time: 05/12/15 11:35 AM  Result Value Ref Range Status   Specimen Description CSF  Final   Special Requests NONE  Final   Gram Stain   Final    CYTOSPIN WBC PRESENT,BOTH PMN AND MONONUCLEAR NO ORGANISMS SEEN Gram Stain Report Called to,Read Back By and Verified With: EThad Ranger RN AT 1235 ON 08.23.16 BYSHUEA    Report Status 05/12/2015 FINAL  Final  CSF culture     Status: None   Collection Time: 05/12/15 11:35 AM  Result Value Ref Range Status   Specimen Description CSF  Final   Special Requests NONE  Final   Gram Stain   Final    WBC PRESENT,BOTH PMN AND MONONUCLEAR NO ORGANISMS SEEN CYTOSPIN SMEAR Gram Stain Report Called to,Read Back By and Verified With: Wynona Meals at 1235 on [id number] by [facility]    Culture   Final    NO GROWTH 3 DAYS Performed at [facility]    Report Status  05/15/2015 FINAL  Final    Radiology Reports Dg Chest 2 View  05/12/2015   CLINICAL DATA:  Weakness and increased numbness of the extremities.  EXAM: CHEST  2 VIEW  COMPARISON:  01/28/2014  FINDINGS: Cardiomediastinal silhouette is normal. Mediastinal contours appear intact.  There is no evidence of focal airspace consolidation, pleural  effusion or pneumothorax.  Osseous structures are without acute abnormality. Soft tissues are grossly normal.  IMPRESSION: No radiographic evidence of acute cardiopulmonary abnormality.   Electronically Signed   By: Ted Mcalpine M.D.   On: 05/12/2015 10:21   Ct Head Wo Contrast  05/11/2015   CLINICAL DATA:  Confusion, numbness, vomiting  EXAM: CT HEAD WITHOUT CONTRAST  TECHNIQUE: Contiguous axial images were obtained from the base of the skull through the vertex without intravenous contrast.  COMPARISON:  None.  FINDINGS: No skull fracture is noted. Paranasal sinuses and mastoid air cells are unremarkable. No intracranial hemorrhage, mass effect or midline shift. No acute infarction. No hydrocephalus. No mass lesion is noted on this unenhanced scan.  IMPRESSION: No acute intracranial abnormality.   Electronically Signed   By: Natasha Mead M.D.   On: 05/11/2015 17:49   Mr Cervical Spine Wo Contrast  05/13/2015   CLINICAL DATA:  Numbness in the right hand and in both lower extremities. Dizziness. Question Guillain-Barre syndrome.  EXAM: MRI CERVICAL AND thoracic SPINE WITHOUT CONTRAST  TECHNIQUE: Multiplanar and multiecho pulse sequences of the cervical spine, to include the craniocervical junction and cervicothoracic junction, and thoracic spine, were obtained without intravenous contrast.  COMPARISON:  None.  FINDINGS: MRI CERVICAL SPINE FINDINGS  Cervical exam is mildly degraded by motion. There are minimal non-compressive disc bulges at C4-5 and C5-6. No canal or foraminal stenosis. No abnormal cord signal. No osseous or articular focal lesion.  MRI thoracic SPINE  FINDINGS  Thoracic study is also somewhat degraded by motion. Alignment of the spine is normal. No degenerative disc disease. No canal or foraminal stenosis. No signal abnormality of the spinal cord. The distal cord and conus are normal with conus tip at L1.  Guillain-Barre syndrome can present with the only finding of nerve root enhancement of the cauda equina. That cannot be evaluated on this study.  IMPRESSION: Motion degraded but otherwise unremarkable studies of the cervical and thoracic spine. No compressive pathology. No finding of Guillain-Barre syndrome on this nonenhanced study. Often, the only imaging sign of this disease is enhancement of the nerve roots of the cauda equina, which cannot be evaluated on this study.   Electronically Signed   By: Paulina Fusi M.D.   On: 05/13/2015 19:57   Mr Thoracic Spine Wo Contrast  05/13/2015   CLINICAL DATA:  Numbness in the right hand and in both lower extremities. Dizziness. Question Guillain-Barre syndrome.  EXAM: MRI CERVICAL AND thoracic SPINE WITHOUT CONTRAST  TECHNIQUE: Multiplanar and multiecho pulse sequences of the cervical spine, to include the craniocervical junction and cervicothoracic junction, and thoracic spine, were obtained without intravenous contrast.  COMPARISON:  None.  FINDINGS: MRI CERVICAL SPINE FINDINGS  Cervical exam is mildly degraded by motion. There are minimal non-compressive disc bulges at C4-5 and C5-6. No canal or foraminal stenosis. No abnormal cord signal. No osseous or articular focal lesion.  MRI thoracic SPINE FINDINGS  Thoracic study is also somewhat degraded by motion. Alignment of the spine is normal. No degenerative disc disease. No canal or foraminal stenosis. No signal abnormality of the spinal cord. The distal cord and conus are normal with conus tip at L1.  Guillain-Barre syndrome can present with the only finding of nerve root enhancement of the cauda equina. That cannot be evaluated on this study.  IMPRESSION: Motion  degraded but otherwise unremarkable studies of the cervical and thoracic spine. No compressive pathology. No finding of Guillain-Barre syndrome on this nonenhanced study. Often, the only  imaging sign of this disease is enhancement of the nerve roots of the cauda equina, which cannot be evaluated on this study.   Electronically Signed   By: Paulina Fusi M.D.   On: 05/13/2015 19:57   Dg Fluoro Guide Lumbar Puncture  05/12/2015   CLINICAL DATA:  Bilateral leg and arm weakness.  Initial encounter  EXAM: DIAGNOSTIC LUMBAR PUNCTURE UNDER FLUOROSCOPIC GUIDANCE  FLUOROSCOPY TIME:  Radiation Exposure Index (as provided by the fluoroscopic device):  If the device does not provide the exposure index:  Fluoroscopy Time (in minutes and seconds):  1 minutes 7 seconds  Number of Acquired Images:  2  PROCEDURE: Informed consent was obtained from the patient prior to the procedure, including potential complications of headache, allergy, and pain. With the patient prone, the lower back was prepped with Betadine. 1% Lidocaine was used for local anesthesia. Lumbar puncture was performed at the L5-S1 level using a 22 gauge needle with return of clear CSF with an opening pressure of 10 cm water. 7.5 ml of CSF were obtained for laboratory studies. The patient tolerated the procedure well and there were no apparent complications.  IMPRESSION: Successful lumbar puncture.   Electronically Signed   By: Genevive Bi M.D.   On: 05/12/2015 12:02    CBC  Recent Labs Lab 05/11/15 1452 05/12/15 0509 05/13/15 0444 05/14/15 0453 05/15/15 0450  WBC 7.3 6.2 5.1 4.5 5.2  HGB 15.2 13.8 13.7 13.6 13.8  HCT 45.3 41.5 41.8 40.9 40.9  PLT 250 232 205 230 233  MCV 89.2 91.4 90.7 91.1 90.9  MCH 29.9 30.4 29.7 30.3 30.7  MCHC 33.6 33.3 32.8 33.3 33.7  RDW 12.8 13.2 13.2 13.3 13.1    Chemistries   Recent Labs Lab 05/11/15 1452 05/12/15 0509 05/13/15 0444 05/14/15 0453 05/15/15 0450 05/16/15 0518 05/17/15 0519  NA 141 140  138 139 134* 136 135  K 3.7 3.7 3.8 3.7 4.0 4.4 4.4  CL 109 105 107 105 103 103 101  CO2 GLUCOSE 93 86 92 90 97 86 86  BUN <5* CREATININE 0.80 0.98 0.93 0.92 0.96 0.94 1.03  CALCIUM 9.1 8.8* 8.6* 8.6* 8.6* 8.8* 9.0  AST 31 21  --   --   --   --   --   ALT 37 33  --   --   --   --   --   ALKPHOS 55 50  --   --   --   --   --   BILITOT 0.6 0.8  --   --   --   --   --    ------------------------------------------------------------------------------------------------------------------ estimated creatinine clearance is 163.3 mL/min (by C-G formula based on Cr of 1.03). ------------------------------------------------------------------------------------------------------------------ No results for input(s): HGBA1C in the last 72 hours. ------------------------------------------------------------------------------------------------------------------ No results for input(s): CHOL, HDL, LDLCALC, TRIG, CHOLHDL, LDLDIRECT in the last 72 hours. ------------------------------------------------------------------------------------------------------------------ No results for input(s): TSH, T4TOTAL, T3FREE, THYROIDAB in the last 72 hours.  Invalid input(s): FREET3 ------------------------------------------------------------------------------------------------------------------ No results for input(s): VITAMINB12, FOLATE, FERRITIN, TIBC, IRON, RETICCTPCT in the last 72 hours.  Coagulation profile No results for input(s): INR, PROTIME in the last 168 hours.  No results for input(s): DDIMER in the last 72 hours.  Cardiac Enzymes No results for input(s): CKMB, TROPONINI, MYOGLOBIN in the last 168 hours.  Invalid input(s): CK ------------------------------------------------------------------------------------------------------------------ Invalid input(s): POCBNP  No results for input(s): GLUCAP in the last 72 hours.  RAI,RIPUDEEP M.D. [facility] 05/17/2015, 10:47 AM  Pager: [phone] Between 7am to 7pm - call Pager - [phone]  After 7pm go to [email or web address] - password [email or web address]  Call night coverage person covering after 7pm

## 2015-05-17 NOTE — Progress Notes (Signed)
Patient achieved -54 NIF and 3.5 VC with great patient effort.

## 2015-05-18 LAB — BASIC METABOLIC PANEL
ANION GAP: 5 (ref 5–15)
BUN: 14 mg/dL (ref 6–20)
CALCIUM: 9.1 mg/dL (ref 8.9–10.3)
CO2: 30 mmol/L (ref 22–32)
CREATININE: 0.97 mg/dL (ref 0.61–1.24)
Chloride: 101 mmol/L (ref 101–111)
GFR calc Af Amer: >60 mL/min (ref >60)
GLUCOSE: 86 mg/dL (ref 65–99)
Potassium: 4.8 mmol/L (ref 3.5–5.1)
Sodium: 136 mmol/L (ref 135–145)

## 2015-05-18 MED ORDER — POLYETHYLENE GLYCOL 3350 17 G PO PACK: 17.0000 g | PACK | Freq: Every day | ORAL | Status: DC | PRN

## 2015-05-18 MED ORDER — GABAPENTIN 100 MG PO CAPS: 100.0000 mg | ORAL_CAPSULE | Freq: Three times a day (TID) | ORAL | Status: DC

## 2015-05-18 MED ORDER — BUPROPION HCL ER (XL) 150 MG PO TB24: 150.0000 mg | ORAL_TABLET | Freq: Every day | ORAL | Status: DC

## 2015-05-18 MED ORDER — TRAZODONE HCL 50 MG PO TABS: 50.0000 mg | ORAL_TABLET | Freq: Every day | ORAL | Status: DC

## 2015-05-18 MED ORDER — DOCUSATE SODIUM 100 MG PO CAPS: 100.0000 mg | ORAL_CAPSULE | Freq: Two times a day (BID) | ORAL | Status: DC

## 2015-05-18 MED ORDER — OXYCODONE-ACETAMINOPHEN 5-325 MG PO TABS: 1.0000 | ORAL_TABLET | ORAL | Status: DC | PRN

## 2015-05-18 MED ORDER — ACETAMINOPHEN 325 MG PO TABS: 650.0000 mg | ORAL_TABLET | Freq: Four times a day (QID) | ORAL | Status: DC | PRN

## 2015-05-18 NOTE — Progress Notes (Signed)
Clinical Social Work  CSW and psych MD rounded on patient together. Patient has been compliant with Wellbutrin but reports no notice in symptoms at this time. Patient agreeable to continue to take medication. Outpatient follow up provided and placed on AVS for patient.  [place], [place] [phone]

## 2015-05-18 NOTE — Progress Notes (Addendum)
Inpatient Rehabilitation   I met at length with Donald Mckay to discuss his post acute rehab needs.  I arrived just following PT session with [name] who states pt. able to ambulate today with min guard and occasional min assist to steady and use of RW.  I advised pt. that he is not requiring the intensity of our rehab program for continued functional progress.    In questioning this patient regarding his support network, he says he has one friend in [place] and one friend locally who works "all the time".  His mom reportedly stays "at her clients' houses" as a Insurance claims handler" and is not local.  He states his grandmother was living with him prior to his admission to the hospital but "they are trying to put her in a home" because "she confuses her medicines". I encouraged that pt. maximize all his therapy sessions while here to continue to progress as much as able.  Pt. With flat affect and seems "detached" with regard to his care.  Did not appear to take an active role in his care during my conversation with him.    I discussed this case with Dr. Tana Coast and with Gabriel Earing, CM that he does not meet the need for IP Rehab intensity.  I will sign off this case.  Please call if questions.    [place] Admissions Coordinator Cell [phone] Office [phone]

## 2015-05-18 NOTE — Progress Notes (Signed)
Physical Therapy Treatment Patient Details Name: Donald Slawinski Didion Jr. 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  Pt to receive 5 doses of IVIG. MRI of the cervical and thoracic spine showed no cord lesion     PT Comments    Pt assisted with ambulating again today and able to tolerate slight increase in distance.  Pt requiring min assist occasionally for balance.  Darl Pikes, from [facility] coming in to speak with pt end of session.  Since pt unable to d/c to CIR, recommend at least assist for mobility at home.     Follow Up Recommendations  Supervision for mobility/OOB;Home health PT     Equipment Recommendations  Wheelchair (measurements PT);Rolling walker with 5" wheels    Recommendations for Other Services       Precautions / Restrictions Precautions Precautions: Fall Precaution Comments: absent sensation LB, absent RUE, impaired LUE Restrictions Weight Bearing Restrictions: No    Mobility  Bed Mobility Overal bed mobility: Needs Assistance Bed Mobility: Supine to Sit     Supine to sit: Supervision;HOB elevated     General bed mobility comments: pt adjusted bed to self assist  Transfers Overall transfer level: Needs assistance Equipment used: Rolling walker (2 wheeled) Transfers: Sit to/from Stand Sit to Stand: Min assist;+2 safety/equipment         General transfer comment: posterior LOB upon standing requiring assist to correct  Ambulation/Gait Ambulation/Gait assistance: Min assist;+2 safety/equipment Ambulation Distance (Feet): 25 Feet Assistive device: Rolling walker (2 wheeled) Gait Pattern/deviations: Step-to pattern;Wide base of support     General Gait Details: no cues for gait sequence required today however pt still reports L LE feels as though it may buckle, increased weight  bearing on UEs through RW for steadying/support, 3-4 instances of posterior LOB requiring assist, recliner following for safety   Stairs            Wheelchair Mobility    Modified Rankin (Stroke Patients Only)       Balance                                    Cognition Arousal/Alertness: Awake/alert Behavior During Therapy: Flat affect                 Problem Solving: Requires verbal cues      Exercises      General Comments        Pertinent Vitals/Pain Pain Assessment: 0-10 Pain Score: 3  Pain Location: headache Pain Descriptors / Indicators: Headache Pain Intervention(s): Limited activity within patient's tolerance;Monitored during session;Repositioned    Home Living                      Prior Function            PT Goals (current goals can now be found in the care plan section) Progress towards PT goals: Progressing toward goals    Frequency  Min 4X/week    PT Plan Current plan remains appropriate    Co-evaluation             End of Session Equipment Utilized During Treatment: Gait belt Activity Tolerance: Patient tolerated treatment well Patient left: in chair;with call bell/phone within reach     Time: 1100-1119 PT Time  Calculation (min) (ACUTE ONLY): 19 min  Charges:  $Gait Training: 8-22 mins                    G Codes:      Malarie Tappen,KATHrine E 2015-06-15, 12:55 PM Zenovia Jarred, PT, DPT Jun 15, 2015 Pager: [phone]

## 2015-05-18 NOTE — Progress Notes (Signed)
NIF -40 VC  2.3L Good Pt effort

## 2015-05-18 NOTE — Discharge Summary (Signed)
Physician Discharge Summary   Patient ID: Donald Carnes Krell Jr. MRN: [id number] DOB/AGE: Feb 28, 1991 24 y.o.  Admit date: 05/11/2015 Discharge date: 05/18/2015  Primary Care Physician:  No PCP Per Patient  Discharge Diagnoses:    . progressive Numbness, early Guillain-Barr syndrome  . Tobacco use disorder Depression Constipation  Consults: Neurology, Dr. Roseanne Reno Inpatient rehabilitation   Recommendations for Outpatient Follow-up:  Patient completed 5 doses of IVIG  TESTS THAT NEED FOLLOW-UP None  DIET: Heart healthy diet    Allergies:   Allergies  Allergen Reactions  . Ibuprofen     Nose bleeds     Discharge Medications:   Medication List    STOP taking these medications        cephALEXin 500 MG capsule  Commonly known as:  KEFLEX     hydrocortisone 2.5 % lotion     permethrin 5 % cream  Commonly known as:  ACTICIN      TAKE these medications        acetaminophen 325 MG tablet  Commonly known as:  TYLENOL  Take 2 tablets (650 mg total) by mouth every 6 (six) hours as needed for moderate pain or headache.     buPROPion 150 MG 24 hr tablet  Commonly known as:  WELLBUTRIN XL  Take 1 tablet (150 mg total) by mouth daily.     docusate sodium 100 MG capsule  Commonly known as:  COLACE  Take 1 capsule (100 mg total) by mouth 2 (two) times daily.     gabapentin 100 MG capsule  Commonly known as:  NEURONTIN  Take 1 capsule (100 mg total) by mouth 3 (three) times daily.     oxyCODONE-acetaminophen 5-325 MG per tablet  Commonly known as:  PERCOCET/ROXICET  Take 1-2 tablets by mouth every 4 (four) hours as needed for moderate pain or severe pain.     polyethylene glycol packet  Commonly known as:  MIRALAX / GLYCOLAX  Take 17 g by mouth daily as needed for moderate constipation.         Brief H and P: For complete details please refer to admission H and P, but in brief Donald Mckay. is a 24 y.o. male with no significant past  medical history presents to the ER because of increasing numbness of the extremities progressing upwards. Patient's symptoms started on 8/21 around 11 AM. Patient's initial symptoms were numbness around his foot which has progressed to his upper extremities at this time. Patient states she also has sensation loss. Denies any incontinence of urine or bowels. patient did report nausea and vomiting for the last 2 days prior to admission. On exam patient has poor deep tendon reflexes. Denied any recent fever chills or diarrhea episodes. Denied any recent sick contacts or vaccinations. On-call neurologist Dr. Hosie Poisson was consulted. Lumbar puncture was attempted in the ER but was unsuccessful and fluoroscopic guided lumbar puncture has been ordered. CT head was unremarkable. Patient denied any difficulty speaking or swallowing or breathing.   Hospital Course:  Progressive Numbness from lower extremities upwards: Concerning for Guillian Barre syndrome Neurology was consulted. Patient underwent Spinal tap done under fluoroscopy on 8/23 showed CSF protein elevated with normal WBC count, indicative of early GBS. Neurology started the patient on IVIG for 5 days, has completed 5 treatments on 8/27. Serial neuro checks and NIF per respiratory were done, remained stable. MRI of the cervical and thoracic spine showed no cord lesion. Per neurology, does not need MRI of the lumbar spine  at this time. There was a concern that patient probably has some psychological overlay into his neurological deficits due to depression and stress. Psychiatry consultation was obtained and patient was started on Wellbutrin. Patient went through PT evaluations during the hospitalization, inpatient rehabilitation was also consulted. PT evaluation on 8/29 however showed that patient was able to ambulate with minimal guard and occasional minimal assist to steady and use of rolling walker. Hence patient was not requiring the intensity of the  rehabilitation program for continued functional progress and was declined. Home PT, OT, home RN nurse was arranged by the case manager.   Pain control: Patient continues to complain of pain, headache since LP.  He refused tramadol (stating that it doesn't work), was placed on Neurontin, Toradol and Dilaudid. Patient continued to ask for Dilaudid consistently. He was also placed on bowel regimen.   Tobacco use disorder Patient was counseled on smoking cessation, nicotine patch was placed during hospitalization.  Anxiety and depression Patient reports loss of job 2 weeks ago, has significant psychological overlay into his neurological deficits. Psychiatry consultation was obtained and patient was placed on Wellbutrin.   Constipation likely due to narcotics resolved - Patient was placed on Colace, Dulcolax and MiraLAX. He had a BM on 8/28 per patient.   Day of Discharge BP 131/77 mmHg  Pulse 72  Temp(Src) 97.7 F (36.5 C) (Oral)  Resp 18  Ht  (1.854 m)  Wt 138.8 kg (306 lb)  BMI 40.38 kg/m2  SpO2 99%  Physical Exam: General: Alert and awake oriented x3 not in any acute distress., Flat affect HEENT: anicteric sclera, pupils reactive to light and accommodation CVS: S1-S2 clear no murmur rubs or gallops Chest: clear to auscultation bilaterally, no wheezing rales or rhonchi Abdomen: soft nontender, nondistended, normal bowel sounds Extremities: no cyanosis, clubbing or edema noted bilaterally    The results of significant diagnostics from this hospitalization (including imaging, microbiology, ancillary and laboratory) are listed below for reference.    LAB RESULTS: Basic Metabolic Panel:  Recent Labs Lab 05/17/15 0519 05/18/15 0434  NA 135 136  K 4.4 4.8  CL 101 101  CO2 30 30  GLUCOSE 86 86  BUN 13 14  CREATININE 1.03 0.97  CALCIUM 9.0 9.1   Liver Function Tests:  Recent Labs Lab 05/11/15 1452 05/12/15 0509  AST 31 21  ALT 37 33  ALKPHOS 55 50   BILITOT 0.6 0.8  PROT 6.6 5.9*  ALBUMIN 4.1 3.7    Recent Labs Lab 05/11/15 1452  LIPASE 25    Recent Labs Lab 05/11/15 1800  AMMONIA 46*   CBC:  Recent Labs Lab 05/14/15 0453 05/15/15 0450  WBC 4.5 5.2  HGB 13.6 13.8  HCT 40.9 40.9  MCV 91.1 90.9  PLT 230 233   Cardiac Enzymes: No results for input(s): CKTOTAL, CKMB, CKMBINDEX, TROPONINI in the last 168 hours. BNP: Invalid input(s): POCBNP CBG: No results for input(s): GLUCAP in the last 168 hours.  Significant Diagnostic Studies:  Dg Chest 2 View  05/12/2015   CLINICAL DATA:  Weakness and increased numbness of the extremities.  EXAM: CHEST  2 VIEW  COMPARISON:  01/28/2014  FINDINGS: Cardiomediastinal silhouette is normal. Mediastinal contours appear intact.  There is no evidence of focal airspace consolidation, pleural effusion or pneumothorax.  Osseous structures are without acute abnormality. Soft tissues are grossly normal.  IMPRESSION: No radiographic evidence of acute cardiopulmonary abnormality.   Electronically Signed   By: Ted Mcalpine M.D.   On:  05/12/2015 10:21   Ct Head Wo Contrast  05/11/2015   CLINICAL DATA:  Confusion, numbness, vomiting  EXAM: CT HEAD WITHOUT CONTRAST  TECHNIQUE: Contiguous axial images were obtained from the base of the skull through the vertex without intravenous contrast.  COMPARISON:  None.  FINDINGS: No skull fracture is noted. Paranasal sinuses and mastoid air cells are unremarkable. No intracranial hemorrhage, mass effect or midline shift. No acute infarction. No hydrocephalus. No mass lesion is noted on this unenhanced scan.  IMPRESSION: No acute intracranial abnormality.   Electronically Signed   By: Natasha Mead M.D.   On: 05/11/2015 17:49   Dg Fluoro Guide Lumbar Puncture  05/12/2015   CLINICAL DATA:  Bilateral leg and arm weakness.  Initial encounter  EXAM: DIAGNOSTIC LUMBAR PUNCTURE UNDER FLUOROSCOPIC GUIDANCE  FLUOROSCOPY TIME:  Radiation Exposure Index (as provided by  the fluoroscopic device):  If the device does not provide the exposure index:  Fluoroscopy Time (in minutes and seconds):  1 minutes 7 seconds  Number of Acquired Images:  2  PROCEDURE: Informed consent was obtained from the patient prior to the procedure, including potential complications of headache, allergy, and pain. With the patient prone, the lower back was prepped with Betadine. 1% Lidocaine was used for local anesthesia. Lumbar puncture was performed at the L5-S1 level using a 22 gauge needle with return of clear CSF with an opening pressure of 10 cm water. 7.5 ml of CSF were obtained for laboratory studies. The patient tolerated the procedure well and there were no apparent complications.  IMPRESSION: Successful lumbar puncture.   Electronically Signed   By: Genevive Bi M.D.   On: 05/12/2015 12:02    2D ECHO:   Disposition and Follow-up: Discharge Instructions    Diet - low sodium heart healthy    Complete by:  As directed      Increase activity slowly    Complete by:  As directed             DISPOSITION: Home with home PT, OT, RN   DISCHARGE FOLLOW-UP Follow-up Information    Follow up with [facility].   Specialty:  Behavioral Health   Why:  Walk-in clinic 8am-12 pm Monday thru Friday   Contact information:   [address] [place] [address] [phone]       Follow up with [facility]    . Schedule an appointment as soon as possible for a visit in 10 days.   Why:  for hospital follow-up   Contact information:   [address] [place] [address] [phone]       Time spent on Discharge: 35 minutes  Signed:   Halen Antenucci M.D. [facility] 05/18/2015, 12:36 PM Pager: [phone]

## 2015-05-18 NOTE — Progress Notes (Signed)
CSW received notification from [facility] that pt medically ready for discharge today and had expressed to [facility] that his family member was stating that he could not return home.   CSW met with pt at bedside. CSW introduced self and explained role. CSW inquired with pt pt plan upon discharge. Pt states the he plans to return home to his mother's home. Pt stated that he was not concerned about a place to go, but stated that pt mother had expressed to pt that she was unsure if pt could have home health come to the home. CSW encouraged pt to discuss with pt mother to at least allow home health to come to the home to assess pt needs and provide pt with exercises. Pt stated that pt mother was asking about rehabilitation facility and CSW discussed that pt and pt mother would have to pay privately for rehab facility. Pt stated that this would not be an option.  CSW notified pt that CSW will notify RNCM in order for RNCM to have home health agency provide pt with contact information for pt to contact for services once he is able to further discuss with his mother.   Pt confirmed that he has transportation to return home.   No further social work needs identified at this time.  CSW signing off.   Alison Murray, MSW, [place] [facility] [phone]
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
## 2015-05-20 ENCOUNTER — Emergency Department (HOSPITAL_COMMUNITY): Payer: Self-pay

## 2015-05-20 ENCOUNTER — Emergency Department (HOSPITAL_BASED_OUTPATIENT_CLINIC_OR_DEPARTMENT_OTHER): Payer: Medicaid Other

## 2015-05-20 ENCOUNTER — Emergency Department (HOSPITAL_COMMUNITY)
Admission: EM | Admit: 2015-05-20 | Discharge: 2015-05-20 | Disposition: A | Discharge status: Home / Self Care | Payer: Medicaid Other | Attending: Emergency Medicine | Admitting: Emergency Medicine

## 2015-05-20 ENCOUNTER — Encounter: Payer: Self-pay | Admitting: Family Medicine

## 2015-05-20 ENCOUNTER — Encounter (HOSPITAL_COMMUNITY): Payer: Self-pay

## 2015-05-20 DIAGNOSIS — M6281 Muscle weakness (generalized): Secondary | ICD-10-CM | POA: Insufficient documentation

## 2015-05-20 DIAGNOSIS — R531 Weakness: Secondary | ICD-10-CM

## 2015-05-20 DIAGNOSIS — Z720 Tobacco use: Secondary | ICD-10-CM | POA: Insufficient documentation

## 2015-05-20 DIAGNOSIS — M7989 Other specified soft tissue disorders: Secondary | ICD-10-CM

## 2015-05-20 DIAGNOSIS — R0602 Shortness of breath: Secondary | ICD-10-CM | POA: Diagnosis not present

## 2015-05-20 DIAGNOSIS — G610 Guillain-Barre syndrome: Secondary | ICD-10-CM | POA: Diagnosis not present

## 2015-05-20 DIAGNOSIS — Z79899 Other long term (current) drug therapy: Secondary | ICD-10-CM | POA: Insufficient documentation

## 2015-05-20 LAB — BASIC METABOLIC PANEL WITH GFR
Anion gap: 9 (ref 5–15)
BUN: 14 mg/dL (ref 6–20)
CO2: 21 mmol/L — ABNORMAL LOW (ref 22–32)
Calcium: 9.5 mg/dL (ref 8.9–10.3)
Chloride: 107 mmol/L (ref 101–111)
Creatinine, Ser: 0.85 mg/dL (ref 0.61–1.24)
GFR calc Af Amer: >60 mL/min
GFR calc non Af Amer: >60 mL/min
Glucose, Bld: 95 mg/dL (ref 65–99)
Potassium: 4.1 mmol/L (ref 3.5–5.1)
Sodium: 137 mmol/L (ref 135–145)

## 2015-05-20 LAB — CBC WITH DIFFERENTIAL/PLATELET
Basophils Absolute: 0.1 K/uL (ref 0.0–0.1)
Basophils Relative: 1 % (ref 0–1)
Eosinophils Absolute: 0.4 K/uL (ref 0.0–0.7)
Eosinophils Relative: 4 % (ref 0–5)
HCT: 42.1 % (ref 39.0–52.0)
Hemoglobin: 14.9 g/dL (ref 13.0–17.0)
Lymphocytes Relative: 30 % (ref 12–46)
Lymphs Abs: 2.8 K/uL (ref 0.7–4.0)
MCH: 31.1 pg (ref 26.0–34.0)
MCHC: 35.4 g/dL (ref 30.0–36.0)
MCV: 87.9 fL (ref 78.0–100.0)
Monocytes Absolute: 0.9 K/uL (ref 0.1–1.0)
Monocytes Relative: 9 % (ref 3–12)
Neutro Abs: 5.2 K/uL (ref 1.7–7.7)
Neutrophils Relative %: 56 % (ref 43–77)
Platelets: 263 K/uL (ref 150–400)
RBC: 4.79 MIL/uL (ref 4.22–5.81)
RDW: 13.3 % (ref 11.5–15.5)
WBC: 9.3 K/uL (ref 4.0–10.5)

## 2015-05-20 LAB — TROPONIN I: Troponin I: <0.03 ng/mL

## 2015-05-20 LAB — D-DIMER, QUANTITATIVE: D-Dimer, Quant: 0.56 ug{FEU}/mL — ABNORMAL HIGH (ref 0.00–0.48)

## 2015-05-20 MED ORDER — ONDANSETRON HCL 4 MG PO TABS
4.0000 mg | ORAL_TABLET | Freq: Once | ORAL | Status: AC
  Administered 2015-05-20: 4 mg via ORAL
  Filled 2015-05-20: qty 1

## 2015-05-20 MED ORDER — HYDROCODONE-ACETAMINOPHEN 5-325 MG PO TABS
1.0000 | ORAL_TABLET | Freq: Once | ORAL | Status: AC
  Administered 2015-05-20: 1 via ORAL
  Filled 2015-05-20: qty 1

## 2015-05-20 MED ORDER — IOHEXOL 350 MG/ML SOLN
100.0000 mL | Freq: Once | INTRAVENOUS | Status: AC | PRN
  Administered 2015-05-20: 100 mL via INTRAVENOUS

## 2015-05-20 MED ORDER — ACETAMINOPHEN 500 MG PO TABS
500.0000 mg | ORAL_TABLET | Freq: Once | ORAL | Status: AC
  Administered 2015-05-20: 500 mg via ORAL
  Filled 2015-05-20: qty 1

## 2015-05-20 NOTE — ED Provider Notes (Signed)
CSN: [id number]     Arrival date & time 05/20/15  1632 History   First MD Initiated Contact with Patient 05/20/15 1633     Chief Complaint  Patient presents with  . Leg Swelling  . Shortness of Breath     (Consider location/radiation/quality/duration/timing/severity/associated sxs/prior Treatment) HPI Comments: Donald Mckay is a 24 y.o M with a recent admission for GBS (discharged on 8/29) who presents to the ED c/o leg swelling and SOB that began yesterday. Pt states that when he was discharged 2 days ago and at the time was still "unable to feel his legs". Pt states that yesterday both of his legs spontaneously swelled up and became very red. The swelling diminished after a few hours. Pt states that his left leg has since felt "very heavy" and he has had difficulty lifting and moving it.Today pt states that he is now having "chest cramping" and shortness of breath upon standing as well as intermittent blurry vision and headache. Denies syncope, vomiting, fevers, chills, cool or pale extremities, neck stiffness, abdominal pain.   Per the hospitalist's discharge summary note:   Hospital Course:  Progressive Numbness from lower extremities upwards: Concerning for Guillian Barre syndrome Neurology was consulted. Patient underwent Spinal tap done under fluoroscopy on 8/23 showed CSF protein elevated with normal WBC count, indicative of early GBS. Neurology started the patient on IVIG for 5 days, has completed 5 treatments on 8/27. Serial neuro checks and NIF per respiratory were done, remained stable. MRI of the cervical and thoracic spine showed no cord lesion. Per neurology, does not need MRI of the lumbar spine at this time. There was a concern that patient probably has some psychological overlay into his neurological deficits due to depression and stress. Psychiatry consultation was obtained and patient was started on Wellbutrin. Patient went through PT evaluations during the hospitalization,  inpatient rehabilitation was also consulted. PT evaluation on 8/29 however showed that patient was able to ambulate with minimal guard and occasional minimal assist to steady and use of rolling walker. Hence patient was not requiring the intensity of the rehabilitation program for continued functional progress and was declined. Home PT, OT, home RN nurse was arranged by the case manager.  Patient is a 24 y.o. male presenting with shortness of breath.  Shortness of Breath   Past Medical History  Diagnosis Date  . Medical history non-contributory    Past Surgical History  Procedure Laterality Date  . Nasal cauterization     Family History  Problem Relation Age of Onset  . Cancer Mother   . Stroke Maternal Grandmother    Social History  Substance Use Topics  . Smoking status: Current Every Day Smoker  . Smokeless tobacco: None  . Alcohol Use: No    Review of Systems  Respiratory: Positive for shortness of breath.   All other systems reviewed and are negative.     Allergies  Ibuprofen  Home Medications   Prior to Admission medications   Medication Sig Start Date End Date Taking? Authorizing Provider  buPROPion (WELLBUTRIN XL) 150 MG 24 hr tablet Take 1 tablet (150 mg total) by mouth daily. 05/18/15  Yes Ripudeep Jenna Luo, MD  gabapentin (NEURONTIN) 100 MG capsule Take 1 capsule (100 mg total) by mouth 3 (three) times daily. 05/18/15  Yes Ripudeep Jenna Luo, MD  oxyCODONE-acetaminophen (PERCOCET/ROXICET) 5-325 MG per tablet Take 1-2 tablets by mouth every 4 (four) hours as needed for moderate pain or severe pain. 05/18/15  Yes Ripudeep Jenna Luo,  MD  acetaminophen (TYLENOL) 325 MG tablet Take 2 tablets (650 mg total) by mouth every 6 (six) hours as needed for moderate pain or headache. 05/18/15   Ripudeep Jenna Luo, MD  docusate sodium (COLACE) 100 MG capsule Take 1 capsule (100 mg total) by mouth 2 (two) times daily. 05/18/15   Ripudeep Jenna Luo, MD  polyethylene glycol (MIRALAX / GLYCOLAX) packet  Take 17 g by mouth daily as needed for moderate constipation. 05/18/15   Ripudeep K Rai, MD   BP 154/84 mmHg  Pulse 96  Temp(Src) 99.1 F (37.3 C) (Oral)  Ht  (1.854 m)  SpO2 99% Physical Exam  Constitutional: He is oriented to person, place, and time. He appears well-developed and well-nourished. No distress.  HENT:  Head: Normocephalic and atraumatic.  Mouth/Throat: Oropharynx is clear and moist. No oropharyngeal exudate.  Eyes: Conjunctivae and EOM are normal. Pupils are equal, round, and reactive to light. Right eye exhibits no discharge. Left eye exhibits no discharge. No scleral icterus.  Neck: Normal range of motion. Neck supple.  Negative brudzinski's sign  Cardiovascular: Normal rate, regular rhythm, normal heart sounds and intact distal pulses.  Exam reveals no gallop and no friction rub.   No murmur heard. Pulmonary/Chest: Effort normal and breath sounds normal. No respiratory distress. He has no wheezes. He has no rales. He exhibits no tenderness.  Abdominal: Soft. He exhibits no distension. There is no tenderness. There is no rebound and no guarding.  Musculoskeletal: Normal range of motion. He exhibits no edema or tenderness.  Lymphadenopathy:    He has no cervical adenopathy.  Neurological: He is alert and oriented to person, place, and time. He displays normal reflexes. No cranial nerve deficit.  Strength 3/5 in Left upper and lower extremity. Strength 4/5 in right upper and lower extremity. No sensory deficits.   Skin: Skin is warm and dry. No rash noted. He is not diaphoretic. No erythema.  Nursing note and vitals reviewed.   ED Course  Procedures (including critical care time)  D-dimer positive, CT angio chest ordered  Venous duplex study complete: negative for DVT  Spoke with neurology who will consult pt  Per neurology, pt has not changes since discharge from last admission. Stable for discharge. Follow up outpt.    Labs Review Labs Reviewed   BASIC METABOLIC PANEL - Abnormal; Notable for the following:    CO2 21 (*)    All other components within normal limits  D-DIMER, QUANTITATIVE (NOT AT [facility]) - Abnormal; Notable for the following:    D-Dimer, Quant 0.56 (*)    All other components within normal limits  CBC WITH DIFFERENTIAL/PLATELET  TROPONIN I    Imaging Review Dg Chest 2 View  05/20/2015   CLINICAL DATA:  Headaches and chest pressure 2 weeks.  EXAM: CHEST  2 VIEW  COMPARISON:  05/12/2015  FINDINGS: Lungs are hypoinflated and otherwise clear. Cardiomediastinal silhouette and remainder the exam is unchanged.  IMPRESSION: No active cardiopulmonary disease.   Electronically Signed   By: Elberta Fortis M.D.   On: 05/20/2015 20:53   Ct Angio Chest Pe W/cm &/or Wo Cm  05/20/2015   CLINICAL DATA:  Acute onset of leg swelling. Difficulty taking deep breath. Initial encounter.  EXAM: CT ANGIOGRAPHY CHEST WITH CONTRAST  TECHNIQUE: Multidetector CT imaging of the chest was performed using the standard protocol during bolus administration of intravenous contrast. Multiplanar CT image reconstructions and MIPs were obtained to evaluate the vascular anatomy. The study was repeated due to  limitations in the timing of the contrast bolus on the first study.  CONTRAST:  OMNIPAQUE IOHEXOL 350 MG/ML SOLN  COMPARISON:  Chest radiograph performed earlier today at 8:19 p.m., and CTA of the chest performed 06/27/2013  FINDINGS: There is no evidence of significant pulmonary embolus.  The lungs appear essentially clear bilaterally. There is no evidence of significant focal consolidation, pleural effusion or pneumothorax. No masses are identified; no abnormal focal contrast enhancement is seen.  Visualized mediastinal nodes remain normal in size. The mediastinum is unremarkable in appearance. No pericardial effusion is identified. The great vessels are grossly unremarkable in appearance. No axillary lymphadenopathy is seen. The visualized portions of the  thyroid gland are unremarkable in appearance.  The visualized portions of the liver are unremarkable. The spleen is enlarged, measuring 16.8 cm in length. The visualized portions of the gallbladder, pancreas, adrenal glands and kidneys are within normal limits.  No acute osseous abnormalities are seen.  Review of the MIP images confirms the above findings.  IMPRESSION: 1. No evidence of significant pulmonary embolus. 2. Lungs clear bilaterally. 3. Splenomegaly noted.   Electronically Signed   By: Roanna Raider M.D.   On: 05/20/2015 21:42   I have personally reviewed and evaluated these images and lab results as part of my medical decision-making.   Editor: Gara Kroner, RVT (Cardiovascular Sonographer)     Expand All Collapse All   VASCULAR LAB PRELIMINARY PRELIMINARY PRELIMINARY PRELIMINARY  Bilateral lower extremity venous duplex completed.   Preliminary report: Bilateral: No evidence of DVT, superficial thrombosis, or Baker's Cyst.        EKG Interpretation   Date/Time:  Wednesday May 20 2015 16:51:47 EDT Ventricular Rate:  94 PR Interval:  166 QRS Duration: 71 QT Interval:  323 QTC Calculation: 404 R Axis:   64 Text Interpretation:  Sinus rhythm Probable left atrial enlargement  Baseline wander in lead(s) II III aVF Sinus rhythm Artifact T wave  abnormality Abnormal ekg Confirmed by Gerhard Munch  MD ([id number]) on  05/20/2015 5:08:09 PM      MDM   Final diagnoses:  Weakness generalized   Pt seen for generalized weakness and chest pain after recent discharge for GBS. Ddimer positive. CTA ordered, negative for PE.   Pt afebrile, No meningeal signs.  Neuro consulted, do not recommend admission. Recommend follow up out pt.   Bilateral LE venous duplex negative for DVT.   Patient was discussed with and seen by Dr. Jeraldine Loots who agrees with the treatment plan.      Lester Kinsman [place], PA-C 05/20/15 2258  Gerhard Munch, MD 05/20/15 [phone]

## 2015-05-20 NOTE — Consult Note (Signed)
Admission H&P    Chief Complaint: Left lower extremity weakness and pain.  HPI: Donald Mckay. is an 24 y.o. male who was admitted 1 week ago [facility] with numbness involving both lower extremities and right hand. 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 He was treated with IVIG for 5 days and was discharged on 05/18/2015. Patient is complaining of swelling and weakness as well as pain involving his left lower extremity. Patient had no demonstrable weakness involving upper and lower extremities during his hospital stay, and had retained reflexes at knees and ankles. There was subjective absence of sensation to all modalities involving lower extremities. MRI of his lumbar spine as well as thoracic spine showed no acute abnormalities. He came to the emergency room for evaluation of of shortness of breath in addition to swelling and redness of his left lower extremity. CT scan of his chest was unremarkable with no signs of pulmonary embolus. Ultrasound of left lower extremities was performed. Results are pending.  Past medical history: Recent diagnosis and management of acute Guillain-Barr syndrome.  Past Surgical History  Procedure Laterality Date  . Nasal cauterization      Family History  Problem Relation Age of Onset  . Cancer Mother   . Stroke Maternal Grandmother    Social History:  reports that he has been smoking.  He does not have any smokeless tobacco history on file. He reports that he does not drink alcohol or use illicit drugs.  Allergies:  Allergies  Allergen Reactions  . Ibuprofen Other (See Comments)    Nose bleeds    Medications: Patient's outpatient discharge medications were reviewed by me.  ROS: History obtained from the patient  General ROS: negative for - chills, fatigue, fever, night sweats, weight gain or weight loss Psychological ROS: negative for - behavioral disorder, hallucinations, memory  difficulties, mood swings or suicidal ideation Ophthalmic ROS: negative for - blurry vision, double vision, eye pain or loss of vision ENT ROS: negative for - epistaxis, nasal discharge, oral lesions, sore throat, tinnitus or vertigo Allergy and Immunology ROS: negative for - hives or itchy/watery eyes Hematological and Lymphatic ROS: negative for - bleeding problems, bruising or swollen lymph nodes Endocrine ROS: negative for - galactorrhea, hair pattern changes, polydipsia/polyuria or temperature intolerance Respiratory ROS: negative for - cough, hemoptysis, shortness of breath or wheezing Cardiovascular ROS: negative for - chest pain, dyspnea on exertion, edema or irregular heartbeat Gastrointestinal ROS: negative for - abdominal pain, diarrhea, hematemesis, nausea/vomiting or stool incontinence Genito-Urinary ROS: negative for - dysuria, hematuria, incontinence or urinary frequency/urgency Musculoskeletal ROS: As noted in present illness Neurological ROS: as noted in HPI Dermatological ROS: negative for rash and skin lesion changes  Physical Examination: Blood pressure 121/62, pulse 73, temperature 99.1 F (37.3 C), temperature source Oral, resp. rate 13, height 6' 1"  (1.854 m), SpO2 98 %.  HEENT-  Normocephalic, no lesions, without obvious abnormality.  Normal external eye and conjunctiva.  Normal TM's bilaterally.  Normal auditory canals and external ears. Normal external nose, mucus membranes and septum.  Normal pharynx. Neck supple with no masses, nodes, nodules or enlargement. Cardiovascular - regular rate and rhythm, S1, S2 normal, no murmur, click, rub or gallop Lungs - chest clear, no wheezing, rales, normal symmetric air entry Abdomen - soft, non-tender; bowel sounds normal; no masses,  no organomegaly Extremities - no joint deformities, effusion, or inflammation and no edema  Neurologic Examination: Mental Status: Alert, oriented, thought content appropriate.  Speech  fluent  without evidence of aphasia. Able to follow commands without difficulty. Cranial Nerves: II-Visual fields were normal. III/IV/VI-Pupils were equal and reacted normally to light. Extraocular movements were full and conjugate.    V/VII-no facial numbness and no facial weakness. VIII-normal. X-normal speech and symmetrical palatal movement. XI: trapezius strength/neck flexion strength normal bilaterally XII-midline tongue extension with normal strength. Motor: 5/5 bilaterally with normal tone and bulk, including lower extremities proximally and distally. Effort with manual muscle testing was noted to be poor at times. Sensory: Absent perception of all modalities involving lower extremities. Deep Tendon Reflexes: 1+ and symmetric, including knees and ankles. Plantars: Mute bilaterally Cerebellar: Normal finger-to-nose testing.  Results for orders placed or performed during the hospital encounter of 05/20/15 (from the past 48 hour(s))  Basic metabolic panel     Status: Abnormal   Collection Time: 05/20/15  5:25 PM  Result Value Ref Range   Sodium 137 135 - 145 mmol/L   Potassium 4.1 3.5 - 5.1 mmol/L   Chloride 107 101 - 111 mmol/L   CO2 21 (L) 22 - 32 mmol/L   Glucose, Bld 95 65 - 99 mg/dL   BUN 14 6 - 20 mg/dL   Creatinine, Ser 0.85 0.61 - 1.24 mg/dL   Calcium 9.5 8.9 - 10.3 mg/dL   GFR calc non Af Amer >60 >60 mL/min   GFR calc Af Amer >60 >60 mL/min    Comment: (NOTE) The eGFR has been calculated using the CKD EPI equation. This calculation has not been validated in all clinical situations. eGFR's persistently <60 mL/min signify possible Chronic Kidney Disease.    Anion gap 9 5 - 15  CBC with Differential     Status: None   Collection Time: 05/20/15  5:25 PM  Result Value Ref Range   WBC 9.3 4.0 - 10.5 K/uL   RBC 4.79 4.22 - 5.81 MIL/uL   Hemoglobin 14.9 13.0 - 17.0 g/dL   HCT 42.1 39.0 - 52.0 %   MCV 87.9 78.0 - 100.0 fL   MCH 31.1 26.0 - 34.0 pg   MCHC 35.4 30.0 - 36.0  g/dL   RDW 13.3 11.5 - 15.5 %   Platelets 263 150 - 400 K/uL   Neutrophils Relative % 56 43 - 77 %   Neutro Abs 5.2 1.7 - 7.7 K/uL   Lymphocytes Relative 30 12 - 46 %   Lymphs Abs 2.8 0.7 - 4.0 K/uL   Monocytes Relative 9 3 - 12 %   Monocytes Absolute 0.9 0.1 - 1.0 K/uL   Eosinophils Relative 4 0 - 5 %   Eosinophils Absolute 0.4 0.0 - 0.7 K/uL   Basophils Relative 1 0 - 1 %   Basophils Absolute 0.1 0.0 - 0.1 K/uL  Troponin I     Status: None   Collection Time: 05/20/15  5:25 PM  Result Value Ref Range   Troponin I <0.03 <0.031 ng/mL    Comment:        NO INDICATION OF MYOCARDIAL INJURY.   D-dimer, quantitative     Status: Abnormal   Collection Time: 05/20/15  5:25 PM  Result Value Ref Range   D-Dimer, Quant 0.56 (H) 0.00 - 0.48 ug/mL-FEU    Comment:        AT THE INHOUSE ESTABLISHED CUTOFF VALUE OF 0.48 ug/mL FEU, THIS ASSAY HAS BEEN DOCUMENTED IN THE LITERATURE TO HAVE A SENSITIVITY AND NEGATIVE PREDICTIVE VALUE OF AT LEAST 98 TO 99%.  THE TEST RESULT SHOULD BE CORRELATED WITH  AN ASSESSMENT OF THE CLINICAL PROBABILITY OF DVT / VTE.    Dg Chest 2 View  05/20/2015   CLINICAL DATA:  Headaches and chest pressure 2 weeks.  EXAM: CHEST  2 VIEW  COMPARISON:  05/12/2015  FINDINGS: Lungs are hypoinflated and otherwise clear. Cardiomediastinal silhouette and remainder the exam is unchanged.  IMPRESSION: No active cardiopulmonary disease.   Electronically Signed   By: Marin Olp M.D.   On: 05/20/2015 20:53   Ct Angio Chest Pe W/cm &/or Wo Cm  05/20/2015   CLINICAL DATA:  Acute onset of leg swelling. Difficulty taking deep breath. Initial encounter.  EXAM: CT ANGIOGRAPHY CHEST WITH CONTRAST  TECHNIQUE: Multidetector CT imaging of the chest was performed using the standard protocol during bolus administration of intravenous contrast. Multiplanar CT image reconstructions and MIPs were obtained to evaluate the vascular anatomy. The study was repeated due to limitations in the timing of  the contrast bolus on the first study.  CONTRAST:  172m OMNIPAQUE IOHEXOL 350 MG/ML SOLN  COMPARISON:  Chest radiograph performed earlier today at 8:19 p.m., and CTA of the chest performed 06/27/2013  FINDINGS: There is no evidence of significant pulmonary embolus.  The lungs appear essentially clear bilaterally. There is no evidence of significant focal consolidation, pleural effusion or pneumothorax. No masses are identified; no abnormal focal contrast enhancement is seen.  Visualized mediastinal nodes remain normal in size. The mediastinum is unremarkable in appearance. No pericardial effusion is identified. The great vessels are grossly unremarkable in appearance. No axillary lymphadenopathy is seen. The visualized portions of the thyroid gland are unremarkable in appearance.  The visualized portions of the liver are unremarkable. The spleen is enlarged, measuring 16.8 cm in length. The visualized portions of the gallbladder, pancreas, adrenal glands and kidneys are within normal limits.  No acute osseous abnormalities are seen.  Review of the MIP images confirms the above findings.  IMPRESSION: 1. No evidence of significant pulmonary embolus. 2. Lungs clear bilaterally. 3. Splenomegaly noted.   Electronically Signed   By: JGarald BaldingM.D.   On: 05/20/2015 21:42    Assessment/Plan 24year old man with recent hospitalization and treatment for mild Guillain-Barr syndrome presenting with complaint of weakness and heaviness involving her extremity. Clinical examination shows no change and findings from my evaluation one week ago, with no measurable lower extremity weakness and intact lower extremity deep tendon reflexes. Patient has no indications of exacerbation of Guillain-Barr syndrome.  No further neurological intervention is indicated acutely. No contraindications to discharge to home.  C.R. SNicole Kindred [facility] Neuro[facility]ospil[facility]ist [phone] 05/20/2015, 10:38 PM

## 2015-05-20 NOTE — ED Notes (Addendum)
Pt brought to ED by EMS from home with complaints of leg swelling that started yesterday and began have some trouble taking a deep breath. Pt was just sent home from the hospital on Monday after being treated for [facility] syndrome.

## 2015-05-20 NOTE — ED Notes (Signed)
MD at bedside. 

## 2015-05-20 NOTE — Progress Notes (Signed)
VASCULAR LAB PRELIMINARY  PRELIMINARY  PRELIMINARY  PRELIMINARY  Bilateral lower extremity venous duplex  completed.    Preliminary report:  Bilateral:  No evidence of DVT, superficial thrombosis, or Baker's Cyst.    Layton Tappan, RVT 05/20/2015, 7:31 PM

## 2015-05-20 NOTE — ED Notes (Signed)
Provider bedside.

## 2015-05-20 NOTE — Discharge Instructions (Signed)
Fatigue °Fatigue is a feeling of tiredness, lack of energy, lack of motivation, or feeling tired all the time. Having enough rest, good nutrition, and reducing stress will normally reduce fatigue. Consult your caregiver if it persists. The nature of your fatigue will help your caregiver to find out its cause. The treatment is based on the cause.  °CAUSES  °There are many causes for fatigue. Most of the time, fatigue can be traced to one or more of your habits or routines. Most causes fit into one or more of three general areas. They are: °Lifestyle problems °· Sleep disturbances. °· Overwork. °· Physical exertion. °· Unhealthy habits. °· Poor eating habits or eating disorders. °· Alcohol and/or drug use . °· Lack of proper nutrition (malnutrition). °Psychological problems °· Stress and/or anxiety problems. °· Depression. °· Grief. °· Boredom. °Medical Problems or Conditions °· Anemia. °· Pregnancy. °· Thyroid gland problems. °· Recovery from major surgery. °· Continuous pain. °· Emphysema or asthma that is not well controlled °· Allergic conditions. °· Diabetes. °· Infections (such as mononucleosis). °· Obesity. °· Sleep disorders, such as sleep apnea. °· Heart failure or other heart-related problems. °· Cancer. °· Kidney disease. °· Liver disease. °· Effects of certain medicines such as antihistamines, cough and cold remedies, prescription pain medicines, heart and blood pressure medicines, drugs used for treatment of cancer, and some antidepressants. °SYMPTOMS  °The symptoms of fatigue include:  °· Lack of energy. °· Lack of drive (motivation). °· Drowsiness. °· Feeling of indifference to the surroundings. °DIAGNOSIS  °The details of how you feel help guide your caregiver in finding out what is causing the fatigue. You will be asked about your present and past health condition. It is important to review all medicines that you take, including prescription and non-prescription items. A thorough exam will be done.  You will be questioned about your feelings, habits, and normal lifestyle. Your caregiver may suggest blood tests, urine tests, or other tests to look for common medical causes of fatigue.  °TREATMENT  °Fatigue is treated by correcting the underlying cause. For example, if you have continuous pain or depression, treating these causes will improve how you feel. Similarly, adjusting the dose of certain medicines will help in reducing fatigue.  °HOME CARE INSTRUCTIONS  °· Try to get the required amount of good sleep every night. °· Eat a healthy and nutritious diet, and drink enough water throughout the day. °· Practice ways of relaxing (including yoga or meditation). °· Exercise regularly. °· Make plans to change situations that cause stress. Act on those plans so that stresses decrease over time. Keep your work and personal routine reasonable. °· Avoid street drugs and minimize use of alcohol. °· Start taking a daily multivitamin after consulting your caregiver. °SEEK MEDICAL CARE IF:  °· You have persistent tiredness, which cannot be accounted for. °· You have fever. °· You have unintentional weight loss. °· You have headaches. °· You have disturbed sleep throughout the night. °· You are feeling sad. °· You have constipation. °· You have dry skin. °· You have gained weight. °· You are taking any new or different medicines that you suspect are causing fatigue. °· You are unable to sleep at night. °· You develop any unusual swelling of your legs or other parts of your body. °SEEK IMMEDIATE MEDICAL CARE IF:  °· You are feeling confused. °· Your vision is blurred. °· You feel faint or pass out. °· You develop severe headache. °· You develop severe abdominal, pelvic, or   back pain.  You develop chest pain, shortness of breath, or an irregular or fast heartbeat.  You are unable to pass a normal amount of urine.  You develop abnormal bleeding such as bleeding from the rectum or you vomit blood.  You have thoughts  about harming yourself or committing suicide.  You are worried that you might harm someone else. MAKE SURE YOU:   Understand these instructions.  Will watch your condition.  Will get help right away if you are not doing well or get worse. Document Released: 07/03/2007 Document Revised: 11/28/2011 Document Reviewed: 01/07/2014 [facility] Patient Information 2015 [place], [place]. This information is not intended to replace advice given to you by your health care provider. Make sure you discuss any questions you have with your health care provider.  Weakness Weakness is a lack of strength. You may feel weak all over your body or just in one part of your body. Weakness can be serious. In some cases, you may need more medical tests. HOME CARE  Rest.  Eat a well-balanced diet.  Try to exercise every day.  Only take medicines as told by your doctor. GET HELP RIGHT AWAY IF:   You cannot do your normal daily activities.  You cannot walk up and down stairs, or you feel very tired when you do so.  You have shortness of breath or chest pain.  You have trouble moving parts of your body.  You have weakness in only one body part or on only one side of the body.  You have a fever.  You have trouble speaking or swallowing.  You cannot control when you pee (urinate) or poop (bowel movement).  You have black or bloody throw up (vomit) or poop.  Your weakness gets worse or spreads to other body parts.  You have new aches or pains. MAKE SURE YOU:   Understand these instructions.  Will watch your condition.  Will get help right away if you are not doing well or get worse. Document Released: 08/18/2008 Document Revised: 03/06/2012 Document Reviewed: 11/04/2011 [facility] Patient Information 2015 [place], [place]. This information is not intended to replace advice given to you by your health care provider. Make sure you discuss any questions you have with your health care  provider.  Follow up with PCP if symptoms do not improve. Return if symptoms worsen, fevers, chills occur.   Keep follow up appointment with neurology.

## 2015-05-23 ENCOUNTER — Encounter: Payer: Self-pay | Admitting: Family Medicine

## 2015-05-23 ENCOUNTER — Other Ambulatory Visit: Payer: Self-pay | Admitting: Internal Medicine

## 2015-05-24 ENCOUNTER — Emergency Department (HOSPITAL_COMMUNITY): Payer: Medicaid Other

## 2015-05-24 ENCOUNTER — Other Ambulatory Visit: Payer: Self-pay | Admitting: Internal Medicine

## 2015-05-24 ENCOUNTER — Inpatient Hospital Stay (HOSPITAL_COMMUNITY): Payer: Medicaid Other

## 2015-05-24 ENCOUNTER — Inpatient Hospital Stay (HOSPITAL_COMMUNITY)
Admission: EM | Admit: 2015-05-24 | Discharge: 2015-05-30 | DRG: 096 | Disposition: A | Discharge status: Other Acute Inpatient Hospital | Payer: Medicaid Other | Attending: Internal Medicine | Admitting: Internal Medicine

## 2015-05-24 DIAGNOSIS — G44039 Episodic paroxysmal hemicrania, not intractable: Secondary | ICD-10-CM | POA: Diagnosis not present

## 2015-05-24 DIAGNOSIS — R51 Headache: Secondary | ICD-10-CM | POA: Diagnosis not present

## 2015-05-24 DIAGNOSIS — F419 Anxiety disorder, unspecified: Secondary | ICD-10-CM | POA: Diagnosis not present

## 2015-05-24 DIAGNOSIS — F329 Major depressive disorder, single episode, unspecified: Secondary | ICD-10-CM | POA: Diagnosis present

## 2015-05-24 DIAGNOSIS — Z79899 Other long term (current) drug therapy: Secondary | ICD-10-CM

## 2015-05-24 DIAGNOSIS — R29898 Other symptoms and signs involving the musculoskeletal system: Secondary | ICD-10-CM

## 2015-05-24 DIAGNOSIS — E538 Deficiency of other specified B group vitamins: Secondary | ICD-10-CM | POA: Diagnosis not present

## 2015-05-24 DIAGNOSIS — F17210 Nicotine dependence, cigarettes, uncomplicated: Secondary | ICD-10-CM | POA: Diagnosis not present

## 2015-05-24 DIAGNOSIS — F418 Other specified anxiety disorders: Secondary | ICD-10-CM | POA: Diagnosis not present

## 2015-05-24 DIAGNOSIS — R5381 Other malaise: Secondary | ICD-10-CM | POA: Diagnosis not present

## 2015-05-24 DIAGNOSIS — R531 Weakness: Secondary | ICD-10-CM | POA: Diagnosis not present

## 2015-05-24 DIAGNOSIS — G610 Guillain-Barre syndrome: Secondary | ICD-10-CM | POA: Diagnosis not present

## 2015-05-24 HISTORY — DX: Major depressive disorder, single episode, unspecified: F32.9

## 2015-05-24 LAB — CBC WITH DIFFERENTIAL/PLATELET
Basophils Absolute: 0 10*3/uL (ref 0.0–0.1)
Basophils Relative: 1 % (ref 0–1)
EOS ABS: 0.3 10*3/uL (ref 0.0–0.7)
Eosinophils Relative: 3 % (ref 0–5)
HEMATOCRIT: 38.3 % — AB (ref 39.0–52.0)
HEMOGLOBIN: 13.5 g/dL (ref 13.0–17.0)
LYMPHS ABS: 2.2 10*3/uL (ref 0.7–4.0)
Lymphocytes Relative: 27 % (ref 12–46)
MCH: 30.9 pg (ref 26.0–34.0)
MCHC: 35.2 g/dL (ref 30.0–36.0)
MCV: 87.6 fL (ref 78.0–100.0)
MONO ABS: 0.5 10*3/uL (ref 0.1–1.0)
MONOS PCT: 6 % (ref 3–12)
NEUTROS PCT: 63 % (ref 43–77)
Neutro Abs: 5.1 10*3/uL (ref 1.7–7.7)
Platelets: 263 10*3/uL (ref 150–400)
RBC: 4.37 MIL/uL (ref 4.22–5.81)
RDW: 13.1 % (ref 11.5–15.5)
WBC: 8.1 10*3/uL (ref 4.0–10.5)

## 2015-05-24 LAB — I-STAT CHEM 8, ED
BUN: 5 mg/dL — AB (ref 6–20)
CALCIUM ION: 1.14 mmol/L (ref 1.12–1.23)
CREATININE: 0.9 mg/dL (ref 0.61–1.24)
Chloride: 106 mmol/L (ref 101–111)
Glucose, Bld: 85 mg/dL (ref 65–99)
HCT: 38 % — ABNORMAL LOW (ref 39.0–52.0)
Hemoglobin: 12.9 g/dL — ABNORMAL LOW (ref 13.0–17.0)
Potassium: 3.6 mmol/L (ref 3.5–5.1)
SODIUM: 139 mmol/L (ref 135–145)
TCO2: 23 mmol/L (ref 0–100)

## 2015-05-24 LAB — CBC
HCT: 37.2 % — ABNORMAL LOW (ref 39.0–52.0)
Hemoglobin: 12.9 g/dL — ABNORMAL LOW (ref 13.0–17.0)
MCH: 30.4 pg (ref 26.0–34.0)
MCHC: 34.7 g/dL (ref 30.0–36.0)
MCV: 87.5 fL (ref 78.0–100.0)
PLATELETS: 242 10*3/uL (ref 150–400)
RBC: 4.25 MIL/uL (ref 4.22–5.81)
RDW: 13.2 % (ref 11.5–15.5)
WBC: 8.2 10*3/uL (ref 4.0–10.5)

## 2015-05-24 LAB — CREATININE, SERUM
CREATININE: 0.88 mg/dL (ref 0.61–1.24)
GFR calc Af Amer: >60 mL/min (ref >60)
GFR calc non Af Amer: >60 mL/min (ref >60)

## 2015-05-24 LAB — PHOSPHORUS: Phosphorus: 3.2 mg/dL (ref 2.5–4.6)

## 2015-05-24 LAB — MAGNESIUM: MAGNESIUM: 1.6 mg/dL — AB (ref 1.7–2.4)

## 2015-05-24 LAB — MRSA PCR SCREENING: MRSA by PCR: NEGATIVE

## 2015-05-24 MED ORDER — MORPHINE SULFATE (PF) 2 MG/ML IV SOLN
1.0000 mg | INTRAVENOUS | Status: DC | PRN
  Administered 2015-05-25 – 2015-05-26 (×5): 1 mg via INTRAVENOUS
  Filled 2015-05-24 (×5): qty 1

## 2015-05-24 MED ORDER — SODIUM CHLORIDE 0.9 % IV SOLN
INTRAVENOUS | Status: DC
  Administered 2015-05-24 – 2015-05-25 (×3): via INTRAVENOUS
  Administered 2015-05-27: 1000 mL via INTRAVENOUS
  Administered 2015-05-27 – 2015-05-30 (×3): via INTRAVENOUS

## 2015-05-24 MED ORDER — METOCLOPRAMIDE HCL 5 MG/ML IJ SOLN
10.0000 mg | Freq: Once | INTRAMUSCULAR | Status: AC
  Administered 2015-05-24: 10 mg via INTRAVENOUS
  Filled 2015-05-24: qty 2

## 2015-05-24 MED ORDER — SODIUM CHLORIDE 0.9 % IV SOLN
Freq: Once | INTRAVENOUS | Status: AC
  Administered 2015-05-24: 16:00:00 via INTRAVENOUS

## 2015-05-24 MED ORDER — FENTANYL CITRATE (PF) 100 MCG/2ML IJ SOLN
50.0000 ug | Freq: Once | INTRAMUSCULAR | Status: AC
  Administered 2015-05-24: 50 ug via INTRAVENOUS
  Filled 2015-05-24: qty 2

## 2015-05-24 MED ORDER — ONDANSETRON HCL 4 MG/2ML IJ SOLN
4.0000 mg | Freq: Four times a day (QID) | INTRAMUSCULAR | Status: DC | PRN
  Administered 2015-05-28: 4 mg via INTRAVENOUS
  Filled 2015-05-24: qty 2

## 2015-05-24 MED ORDER — MAGNESIUM SULFATE 2 GM/50ML IV SOLN
2.0000 g | Freq: Once | INTRAVENOUS | Status: AC
  Administered 2015-05-24: 2 g via INTRAVENOUS
  Filled 2015-05-24: qty 50

## 2015-05-24 MED ORDER — HYDROCODONE-ACETAMINOPHEN 5-325 MG PO TABS
1.0000 | ORAL_TABLET | ORAL | Status: DC | PRN
  Administered 2015-05-24: 1 via ORAL
  Administered 2015-05-25 – 2015-05-27 (×13): 2 via ORAL
  Filled 2015-05-24 (×6): qty 2
  Filled 2015-05-24: qty 1
  Filled 2015-05-24 (×7): qty 2

## 2015-05-24 MED ORDER — ACETAMINOPHEN 325 MG PO TABS
650.0000 mg | ORAL_TABLET | Freq: Four times a day (QID) | ORAL | Status: DC | PRN
  Filled 2015-05-24: qty 2

## 2015-05-24 MED ORDER — DIPHENHYDRAMINE HCL 50 MG/ML IJ SOLN
12.5000 mg | Freq: Once | INTRAMUSCULAR | Status: AC
  Administered 2015-05-24: 12.5 mg via INTRAVENOUS
  Filled 2015-05-24: qty 1

## 2015-05-24 MED ORDER — GADOBENATE DIMEGLUMINE 529 MG/ML IV SOLN
20.0000 mL | Freq: Once | INTRAVENOUS | Status: AC | PRN
  Administered 2015-05-24: 20 mL via INTRAVENOUS

## 2015-05-24 MED ORDER — ONDANSETRON HCL 4 MG PO TABS: 4.0000 mg | ORAL_TABLET | Freq: Four times a day (QID) | ORAL | Status: DC | PRN

## 2015-05-24 MED ORDER — ENOXAPARIN SODIUM 40 MG/0.4ML ~~LOC~~ SOLN
40.0000 mg | SUBCUTANEOUS | Status: DC
  Administered 2015-05-24 – 2015-05-29 (×6): 40 mg via SUBCUTANEOUS
  Filled 2015-05-24 (×8): qty 0.4

## 2015-05-24 NOTE — ED Notes (Signed)
Pt has given this RN permission to share information with his mother, Everette Rank [phone].  Pt belongings are a cell phone and clothing.

## 2015-05-24 NOTE — Consult Note (Signed)
NEURO HOSPITALIST CONSULT NOTE   Referring physician: [facility] Reason for Consult: worsening BLE weakness, right arm weakness, difficulty swallowing, shortness of breath, numbness legs, bladder impairment.  HPI:                                                                                                                                          Donald Ellerman Veith [name] is an 24 y.o. male without significant past medical history until a recent diagnosis of GBS treated with IVIG x 5 days at [facility] and released on 05/18/15. At the time of that admission he had MRI thoracic and cervical spine that were unremarkable. Review of his medical record indicates that he returned to [facility] on 05/20/15 with complains of swelling and weakness as well as pain involving his left lower extremity and shortness of breath and for that reason was reevaluated by neurology and had an unimpressive neuro-exam at that time, hence patient was discharged home. Donald Mckay went to [facility] today stating that since yesterday afternoon his condition has deteriorated to the degree that he can not walk, can not feel his legs, and the weakness is now affecting his right arm. He tells me that since last night he started noticing trouble breathing but can swallow without problem. Further, said that now he can not control his bladder and hasn't had a bowel movement since discharge from [facility] on 8/29. Additionally, complains of daily HA but denies vertigo, double vision, slurred speech, language or vision impairment. Available serologies were reviewed and are unremarkable.  Past Medical History  Diagnosis Date  . Medical history non-contributory     Past Surgical History  Procedure Laterality Date  . Nasal cauterization      Family History  Problem Relation Age of Onset  . Cancer Mother   . Stroke Maternal Grandmother     Family History: no MS, epilepsy, brain tumor, or brain aneurysm  Social History:  reports  that he has been smoking.  He does not have any smokeless tobacco history on file. He reports that he does not drink alcohol or use illicit drugs.  Allergies  Allergen Reactions  . Ibuprofen Other (See Comments)    Nose bleeds    MEDICATIONS:  Scheduled: . enoxaparin (LOVENOX) injection  40 mg Subcutaneous Q24H     ROS:                                                                                                                                       History obtained from chart review and the patient  General ROS: negative for - chills, fatigue, fever, night sweats, weight gain or weight loss Psychological ROS: negative for - behavioral disorder, hallucinations, memory difficulties, mood swings or suicidal ideation Ophthalmic ROS: negative for - blurry vision, double vision, eye pain or loss of vision ENT ROS: negative for - epistaxis, nasal discharge, oral lesions, sore throat, tinnitus or vertigo Allergy and Immunology ROS: negative for - hives or itchy/watery eyes Hematological and Lymphatic ROS: negative for - bleeding problems, bruising or swollen lymph nodes Endocrine ROS: negative for - galactorrhea, hair pattern changes, polydipsia/polyuria or temperature intolerance Respiratory ROS: negative for - cough, hemoptysis, or wheezing Cardiovascular ROS: negative for - chest pain, dyspnea on exertion, edema or irregular heartbeat Gastrointestinal ROS: negative for - abdominal pain, diarrhea, hematemesis, nausea/vomiting or stool incontinence Genito-Urinary ROS: negative for - dysuria or hematuria Musculoskeletal ROS: negative for - joint swelling  Neurological ROS: as noted in HPI Dermatological ROS: negative for rash and skin lesion changes   Physical exam:  Constitutional: well developed, pleasant male in no apparent distress. Blood pressure 156/71, pulse 76,  temperature 97.9 F (36.6 C), temperature source Oral, resp. rate 24, height  (1.854 m), weight 136.5 kg (300 lb 14.9 oz), SpO2 100 %. Head: normocephalic. Neck: supple, no bruits, no JVD. Cardiac: no murmurs. Lungs: clear. Abdomen: soft, no tender, no mass. Extremities: no edema, clubbing, or cyanosis.  Skin: no rash  Neurologic Examination:                                                                                                      General: Mental Status: Alert, oriented, thought content appropriate.  Speech fluent without evidence of aphasia.  Able to follow 3 step commands without difficulty. Cranial Nerves: II: Discs flat bilaterally; Visual fields grossly normal, pupils equal, round, reactive to light and accommodation III,IV, VI: ptosis not present, extra-ocular motions intact bilaterally V,VII: smile symmetric, facial light touch sensation normal bilaterally VIII: hearing normal bilaterally IX,X: uvula rises symmetrically XI: bilateral shoulder shrug XII: midline tongue extension without atrophy or fasciculations Motor: There is frank lack of effort and giveaway weakness on testing of the upper and lower extremities  Tone and bulk:normal tone throughout; no atrophy noted Sensory: Pinprick and light touch subjectively impaired from the waist down and upper extremities. Deep Tendon Reflexes:  Preserved 2+ biceps and knee jerks bilaterally Plantars: Right: downgoing   Left: downgoing Cerebellar: normal finger-to-nose,  Weakness lower extremities precludes heel-to-shin testing Gait:  No tested, as patient endorses that he can not stand and walk.    No results found for: CHOL  Results for orders placed or performed during the hospital encounter of 05/24/15 (from the past 48 hour(s))  CBC with Differential     Status: Abnormal   Collection Time: 05/24/15  3:44 PM  Result Value Ref Range   WBC 8.1 4.0 - 10.5 K/uL   RBC 4.37 4.22 - 5.81 MIL/uL   Hemoglobin  13.5 13.0 - 17.0 g/dL   HCT 96.2 (L) 95.2 - 84.1 %   MCV 87.6 78.0 - 100.0 fL   MCH 30.9 26.0 - 34.0 pg   MCHC 35.2 30.0 - 36.0 g/dL   RDW 32.4 40.1 - 02.7 %   Platelets 263 150 - 400 K/uL   Neutrophils Relative % 63 43 - 77 %   Neutro Abs 5.1 1.7 - 7.7 K/uL   Lymphocytes Relative 27 12 - 46 %   Lymphs Abs 2.2 0.7 - 4.0 K/uL   Monocytes Relative 6 3 - 12 %   Monocytes Absolute 0.5 0.1 - 1.0 K/uL   Eosinophils Relative 3 0 - 5 %   Eosinophils Absolute 0.3 0.0 - 0.7 K/uL   Basophils Relative 1 0 - 1 %   Basophils Absolute 0.0 0.0 - 0.1 K/uL  I-stat chem 8, ed     Status: Abnormal   Collection Time: 05/24/15  3:53 PM  Result Value Ref Range   Sodium 139 135 - 145 mmol/L   Potassium 3.6 3.5 - 5.1 mmol/L   Chloride 106 101 - 111 mmol/L   BUN 5 (L) 6 - 20 mg/dL   Creatinine, Ser 2.53 0.61 - 1.24 mg/dL   Glucose, Bld 85 65 - 99 mg/dL   Calcium, Ion 6.64 4.03 - 1.23 mmol/L   TCO2 23 0 - 100 mmol/L   Hemoglobin 12.9 (L) 13.0 - 17.0 g/dL   HCT 47.4 (L) 25.9 - 56.3 %    Dg Chest 2 View  05/24/2015   CLINICAL DATA:  Headache, weakness, shortness of breath.  EXAM: CHEST  2 VIEW  COMPARISON:  05/20/2015 radiographs and CTA.  FINDINGS: Normal sized heart. Clear lungs. Lower thoracic spine degenerative changes.  IMPRESSION: No acute abnormality.   Electronically Signed   By: Beckie Salts M.D.   On: 05/24/2015 17:14   Assessment/Plan: 24 y/o male recently admitted to [facility] with diagnosis of GBS, transferred to [facility] due to worsening of the previously described symptomatology. This is his second evaluation by neurology since his discharge from [facility] on 05/18/15 with diagnosis of GBS which was treated with IVIG x 5 days. The most salient finding of his neuro evaluation today is that he has preserved DTR's and  completely functional motor weakness of lower and upper extremities. Therefore, I am not particularly impressed with his current presentation and don't feel compelled to give him another trial of  IVIG of PLEX. Whether or not repeat MRI cervico-thoracic spine is warranted is very debatable, but will give him the benefit of the doubt and re image these regions. Will follow up.   Wyatt Portela, MD 05/24/2015, 6:04 PM  [facility]

## 2015-05-24 NOTE — H&P (Addendum)
[facility] History and Physical  Donald Mckay. JXB:[id number] DOB: 08/21/1991 DOA: 05/24/2015  Referring physician: ED PA, Sharen Hones PCP: No PCP Per Patient   Chief Complaint: headaches and weakness   HPI:  24 y.o. male with recent diagnosis of GBS treated with IVIG x 5 days at [facility] and was discharged on 05/18/15, he now presented back to [facility] ED with main concern of persistent weakness in LE and was unable to ambulate in the past 24 hours and was associated with numbness and loss of sensation in the lower extremities. He also explains he thinks his upper extremities are now getting involved as well but he is able to move them. He explains he has noted some dyspnea with exertion and at rest, and difficulty with swallowing. He denies chest pain, abd or urinary problems.   In ED, pt noted to have RR up to 24 and stable BP. TR[facility] asked to admit for further evaluation, neurology consulted and asked to transfer pt to [facility] SDU.  Assessment and Plan: Active Problems:   Guillain Barr syndrome - admit to [facility] SDU - follow up on neurology team recommendations - provide analgesia as needed - keep on oxygen as needed    Dyspnea - normal oxygen saturation and clear CXR - monitor in SDU  - check UDS  DVT prophylaxis - Lovenox SQ  Radiological Exams on Admission: No results found.   Code Status: Full Family Communication: Pt at bedside Disposition Plan: Admit for further evaluation    Danie Binder [facility] [phone]   Review of Systems:  Constitutional: Negative for fever, chills. Negative for diaphoresis.  HENT: Negative for hearing loss, ear pain, nosebleeds, congestion, sore throat, neck pain, tinnitus and ear discharge.   Eyes: Negative for blurred vision, double vision, photophobia, pain, discharge and redness.  Respiratory: Negative for cough, hemoptysis, sputum production, wheezing and stridor.   Cardiovascular: Negative for chest pain, palpitations, orthopnea,  claudication and leg swelling.  Gastrointestinal: Negative for nausea, vomiting and abdominal pain.  Genitourinary: Negative for dysuria, urgency, frequency, hematuria and flank pain.  Musculoskeletal: Negative for myalgias, back pain, joint pain and falls.  Skin: Negative for itching and rash.  Neurological: per HPI Endo/Heme/Allergies: Negative for environmental allergies and polydipsia. Does not bruise/bleed easily.  Psychiatric/Behavioral: Negative for suicidal ideas. The patient is not nervous/anxious.      Past Medical History  Diagnosis Date  . Medical history non-contributory     Past Surgical History  Procedure Laterality Date  . Nasal cauterization      Social History:  reports that he has been smoking.  He does not have any smokeless tobacco history on file. He reports that he does not drink alcohol or use illicit drugs.  Allergies  Allergen Reactions  . Ibuprofen Other (See Comments)    Nose bleeds    Family History  Problem Relation Age of Onset  . Cancer Mother   . Stroke Maternal Grandmother     Prior to Admission medications   Medication Sig Start Date End Date Taking? Authorizing Provider  buPROPion (WELLBUTRIN XL) 150 MG 24 hr tablet Take 1 tablet (150 mg total) by mouth daily. 05/18/15  Yes Ripudeep Jenna Luo, MD  gabapentin (NEURONTIN) 100 MG capsule Take 1 capsule (100 mg total) by mouth 3 (three) times daily. 05/18/15  Yes Ripudeep Jenna Luo, MD  acetaminophen (TYLENOL) 325 MG tablet Take 2 tablets (650 mg total) by mouth every 6 (six) hours as needed for moderate pain or headache. Patient not  taking: Reported on 05/24/2015 05/18/15   Ripudeep Jenna Luo, MD  docusate sodium (COLACE) 100 MG capsule Take 1 capsule (100 mg total) by mouth 2 (two) times daily. Patient not taking: Reported on 05/24/2015 05/18/15   Ripudeep Jenna Luo, MD  oxyCODONE-acetaminophen (PERCOCET/ROXICET) 5-325 MG per tablet Take 1-2 tablets by mouth every 4 (four) hours as needed for moderate pain or  severe pain. Patient not taking: Reported on 05/24/2015 05/18/15   Ripudeep Jenna Luo, MD  polyethylene glycol (MIRALAX / GLYCOLAX) packet Take 17 g by mouth daily as needed for moderate constipation. Patient not taking: Reported on 05/24/2015 05/18/15   Ripudeep Jenna Luo, MD    Physical Exam: Filed Vitals:   05/24/15 1517 05/24/15 1532 05/24/15 1615  BP: 145/89  125/67  Pulse: 96  82  Temp: 98.3 F (36.8 C)    TempSrc: Oral    Resp: 16  16  Height:  6\' 1"  (1.854 m)   SpO2: 96%  97%    Physical Exam  Constitutional: Appears well-developed and well-nourished. No distress.  HENT: Normocephalic. External right and left ear normal. Oropharynx is clear and moist.  Eyes: Conjunctivae and EOM are normal. PERRLA, no scleral icterus.  Neck: Normal ROM. Neck supple. No JVD. No tracheal deviation. No thyromegaly.  CVS: RRR, S1/S2 +, no murmurs, no gallops, no carotid bruit.  Pulmonary: Effort and breath sounds normal, no stridor, rhonchi, wheezes, rales.  Abdominal: Soft. BS +,  no distension, tenderness, rebound or guarding.  Musculoskeletal: Normal range of motion. No edema and no tenderness.  Lymphadenopathy: No lymphadenopathy noted, cervical, inguinal. Neuro: Alert. Normal reflexes, muscle tone coordination. No cranial nerve deficit. Skin: Skin is warm and dry. No rash noted. Not diaphoretic. No erythema. No pallor.  Psychiatric: Normal mood and affect. Behavior, judgment, thought content normal.   Labs on Admission:  Basic Metabolic Panel:  Recent Labs Lab 05/18/15 0434 05/20/15 1725 05/24/15 1553  NA 136 137 139  K 4.8 4.1 3.6  CL 101 107 106  CO2 30 21*  --   GLUCOSE 86 95 85  BUN 14 14 5*  CREATININE 0.97 0.85 0.90  CALCIUM 9.1 9.5  --    CBC:  Recent Labs Lab 05/20/15 1725 05/24/15 1544 05/24/15 1553  WBC 9.3 8.1  --   NEUTROABS 5.2 5.1  --   HGB 14.9 13.5 12.9*  HCT 42.1 38.3* 38.0*  MCV 87.9 87.6  --   PLT 263 263  --    Cardiac Enzymes:  Recent Labs Lab  05/20/15 1725  TROPONINI <0.03    EKG: pending    If 7PM-7AM, please contact night-coverage [email or web address] Password [facility] 05/24/2015, 4:47 PM

## 2015-05-24 NOTE — ED Notes (Signed)
EMS brought in patient for eval of headache. Began to feel presyncopal just as arriving at hospital. Dizziness.  Weakness that has been ongoing.   140/60 HR 110

## 2015-05-24 NOTE — ED Notes (Signed)
Pt states that LLE weakness has been present for the last several months.  He is ambulatory, but must "drag that leg along".

## 2015-05-24 NOTE — ED Provider Notes (Signed)
CSN: [id number]     Arrival date & time 05/24/15  1510 History   First MD Initiated Contact with Patient 05/24/15 1516     Chief Complaint  Patient presents with  . Headache     (Consider location/radiation/quality/duration/timing/severity/associated sxs/prior Treatment) Patient is a 24 y.o. male presenting with headaches and extremity weakness. The history is provided by the patient.  Headache Pain location:  Generalized Quality:  Dull Severity currently:  4/10 Severity at highest:  8/10 Onset quality:  Gradual Timing:  Constant Progression:  Worsening Chronicity:  New Similar to prior headaches: yes   Worsened by:  Nothing Ineffective treatments:  None tried Associated symptoms: nausea, numbness, photophobia, vomiting and weakness   Associated symptoms: no diarrhea   Extremity Weakness This is a recurrent problem. The current episode started 1 to 4 weeks ago. The problem occurs constantly. The problem has been gradually worsening. Associated symptoms include chest pain, headaches, nausea, numbness, urinary symptoms, vomiting and weakness. Nothing aggravates the symptoms. He has tried nothing for the symptoms. The treatment provided no relief.    Past Medical History  Diagnosis Date  . Medical history non-contributory    Past Surgical History  Procedure Laterality Date  . Nasal cauterization     Family History  Problem Relation Age of Onset  . Cancer Mother   . Stroke Maternal Grandmother    Social History  Substance Use Topics  . Smoking status: Current Every Day Smoker  . Smokeless tobacco: Not on file  . Alcohol Use: No    Review of Systems  HENT: Positive for trouble swallowing.   Eyes: Positive for photophobia.  Respiratory: Positive for shortness of breath.   Cardiovascular: Positive for chest pain.  Gastrointestinal: Positive for nausea and vomiting. Negative for diarrhea and constipation.  Genitourinary:       Is been incontinent 2  Musculoskeletal:  Positive for extremity weakness.  Neurological: Positive for weakness, numbness and headaches.  All other systems reviewed and are negative.     Allergies  Ibuprofen  Home Medications   Prior to Admission medications   Medication Sig Start Date End Date Taking? Authorizing Provider  buPROPion (WELLBUTRIN XL) 150 MG 24 hr tablet Take 1 tablet (150 mg total) by mouth daily. 05/18/15  Yes Ripudeep Jenna Luo, MD  gabapentin (NEURONTIN) 100 MG capsule Take 1 capsule (100 mg total) by mouth 3 (three) times daily. 05/18/15  Yes Ripudeep Jenna Luo, MD  acetaminophen (TYLENOL) 325 MG tablet Take 2 tablets (650 mg total) by mouth every 6 (six) hours as needed for moderate pain or headache. Patient not taking: Reported on 05/24/2015 05/18/15   Ripudeep Jenna Luo, MD  docusate sodium (COLACE) 100 MG capsule Take 1 capsule (100 mg total) by mouth 2 (two) times daily. Patient not taking: Reported on 05/24/2015 05/18/15   Ripudeep Jenna Luo, MD  oxyCODONE-acetaminophen (PERCOCET/ROXICET) 5-325 MG per tablet Take 1-2 tablets by mouth every 4 (four) hours as needed for moderate pain or severe pain. Patient not taking: Reported on 05/24/2015 05/18/15   Ripudeep Jenna Luo, MD  polyethylene glycol (MIRALAX / GLYCOLAX) packet Take 17 g by mouth daily as needed for moderate constipation. Patient not taking: Reported on 05/24/2015 05/18/15   Ripudeep K Rai, MD   BP 125/67 mmHg  Pulse 82  Temp(Src) 98.3 F (36.8 C) (Oral)  Resp 16  Ht 6\' 1"  (1.854 m)  SpO2 97% Physical Exam  Constitutional: He appears well-developed. He appears ill.  HENT:  Head: Normocephalic.  Eyes: Pupils  are equal, round, and reactive to light.  Neck: Normal range of motion.  Cardiovascular: Normal rate.   Pulmonary/Chest: Effort normal. No respiratory distress. He has no wheezes.  Abdominal: Soft. He exhibits no distension. There is no tenderness.  Musculoskeletal: He exhibits no edema or tenderness.  Neurological: He is alert. He displays abnormal reflex. A  sensory deficit is present. No cranial nerve deficit. He exhibits abnormal muscle tone. He displays no seizure activity. GCS eye subscore is 4. GCS verbal subscore is 5. GCS motor subscore is 6.  Reflex Scores:      Brachioradialis reflexes are 3+ on the right side and 3+ on the left side.      Patellar reflexes are 0 on the right side and 0 on the left side.      Achilles reflexes are 0 on the right side and 0 on the left side. Skin: Skin is warm and dry. There is pallor.  Vitals reviewed.   ED Course  Procedures (including critical care time) Labs Review Labs Reviewed  CBC WITH DIFFERENTIAL/PLATELET - Abnormal; Notable for the following:    HCT 38.3 (*)    All other components within normal limits  I-STAT CHEM 8, ED - Abnormal; Notable for the following:    BUN 5 (*)    Hemoglobin 12.9 (*)    HCT 38.0 (*)    All other components within normal limits  URINALYSIS, ROUTINE W REFLEX MICROSCOPIC (NOT AT [facility])    Imaging Review No results found. I have personally reviewed and evaluated these images and lab results as part of my medical decision-making.   EKG Interpretation None     Patient appears to have exacerbation of Guillain-Barr syndrome with increased weakness extending now to waist.  He is having a hard time sitting up unsupported.  He has been incontinent of urine 2.  He has decreased sensation in the Perineal area.  Is also complaining of shortness of breath early saity,  He also reports  difficulty swallowing solids but is able to follow swallow liquids without difficulty I spoke with Dr. Cyril Mourning the hospital, neurologist, who agrees the patient needs to be readmitted most likely for a second course of IVIG.  He would like the patient transferred to come to the hospitalist service./ I spoke with Dr. Izola Price hospitalist at [facility].  He'll speak with her partner at [facility] has made arrangements to have the patient transferred to stepdown unit for more intensive monitoring MDM    Final diagnoses:  Guillain Barr syndrome        Earley Favor, NP 05/24/15 1620  Earley Favor, NP 05/24/15 9604  Bethann Berkshire, MD 05/24/15 2228

## 2015-05-24 NOTE — ED Notes (Signed)
Bed: ZO10 Expected date:  Expected time:  Means of arrival:  Comments: EMS- dizziness

## 2015-05-25 ENCOUNTER — Encounter (HOSPITAL_COMMUNITY): Payer: Self-pay

## 2015-05-25 DIAGNOSIS — R29898 Other symptoms and signs involving the musculoskeletal system: Secondary | ICD-10-CM

## 2015-05-25 LAB — RAPID URINE DRUG SCREEN, HOSP PERFORMED
AMPHETAMINES: NOT DETECTED
BARBITURATES: NOT DETECTED
BENZODIAZEPINES: NOT DETECTED
Cocaine: NOT DETECTED
Opiates: POSITIVE — AB
TETRAHYDROCANNABINOL: POSITIVE — AB

## 2015-05-25 LAB — VITAMIN B12: Vitamin B-12: 248 pg/mL (ref 180–914)

## 2015-05-25 LAB — BASIC METABOLIC PANEL
Anion gap: 7 (ref 5–15)
BUN: <5 mg/dL — ABNORMAL LOW (ref 6–20)
CHLORIDE: 104 mmol/L (ref 101–111)
CO2: 25 mmol/L (ref 22–32)
Calcium: 8.8 mg/dL — ABNORMAL LOW (ref 8.9–10.3)
Creatinine, Ser: 0.82 mg/dL (ref 0.61–1.24)
GFR calc non Af Amer: >60 mL/min (ref >60)
Glucose, Bld: 82 mg/dL (ref 65–99)
POTASSIUM: 3.8 mmol/L (ref 3.5–5.1)
SODIUM: 136 mmol/L (ref 135–145)

## 2015-05-25 LAB — CBC
HEMATOCRIT: 36.9 % — AB (ref 39.0–52.0)
Hemoglobin: 12.7 g/dL — ABNORMAL LOW (ref 13.0–17.0)
MCH: 30.2 pg (ref 26.0–34.0)
MCHC: 34.4 g/dL (ref 30.0–36.0)
MCV: 87.6 fL (ref 78.0–100.0)
Platelets: 261 10*3/uL (ref 150–400)
RBC: 4.21 MIL/uL — AB (ref 4.22–5.81)
RDW: 13.4 % (ref 11.5–15.5)
WBC: 6.7 10*3/uL (ref 4.0–10.5)

## 2015-05-25 LAB — CK: Total CK: 45 U/L — ABNORMAL LOW (ref 49–397)

## 2015-05-25 LAB — TSH: TSH: 4.871 u[IU]/mL — ABNORMAL HIGH (ref 0.350–4.500)

## 2015-05-25 NOTE — Progress Notes (Signed)
Utilization Review Completed.Donald Mckay T9/01/2015  

## 2015-05-25 NOTE — Progress Notes (Signed)
Subjective: No overnight events. Continued weakness in LE>UE  MRI C and T spine imaging reviewed and overall unremarkable.   Objective: Current vital signs: BP 128/68 mmHg  Pulse 75  Temp(Src) 98.1 F (36.7 C) (Oral)  Resp 21  Ht 6\' 1"  (1.854 m)  Wt 136.5 kg (300 lb 14.9 oz)  BMI 39.71 kg/m2  SpO2 100% Vital signs in last 24 hours: Temp:  [97.9 F (36.6 C)-98.3 F (36.8 C)] 98.1 F (36.7 C) (09/05 0507) Pulse Rate:  [75-96] 75 (09/05 0507) Resp:  [16-24] 21 (09/05 0507) BP: (125-156)/(67-89) 128/68 mmHg (09/05 0507) SpO2:  [96 %-100 %] 100 % (09/05 0507) Weight:  [136.5 kg (300 lb 14.9 oz)] 136.5 kg (300 lb 14.9 oz) (09/04 1742)  Intake/Output from previous day: 09/04 0701 - 09/05 0700 In: 1640 [P.O.:590; I.V.:1050] Out: 300 [Urine:300] Intake/Output this shift: Total I/O In: -  Out: 550 [Urine:550] Nutritional status: Diet regular Room service appropriate?: Yes; Fluid consistency:: Thin  Neurologic Exam: Mental Status: Alert, oriented, thought content appropriate. Speech fluent without evidence of aphasia.  Cranial Nerves: II: Visual fields grossly normal, pupils equal, round, reactive to light  III,IV, VI: ptosis not present, extra-ocular motions intact bilaterally V,VII: smile symmetric, facial light touch sensation normal bilaterally Motor: There is frank lack of effort and giveaway weakness on testing of the upper and lower extremities. With distraction his strength appears intact Tone and bulk:normal tone throughout; no atrophy noted Sensory: Pinprick and light touch subjectively impaired from the waist down and upper extremities. Deep Tendon Reflexes:  Preserved 2+ biceps and knee jerks bilaterally   Lab Results: Basic Metabolic Panel:  Recent Labs Lab 05/20/15 1725 05/24/15 1553 05/24/15 1905 05/25/15 0243  NA 137 139  --  136  K 4.1 3.6  --  3.8  CL 107 106  --  104  CO2 21*  --   --  25  GLUCOSE 95 85  --  82  BUN 14 5*  --  <5*   CREATININE 0.85 0.90 0.88 0.82  CALCIUM 9.5  --   --  8.8*  MG  --   --  1.6*  --   PHOS  --   --  3.2  --     Liver Function Tests: No results for input(s): AST, ALT, ALKPHOS, BILITOT, PROT, ALBUMIN in the last 168 hours. No results for input(s): LIPASE, AMYLASE in the last 168 hours. No results for input(s): AMMONIA in the last 168 hours.  CBC:  Recent Labs Lab 05/20/15 1725 05/24/15 1544 05/24/15 1553 05/24/15 1905 05/25/15 0243  WBC 9.3 8.1  --  8.2 6.7  NEUTROABS 5.2 5.1  --   --   --   HGB 14.9 13.5 12.9* 12.9* 12.7*  HCT 42.1 38.3* 38.0* 37.2* 36.9*  MCV 87.9 87.6  --  87.5 87.6  PLT 263 263  --  242 261    Cardiac Enzymes:  Recent Labs Lab 05/20/15 1725  TROPONINI <0.03    Lipid Panel: No results for input(s): CHOL, TRIG, HDL, CHOLHDL, VLDL, LDLCALC in the last 168 hours.  CBG: No results for input(s): GLUCAP in the last 168 hours.  Microbiology: Results for orders placed or performed during the hospital encounter of 05/24/15  MRSA PCR Screening     Status: None   Collection Time: 05/24/15  7:02 PM  Result Value Ref Range Status   MRSA by PCR NEGATIVE NEGATIVE Final    Comment:        The GeneXpert MRSA  Assay (FDA approved for NASAL specimens only), is one component of a comprehensive MRSA colonization surveillance program. It is not intended to diagnose MRSA infection nor to guide or monitor treatment for MRSA infections.     Coagulation Studies: No results for input(s): LABPROT, INR in the last 72 hours.  Imaging: Dg Chest 2 View  05/24/2015   CLINICAL DATA:  Headache, weakness, shortness of breath.  EXAM: CHEST  2 VIEW  COMPARISON:  05/20/2015 radiographs and CTA.  FINDINGS: Normal sized heart. Clear lungs. Lower thoracic spine degenerative changes.  IMPRESSION: No acute abnormality.   Electronically Signed   By: Beckie Salts M.D.   On: 05/24/2015 17:14   Mr Cervical Spine W Wo Contrast  05/24/2015   CLINICAL DATA:  Recent diagnosis of  Guillain-Barre syndrome, treated with IV IG for 5 days, discharged May 18, 2015. Shortness of breath and leg swelling, now with lower extremity paresthesias, bowel incontinence.  EXAM: MRI CERVICAL AND THORACIC SPINE WITHOUT AND WITH CONTRAST  TECHNIQUE: Multiplanar and multiecho pulse sequences of the cervical spine, to include the craniocervical junction and cervicothoracic junction, and thoracic spine, were obtained without and with intravenous contrast.  CONTRAST:  20mL MULTIHANCE GADOBENATE DIMEGLUMINE 529 MG/ML IV SOLN  COMPARISON:  MRI of the cervical and thoracic spine May 13, 2015.  FINDINGS: Large body habitus results in overall decreased signal to noise ratio.  MRI CERVICAL SPINE FINDINGS  Multiple sequences are motion degraded, moderate to severely motion degraded sagittal T2 and STIR sequence.  Cervical vertebral bodies and posterior elements are intact and aligned with straightened cervical lordosis. Intervertebral discs demonstrate normal morphology and signal characteristics. No abnormal osseous or intradiscal signal nor enhancement.  Normal appearance of the cervical spinal cord given the degree of motion. No abnormal cord, leptomeningeal or epidural enhancement. Included prevertebral and paraspinal soft tissues are normal.  Due to motion, limited level by level evaluation. At C4-5 there is a small central disc protrusion, effacing the ventral thecal sac. Possible mild neural foraminal narrowing.  MRI THORACIC SPINE FINDINGS  Multiple sequences are motion degraded; moderately motion degraded STIR sequence. Thoracic vertebral bodies appear intact and aligned with maintenance of thoracic kyphosis. Intervertebral discs demonstrate normal morphology and signal characteristics. No abnormal osseous signal. No abnormal osseous or intradiscal enhancement.  Limited assessment of thoracic spinal cord due to motion, without definite signal abnormality or syrinx. No abnormal cord, leptomeningeal or  epidural enhancement. Included prevertebral and paraspinal soft tissues are normal.  No significant disc bulge, canal stenosis or neural foraminal narrowing giving motion degraded examination.  IMPRESSION: Similarly motion and habitus degraded MRI of the cervical and thoracic spine without acute process, specifically no abnormal enhancement. Limited assessment of cord signal abnormality.  No acute fracture, malalignment or significant neurocompressive changes. Possible mild C4-5 neural foraminal narrowing.   Electronically Signed   By: Awilda Metro M.D.   On: 05/24/2015 22:23   Mr Thoracic Spine W Wo Contrast  05/24/2015   CLINICAL DATA:  Recent diagnosis of Guillain-Barre syndrome, treated with IV IG for 5 days, discharged May 18, 2015. Shortness of breath and leg swelling, now with lower extremity paresthesias, bowel incontinence.  EXAM: MRI CERVICAL AND THORACIC SPINE WITHOUT AND WITH CONTRAST  TECHNIQUE: Multiplanar and multiecho pulse sequences of the cervical spine, to include the craniocervical junction and cervicothoracic junction, and thoracic spine, were obtained without and with intravenous contrast.  CONTRAST:  20mL MULTIHANCE GADOBENATE DIMEGLUMINE 529 MG/ML IV SOLN  COMPARISON:  MRI of the cervical and thoracic  spine May 13, 2015.  FINDINGS: Large body habitus results in overall decreased signal to noise ratio.  MRI CERVICAL SPINE FINDINGS  Multiple sequences are motion degraded, moderate to severely motion degraded sagittal T2 and STIR sequence.  Cervical vertebral bodies and posterior elements are intact and aligned with straightened cervical lordosis. Intervertebral discs demonstrate normal morphology and signal characteristics. No abnormal osseous or intradiscal signal nor enhancement.  Normal appearance of the cervical spinal cord given the degree of motion. No abnormal cord, leptomeningeal or epidural enhancement. Included prevertebral and paraspinal soft tissues are normal.  Due to  motion, limited level by level evaluation. At C4-5 there is a small central disc protrusion, effacing the ventral thecal sac. Possible mild neural foraminal narrowing.  MRI THORACIC SPINE FINDINGS  Multiple sequences are motion degraded; moderately motion degraded STIR sequence. Thoracic vertebral bodies appear intact and aligned with maintenance of thoracic kyphosis. Intervertebral discs demonstrate normal morphology and signal characteristics. No abnormal osseous signal. No abnormal osseous or intradiscal enhancement.  Limited assessment of thoracic spinal cord due to motion, without definite signal abnormality or syrinx. No abnormal cord, leptomeningeal or epidural enhancement. Included prevertebral and paraspinal soft tissues are normal.  No significant disc bulge, canal stenosis or neural foraminal narrowing giving motion degraded examination.  IMPRESSION: Similarly motion and habitus degraded MRI of the cervical and thoracic spine without acute process, specifically no abnormal enhancement. Limited assessment of cord signal abnormality.  No acute fracture, malalignment or significant neurocompressive changes. Possible mild C4-5 neural foraminal narrowing.   Electronically Signed   By: Awilda Metro M.D.   On: 05/24/2015 22:23    Medications:  Scheduled: . enoxaparin (LOVENOX) injection  40 mg Subcutaneous Q24H    Assessment/Plan: 24 y/o male recently admitted to [facility] with diagnosis of GBS, transferred to [facility] due to worsening of the previously described symptomatology. This is his second evaluation by neurology since his discharge from [facility] on 05/18/15 with diagnosis of GBS which was treated with IVIG x 5 days. The most salient finding of his neuro evaluation today is that he has preserved DTR's and apparent functional motor weakness of lower and upper extremities. MRI C and T spine imaging unremarkable. Would hold on L spine imaging as he has UE symptoms too.   Overall his presentation is not  consistent with a worsening GBS as his weakness appears effort dependent and his reflexes remain intact. Suspect there is a strong functional overlay to his presentation. He would benefit from an EMG/NCS which can only be done as outpatient.   -check ANA, B12, TSH, HIV -respiratory to check NiF and VC -would benefit from EMG/NCS as outpatient -due to low suspicion of GBS would hold on repeat IVIG or PLEX at this time.  -Will continue to monitor    LOS: 1 day   Elspeth Cho, DO [facility] [phone]  If 7pm- 7am, please page neurology on call as listed in AMION. 05/25/2015  7:56 AM

## 2015-05-25 NOTE — Progress Notes (Signed)
[facility] PROGRESS NOTE   Assessment/Plan: Guillain-Barre disease: - DTR are present neurology consulted. - MRI no acute abnormality degraded by motion. - check NIF, further manegement per neurology.  Code Status: full Family Communication: none  Disposition Plan: home in 2-3 days   Consultants:  Neuro  Procedures:  MRI  Antibiotics:  none  HPI/Subjective: Still weak and lower ext pain  Objective: Filed Vitals:   05/24/15 1952 05/24/15 1955 05/24/15 2323 05/25/15 0507  BP:  135/79 132/80 128/68  Pulse:  79 77 75  Temp: 98.2 F (36.8 C) 98.2 F (36.8 C) 98 F (36.7 C) 98.1 F (36.7 C)  TempSrc: Oral Oral Oral Oral  Resp:  19 21 21   Height:      Weight:      SpO2:  99% 99% 100%    Intake/Output Summary (Last 24 hours) at 05/25/15 0720 Last data filed at 05/25/15 0600  Gross per 24 hour  Intake   1640 ml  Output    300 ml  Net   1340 ml   Filed Weights   05/24/15 1742  Weight: 136.5 kg (300 lb 14.9 oz)    Exam:  General: Alert, awake, oriented x3, in no acute distress.  HEENT: No bruits, no goiter.  Heart: Regular rate and rhythm. Lungs: Good air movement, clear Abdomen: Soft, nontender, nondistended, positive bowel sounds.  Neuro: b/l lower ext weakness and upper extremity.   Data Reviewed: Basic Metabolic Panel:  Recent Labs Lab 05/20/15 1725 05/24/15 1553 05/24/15 1905 05/25/15 0243  NA 137 139  --  136  K 4.1 3.6  --  3.8  CL 107 106  --  104  CO2 21*  --   --  25  GLUCOSE 95 85  --  82  BUN 14 5*  --  <5*  CREATININE 0.85 0.90 0.88 0.82  CALCIUM 9.5  --   --  8.8*  MG  --   --  1.6*  --   PHOS  --   --  3.2  --    Liver Function Tests: No results for input(s): AST, ALT, ALKPHOS, BILITOT, PROT, ALBUMIN in the last 168 hours. No results for input(s): LIPASE, AMYLASE in the last 168 hours. No results for input(s): AMMONIA in the last 168 hours. CBC:  Recent Labs Lab 05/20/15 1725 05/24/15 1544 05/24/15 1553  05/24/15 1905 05/25/15 0243  WBC 9.3 8.1  --  8.2 6.7  NEUTROABS 5.2 5.1  --   --   --   HGB 14.9 13.5 12.9* 12.9* 12.7*  HCT 42.1 38.3* 38.0* 37.2* 36.9*  MCV 87.9 87.6  --  87.5 87.6  PLT 263 263  --  242 261   Cardiac Enzymes:  Recent Labs Lab 05/20/15 1725  TROPONINI <0.03   BNP (last 3 results) No results for input(s): BNP in the last 8760 hours.  ProBNP (last 3 results) No results for input(s): PROBNP in the last 8760 hours.  CBG: No results for input(s): GLUCAP in the last 168 hours.  Recent Results (from the past 240 hour(s))  MRSA PCR Screening     Status: None   Collection Time: 05/24/15  7:02 PM  Result Value Ref Range Status   MRSA by PCR NEGATIVE NEGATIVE Final    Comment:        The GeneXpert MRSA Assay (FDA approved for NASAL specimens only), is one component of a comprehensive MRSA colonization surveillance program. It is not intended to diagnose MRSA infection nor  to guide or monitor treatment for MRSA infections.      Studies: Dg Chest 2 View  05/24/2015   CLINICAL DATA:  Headache, weakness, shortness of breath.  EXAM: CHEST  2 VIEW  COMPARISON:  05/20/2015 radiographs and CTA.  FINDINGS: Normal sized heart. Clear lungs. Lower thoracic spine degenerative changes.  IMPRESSION: No acute abnormality.   Electronically Signed   By: Beckie Salts M.D.   On: 05/24/2015 17:14   Mr Cervical Spine W Wo Contrast  05/24/2015   CLINICAL DATA:  Recent diagnosis of Guillain-Barre syndrome, treated with IV IG for 5 days, discharged May 18, 2015. Shortness of breath and leg swelling, now with lower extremity paresthesias, bowel incontinence.  EXAM: MRI CERVICAL AND THORACIC SPINE WITHOUT AND WITH CONTRAST  TECHNIQUE: Multiplanar and multiecho pulse sequences of the cervical spine, to include the craniocervical junction and cervicothoracic junction, and thoracic spine, were obtained without and with intravenous contrast.  CONTRAST:  20mL MULTIHANCE GADOBENATE  DIMEGLUMINE 529 MG/ML IV SOLN  COMPARISON:  MRI of the cervical and thoracic spine May 13, 2015.  FINDINGS: Large body habitus results in overall decreased signal to noise ratio.  MRI CERVICAL SPINE FINDINGS  Multiple sequences are motion degraded, moderate to severely motion degraded sagittal T2 and STIR sequence.  Cervical vertebral bodies and posterior elements are intact and aligned with straightened cervical lordosis. Intervertebral discs demonstrate normal morphology and signal characteristics. No abnormal osseous or intradiscal signal nor enhancement.  Normal appearance of the cervical spinal cord given the degree of motion. No abnormal cord, leptomeningeal or epidural enhancement. Included prevertebral and paraspinal soft tissues are normal.  Due to motion, limited level by level evaluation. At C4-5 there is a small central disc protrusion, effacing the ventral thecal sac. Possible mild neural foraminal narrowing.  MRI THORACIC SPINE FINDINGS  Multiple sequences are motion degraded; moderately motion degraded STIR sequence. Thoracic vertebral bodies appear intact and aligned with maintenance of thoracic kyphosis. Intervertebral discs demonstrate normal morphology and signal characteristics. No abnormal osseous signal. No abnormal osseous or intradiscal enhancement.  Limited assessment of thoracic spinal cord due to motion, without definite signal abnormality or syrinx. No abnormal cord, leptomeningeal or epidural enhancement. Included prevertebral and paraspinal soft tissues are normal.  No significant disc bulge, canal stenosis or neural foraminal narrowing giving motion degraded examination.  IMPRESSION: Similarly motion and habitus degraded MRI of the cervical and thoracic spine without acute process, specifically no abnormal enhancement. Limited assessment of cord signal abnormality.  No acute fracture, malalignment or significant neurocompressive changes. Possible mild C4-5 neural foraminal  narrowing.   Electronically Signed   By: Awilda Metro M.D.   On: 05/24/2015 22:23   Mr Thoracic Spine W Wo Contrast  05/24/2015   CLINICAL DATA:  Recent diagnosis of Guillain-Barre syndrome, treated with IV IG for 5 days, discharged May 18, 2015. Shortness of breath and leg swelling, now with lower extremity paresthesias, bowel incontinence.  EXAM: MRI CERVICAL AND THORACIC SPINE WITHOUT AND WITH CONTRAST  TECHNIQUE: Multiplanar and multiecho pulse sequences of the cervical spine, to include the craniocervical junction and cervicothoracic junction, and thoracic spine, were obtained without and with intravenous contrast.  CONTRAST:  20mL MULTIHANCE GADOBENATE DIMEGLUMINE 529 MG/ML IV SOLN  COMPARISON:  MRI of the cervical and thoracic spine May 13, 2015.  FINDINGS: Large body habitus results in overall decreased signal to noise ratio.  MRI CERVICAL SPINE FINDINGS  Multiple sequences are motion degraded, moderate to severely motion degraded sagittal T2 and STIR sequence.  Cervical vertebral bodies and posterior elements are intact and aligned with straightened cervical lordosis. Intervertebral discs demonstrate normal morphology and signal characteristics. No abnormal osseous or intradiscal signal nor enhancement.  Normal appearance of the cervical spinal cord given the degree of motion. No abnormal cord, leptomeningeal or epidural enhancement. Included prevertebral and paraspinal soft tissues are normal.  Due to motion, limited level by level evaluation. At C4-5 there is a small central disc protrusion, effacing the ventral thecal sac. Possible mild neural foraminal narrowing.  MRI THORACIC SPINE FINDINGS  Multiple sequences are motion degraded; moderately motion degraded STIR sequence. Thoracic vertebral bodies appear intact and aligned with maintenance of thoracic kyphosis. Intervertebral discs demonstrate normal morphology and signal characteristics. No abnormal osseous signal. No abnormal osseous or  intradiscal enhancement.  Limited assessment of thoracic spinal cord due to motion, without definite signal abnormality or syrinx. No abnormal cord, leptomeningeal or epidural enhancement. Included prevertebral and paraspinal soft tissues are normal.  No significant disc bulge, canal stenosis or neural foraminal narrowing giving motion degraded examination.  IMPRESSION: Similarly motion and habitus degraded MRI of the cervical and thoracic spine without acute process, specifically no abnormal enhancement. Limited assessment of cord signal abnormality.  No acute fracture, malalignment or significant neurocompressive changes. Possible mild C4-5 neural foraminal narrowing.   Electronically Signed   By: Awilda Metro M.D.   On: 05/24/2015 22:23    Scheduled Meds: . enoxaparin (LOVENOX) injection  40 mg Subcutaneous Q24H   Continuous Infusions: . sodium chloride 75 mL/hr at 05/25/15 0618    Time Spent: 25 min   Marinda Elk  [facility] Pager [phone]. If 7PM-7AM, please contact night-coverage at [email or web address], password [facility] 05/25/2015, 7:20 AM  LOS: 1 day

## 2015-05-25 NOTE — Progress Notes (Signed)
Daily NIF & VC results: VC: 1.1 L, NIF: -22

## 2015-05-25 NOTE — Clinical Social Work Note (Signed)
CSW Consult Acknowledged:   CSW received a consult for medication assist. CSW will inform the case manager. CSW will sign off.   Evyn Putzier, MSW, LCSWA [phone]

## 2015-05-25 NOTE — Progress Notes (Signed)
Assess pt pulmonary mechanics. NIF -42. VC 2.9L

## 2015-05-26 LAB — HIV ANTIBODY (ROUTINE TESTING W REFLEX): HIV SCREEN 4TH GENERATION: NONREACTIVE

## 2015-05-26 LAB — STRIATED MUSCLE ANTIBODY: ANTI-STRIATION ABS: NEGATIVE

## 2015-05-26 MED ORDER — LORAZEPAM 0.5 MG PO TABS
0.5000 mg | ORAL_TABLET | Freq: Every day | ORAL | Status: DC
  Administered 2015-05-26 – 2015-05-29 (×4): 0.5 mg via ORAL
  Filled 2015-05-26 (×4): qty 1

## 2015-05-26 NOTE — Progress Notes (Addendum)
[facility] PROGRESS NOTE   Assessment/Plan: Guillain-Barre disease: - Appreciate neurology assistance. - NIF improved. Neurology recommended no further IVIG. - follow up ANA, myasthenia panel, HIV - would benefit from EMG/NCS as outpatient - transfer to regular floor. PT eval   Code Status: full Family Communication: none  Disposition Plan: home in maybe in am   Consultants:  Neuro  Procedures:  MRI  Antibiotics:  none  HPI/Subjective: Still weak and lower ext pain.  Objective: Filed Vitals:   05/25/15 1950 05/25/15 2315 05/26/15 0311 05/26/15 0315  BP: 135/74 153/86  144/94  Pulse: 84 77  70  Temp: 98.4 F (36.9 C) 98.5 F (36.9 C) 98.5 F (36.9 C)   TempSrc: Oral Oral Oral   Resp: Height:      Weight:      SpO2: 99% 99%  98%    Intake/Output Summary (Last 24 hours) at 05/26/15 0721 Last data filed at 05/26/15 0600  Gross per 24 hour  Intake   2040 ml  Output   2850 ml  Net   -810 ml   Filed Weights   05/24/15 1742  Weight: 136.5 kg (300 lb 14.9 oz)    Exam:  General: Alert, awake, oriented x3, in no acute distress.  HEENT: No bruits, no goiter.  Heart: Regular rate and rhythm. Lungs: Good air movement, clear Abdomen: Soft, nontender, nondistended, positive bowel sounds.  Neuro: b/l lower ext weakness and upper extremity.   Data Reviewed: Basic Metabolic Panel:  Recent Labs Lab 05/20/15 1725 05/24/15 1553 05/24/15 1905 05/25/15 0243  NA 137 139  --  136  K 4.1 3.6  --  3.8  CL 107 106  --  104  CO2 21*  --   --  25  GLUCOSE 95 85  --  82  BUN 14 5*  --  <5*  CREATININE 0.85 0.90 0.88 0.82  CALCIUM 9.5  --   --  8.8*  MG  --   --  1.6*  --   PHOS  --   --  3.2  --    Liver Function Tests: No results for input(s): AST, ALT, ALKPHOS, BILITOT, PROT, ALBUMIN in the last 168 hours. No results for input(s): LIPASE, AMYLASE in the last 168 hours. No results for input(s): AMMONIA in the last 168  hours. CBC:  Recent Labs Lab 05/20/15 1725 05/24/15 1544 05/24/15 1553 05/24/15 1905 05/25/15 0243  WBC 9.3 8.1  --  8.2 6.7  NEUTROABS 5.2 5.1  --   --   --   HGB 14.9 13.5 12.9* 12.9* 12.7*  HCT 42.1 38.3* 38.0* 37.2* 36.9*  MCV 87.9 87.6  --  87.5 87.6  PLT 263 263  --  242 261   Cardiac Enzymes:  Recent Labs Lab 05/20/15 1725 05/25/15 1515  CKTOTAL  --  45*  TROPONINI <0.03  --    BNP (last 3 results) No results for input(s): BNP in the last 8760 hours.  ProBNP (last 3 results) No results for input(s): PROBNP in the last 8760 hours.  CBG: No results for input(s): GLUCAP in the last 168 hours.  Recent Results (from the past 240 hour(s))  MRSA PCR Screening     Status: None   Collection Time: 05/24/15  7:02 PM  Result Value Ref Range Status   MRSA by PCR NEGATIVE NEGATIVE Final    Comment:        The GeneXpert MRSA Assay (FDA approved for NASAL  specimens only), is one component of a comprehensive MRSA colonization surveillance program. It is not intended to diagnose MRSA infection nor to guide or monitor treatment for MRSA infections.      Studies: Dg Chest 2 View  05/24/2015   CLINICAL DATA:  Headache, weakness, shortness of breath.  EXAM: CHEST  2 VIEW  COMPARISON:  05/20/2015 radiographs and CTA.  FINDINGS: Normal sized heart. Clear lungs. Lower thoracic spine degenerative changes.  IMPRESSION: No acute abnormality.   Electronically Signed   By: Beckie Salts M.D.   On: 05/24/2015 17:14   Mr Cervical Spine W Wo Contrast  05/24/2015   CLINICAL DATA:  Recent diagnosis of Guillain-Barre syndrome, treated with IV IG for 5 days, discharged May 18, 2015. Shortness of breath and leg swelling, now with lower extremity paresthesias, bowel incontinence.  EXAM: MRI CERVICAL AND THORACIC SPINE WITHOUT AND WITH CONTRAST  TECHNIQUE: Multiplanar and multiecho pulse sequences of the cervical spine, to include the craniocervical junction and cervicothoracic junction, and  thoracic spine, were obtained without and with intravenous contrast.  CONTRAST:  20mL MULTIHANCE GADOBENATE DIMEGLUMINE 529 MG/ML IV SOLN  COMPARISON:  MRI of the cervical and thoracic spine May 13, 2015.  FINDINGS: Large body habitus results in overall decreased signal to noise ratio.  MRI CERVICAL SPINE FINDINGS  Multiple sequences are motion degraded, moderate to severely motion degraded sagittal T2 and STIR sequence.  Cervical vertebral bodies and posterior elements are intact and aligned with straightened cervical lordosis. Intervertebral discs demonstrate normal morphology and signal characteristics. No abnormal osseous or intradiscal signal nor enhancement.  Normal appearance of the cervical spinal cord given the degree of motion. No abnormal cord, leptomeningeal or epidural enhancement. Included prevertebral and paraspinal soft tissues are normal.  Due to motion, limited level by level evaluation. At C4-5 there is a small central disc protrusion, effacing the ventral thecal sac. Possible mild neural foraminal narrowing.  MRI THORACIC SPINE FINDINGS  Multiple sequences are motion degraded; moderately motion degraded STIR sequence. Thoracic vertebral bodies appear intact and aligned with maintenance of thoracic kyphosis. Intervertebral discs demonstrate normal morphology and signal characteristics. No abnormal osseous signal. No abnormal osseous or intradiscal enhancement.  Limited assessment of thoracic spinal cord due to motion, without definite signal abnormality or syrinx. No abnormal cord, leptomeningeal or epidural enhancement. Included prevertebral and paraspinal soft tissues are normal.  No significant disc bulge, canal stenosis or neural foraminal narrowing giving motion degraded examination.  IMPRESSION: Similarly motion and habitus degraded MRI of the cervical and thoracic spine without acute process, specifically no abnormal enhancement. Limited assessment of cord signal abnormality.  No acute  fracture, malalignment or significant neurocompressive changes. Possible mild C4-5 neural foraminal narrowing.   Electronically Signed   By: Awilda Metro M.D.   On: 05/24/2015 22:23   Mr Thoracic Spine W Wo Contrast  05/24/2015   CLINICAL DATA:  Recent diagnosis of Guillain-Barre syndrome, treated with IV IG for 5 days, discharged May 18, 2015. Shortness of breath and leg swelling, now with lower extremity paresthesias, bowel incontinence.  EXAM: MRI CERVICAL AND THORACIC SPINE WITHOUT AND WITH CONTRAST  TECHNIQUE: Multiplanar and multiecho pulse sequences of the cervical spine, to include the craniocervical junction and cervicothoracic junction, and thoracic spine, were obtained without and with intravenous contrast.  CONTRAST:  20mL MULTIHANCE GADOBENATE DIMEGLUMINE 529 MG/ML IV SOLN  COMPARISON:  MRI of the cervical and thoracic spine May 13, 2015.  FINDINGS: Large body habitus results in overall decreased signal to noise ratio.  MRI CERVICAL SPINE FINDINGS  Multiple sequences are motion degraded, moderate to severely motion degraded sagittal T2 and STIR sequence.  Cervical vertebral bodies and posterior elements are intact and aligned with straightened cervical lordosis. Intervertebral discs demonstrate normal morphology and signal characteristics. No abnormal osseous or intradiscal signal nor enhancement.  Normal appearance of the cervical spinal cord given the degree of motion. No abnormal cord, leptomeningeal or epidural enhancement. Included prevertebral and paraspinal soft tissues are normal.  Due to motion, limited level by level evaluation. At C4-5 there is a small central disc protrusion, effacing the ventral thecal sac. Possible mild neural foraminal narrowing.  MRI THORACIC SPINE FINDINGS  Multiple sequences are motion degraded; moderately motion degraded STIR sequence. Thoracic vertebral bodies appear intact and aligned with maintenance of thoracic kyphosis. Intervertebral discs  demonstrate normal morphology and signal characteristics. No abnormal osseous signal. No abnormal osseous or intradiscal enhancement.  Limited assessment of thoracic spinal cord due to motion, without definite signal abnormality or syrinx. No abnormal cord, leptomeningeal or epidural enhancement. Included prevertebral and paraspinal soft tissues are normal.  No significant disc bulge, canal stenosis or neural foraminal narrowing giving motion degraded examination.  IMPRESSION: Similarly motion and habitus degraded MRI of the cervical and thoracic spine without acute process, specifically no abnormal enhancement. Limited assessment of cord signal abnormality.  No acute fracture, malalignment or significant neurocompressive changes. Possible mild C4-5 neural foraminal narrowing.   Electronically Signed   By: Awilda Metro M.D.   On: 05/24/2015 22:23    Scheduled Meds: . enoxaparin (LOVENOX) injection  40 mg Subcutaneous Q24H   Continuous Infusions: . sodium chloride 75 mL/hr at 05/26/15 0600    Time Spent: 25 min   Marinda Elk  [facility] Pager [phone]. If 7PM-7AM, please contact night-coverage at [email or web address], password [facility] 05/26/2015, 7:21 AM  LOS: 2 days

## 2015-05-26 NOTE — Progress Notes (Signed)
Subjective: No overnight events. Continued weakness in LE>UE. Lab workup unremarkable. NiF and VC improved.   MRI C and T spine imaging reviewed and overall unremarkable.   Objective: Current vital signs: BP 144/94 mmHg  Pulse 70  Temp(Src) 98.5 F (36.9 C) (Oral)  Resp 20  Ht 6\' 1"  (1.854 m)  Wt 136.5 kg (300 lb 14.9 oz)  BMI 39.71 kg/m2  SpO2 98% Vital signs in last 24 hours: Temp:  [97.6 F (36.4 C)-98.5 F (36.9 C)] 98.5 F (36.9 C) (09/06 0311) Pulse Rate:  [70-84] 70 (09/06 0315) Resp:  [17-26] 20 (09/06 0315) BP: (123-153)/(74-94) 144/94 mmHg (09/06 0315) SpO2:  [96 %-99 %] 98 % (09/06 0315)  Intake/Output from previous day: 09/05 0701 - 09/06 0700 In: 2040 [P.O.:240; I.V.:1800] Out: 2850 [Urine:2850] Intake/Output this shift:   Nutritional status: Diet regular Room service appropriate?: Yes; Fluid consistency:: Thin  Neurologic Exam: Mental Status: Alert, oriented, thought content appropriate. Speech fluent without evidence of aphasia.  Cranial Nerves: II: Visual fields grossly normal, pupils equal, round, reactive to light  III,IV, VI: ptosis not present, extra-ocular motions intact bilaterally V,VII: smile symmetric, facial light touch sensation normal bilaterally Motor: There is frank lack of effort and giveaway weakness on testing of the upper and lower extremities. With distraction his strength appears intact Tone and bulk:normal tone throughout; no atrophy noted Sensory: Pinprick and light touch subjectively impaired from the waist down and upper extremities. Deep Tendon Reflexes:  Preserved 2+ biceps and knee jerks bilaterally   Lab Results: Basic Metabolic Panel:  Recent Labs Lab 05/20/15 1725 05/24/15 1553 05/24/15 1905 05/25/15 0243  NA 137 139  --  136  K 4.1 3.6  --  3.8  CL 107 106  --  104  CO2 21*  --   --  25  GLUCOSE 95 85  --  82  BUN 14 5*  --  <5*  CREATININE 0.85 0.90 0.88 0.82  CALCIUM 9.5  --   --  8.8*  MG  --   --   1.6*  --   PHOS  --   --  3.2  --     Liver Function Tests: No results for input(s): AST, ALT, ALKPHOS, BILITOT, PROT, ALBUMIN in the last 168 hours. No results for input(s): LIPASE, AMYLASE in the last 168 hours. No results for input(s): AMMONIA in the last 168 hours.  CBC:  Recent Labs Lab 05/20/15 1725 05/24/15 1544 05/24/15 1553 05/24/15 1905 05/25/15 0243  WBC 9.3 8.1  --  8.2 6.7  NEUTROABS 5.2 5.1  --   --   --   HGB 14.9 13.5 12.9* 12.9* 12.7*  HCT 42.1 38.3* 38.0* 37.2* 36.9*  MCV 87.9 87.6  --  87.5 87.6  PLT 263 263  --  242 261    Cardiac Enzymes:  Recent Labs Lab 05/20/15 1725 05/25/15 1515  CKTOTAL  --  45*  TROPONINI <0.03  --     Lipid Panel: No results for input(s): CHOL, TRIG, HDL, CHOLHDL, VLDL, LDLCALC in the last 168 hours.  CBG: No results for input(s): GLUCAP in the last 168 hours.  Microbiology: Results for orders placed or performed during the hospital encounter of 05/24/15  MRSA PCR Screening     Status: None   Collection Time: 05/24/15  7:02 PM  Result Value Ref Range Status   MRSA by PCR NEGATIVE NEGATIVE Final    Comment:        The GeneXpert MRSA Assay (FDA approved  for NASAL specimens only), is one component of a comprehensive MRSA colonization surveillance program. It is not intended to diagnose MRSA infection nor to guide or monitor treatment for MRSA infections.     Coagulation Studies: No results for input(s): LABPROT, INR in the last 72 hours.  Imaging: Dg Chest 2 View  05/24/2015   CLINICAL DATA:  Headache, weakness, shortness of breath.  EXAM: CHEST  2 VIEW  COMPARISON:  05/20/2015 radiographs and CTA.  FINDINGS: Normal sized heart. Clear lungs. Lower thoracic spine degenerative changes.  IMPRESSION: No acute abnormality.   Electronically Signed   By: Beckie Salts M.D.   On: 05/24/2015 17:14   Mr Cervical Spine W Wo Contrast  05/24/2015   CLINICAL DATA:  Recent diagnosis of Guillain-Barre syndrome, treated with  IV IG for 5 days, discharged May 18, 2015. Shortness of breath and leg swelling, now with lower extremity paresthesias, bowel incontinence.  EXAM: MRI CERVICAL AND THORACIC SPINE WITHOUT AND WITH CONTRAST  TECHNIQUE: Multiplanar and multiecho pulse sequences of the cervical spine, to include the craniocervical junction and cervicothoracic junction, and thoracic spine, were obtained without and with intravenous contrast.  CONTRAST:  20mL MULTIHANCE GADOBENATE DIMEGLUMINE 529 MG/ML IV SOLN  COMPARISON:  MRI of the cervical and thoracic spine May 13, 2015.  FINDINGS: Large body habitus results in overall decreased signal to noise ratio.  MRI CERVICAL SPINE FINDINGS  Multiple sequences are motion degraded, moderate to severely motion degraded sagittal T2 and STIR sequence.  Cervical vertebral bodies and posterior elements are intact and aligned with straightened cervical lordosis. Intervertebral discs demonstrate normal morphology and signal characteristics. No abnormal osseous or intradiscal signal nor enhancement.  Normal appearance of the cervical spinal cord given the degree of motion. No abnormal cord, leptomeningeal or epidural enhancement. Included prevertebral and paraspinal soft tissues are normal.  Due to motion, limited level by level evaluation. At C4-5 there is a small central disc protrusion, effacing the ventral thecal sac. Possible mild neural foraminal narrowing.  MRI THORACIC SPINE FINDINGS  Multiple sequences are motion degraded; moderately motion degraded STIR sequence. Thoracic vertebral bodies appear intact and aligned with maintenance of thoracic kyphosis. Intervertebral discs demonstrate normal morphology and signal characteristics. No abnormal osseous signal. No abnormal osseous or intradiscal enhancement.  Limited assessment of thoracic spinal cord due to motion, without definite signal abnormality or syrinx. No abnormal cord, leptomeningeal or epidural enhancement. Included prevertebral  and paraspinal soft tissues are normal.  No significant disc bulge, canal stenosis or neural foraminal narrowing giving motion degraded examination.  IMPRESSION: Similarly motion and habitus degraded MRI of the cervical and thoracic spine without acute process, specifically no abnormal enhancement. Limited assessment of cord signal abnormality.  No acute fracture, malalignment or significant neurocompressive changes. Possible mild C4-5 neural foraminal narrowing.   Electronically Signed   By: Awilda Metro M.D.   On: 05/24/2015 22:23   Mr Thoracic Spine W Wo Contrast  05/24/2015   CLINICAL DATA:  Recent diagnosis of Guillain-Barre syndrome, treated with IV IG for 5 days, discharged May 18, 2015. Shortness of breath and leg swelling, now with lower extremity paresthesias, bowel incontinence.  EXAM: MRI CERVICAL AND THORACIC SPINE WITHOUT AND WITH CONTRAST  TECHNIQUE: Multiplanar and multiecho pulse sequences of the cervical spine, to include the craniocervical junction and cervicothoracic junction, and thoracic spine, were obtained without and with intravenous contrast.  CONTRAST:  20mL MULTIHANCE GADOBENATE DIMEGLUMINE 529 MG/ML IV SOLN  COMPARISON:  MRI of the cervical and thoracic spine May 13, 2015.  FINDINGS: Large body habitus results in overall decreased signal to noise ratio.  MRI CERVICAL SPINE FINDINGS  Multiple sequences are motion degraded, moderate to severely motion degraded sagittal T2 and STIR sequence.  Cervical vertebral bodies and posterior elements are intact and aligned with straightened cervical lordosis. Intervertebral discs demonstrate normal morphology and signal characteristics. No abnormal osseous or intradiscal signal nor enhancement.  Normal appearance of the cervical spinal cord given the degree of motion. No abnormal cord, leptomeningeal or epidural enhancement. Included prevertebral and paraspinal soft tissues are normal.  Due to motion, limited level by level evaluation.  At C4-5 there is a small central disc protrusion, effacing the ventral thecal sac. Possible mild neural foraminal narrowing.  MRI THORACIC SPINE FINDINGS  Multiple sequences are motion degraded; moderately motion degraded STIR sequence. Thoracic vertebral bodies appear intact and aligned with maintenance of thoracic kyphosis. Intervertebral discs demonstrate normal morphology and signal characteristics. No abnormal osseous signal. No abnormal osseous or intradiscal enhancement.  Limited assessment of thoracic spinal cord due to motion, without definite signal abnormality or syrinx. No abnormal cord, leptomeningeal or epidural enhancement. Included prevertebral and paraspinal soft tissues are normal.  No significant disc bulge, canal stenosis or neural foraminal narrowing giving motion degraded examination.  IMPRESSION: Similarly motion and habitus degraded MRI of the cervical and thoracic spine without acute process, specifically no abnormal enhancement. Limited assessment of cord signal abnormality.  No acute fracture, malalignment or significant neurocompressive changes. Possible mild C4-5 neural foraminal narrowing.   Electronically Signed   By: Awilda Metro M.D.   On: 05/24/2015 22:23    Medications:  Scheduled: . enoxaparin (LOVENOX) injection  40 mg Subcutaneous Q24H  . LORazepam  0.5 mg Oral QHS    Assessment/Plan: 24 y/o male recently admitted to [facility] with diagnosis of GBS, transferred to [facility] due to worsening of the previously described symptomatology. This is his second evaluation by neurology since his discharge from [facility] on 05/18/15 with diagnosis of GBS which was treated with IVIG x 5 days. The most salient finding of his neuro evaluation today is that he has preserved DTR's and apparent functional motor weakness of lower and upper extremities. MRI C and T spine imaging unremarkable. Would hold on L spine imaging as he has UE symptoms too.   Overall his presentation is not consistent with a  worsening GBS as his weakness appears effort dependent and his reflexes remain intact. Suspect there is a strong functional overlay to his presentation. He would benefit from an EMG/NCS which can only be done as outpatient.   -follow up ANA, myasthenia panel, HIV -would benefit from EMG/NCS as outpatient -due to low suspicion of GBS would hold on repeat IVIG or PLEX at this time.  -Needs rehab evaluation.  -Will follow as needed. No further workup indicated at this time    LOS: 2 days   Elspeth Cho, DO [facility] [phone]  If 7pm- 7am, please page neurology on call as listed in AMION. 05/26/2015  7:38 AM

## 2015-05-26 NOTE — Progress Notes (Signed)
Late Entry Note for Initial Eval this am.  Pt admitted with/for MS.  Pt currently limited functionally due to the problems listed. ( See problems list.)   Pt will benefit from PT to maximize function and safety in order to get ready for next venue listed below.   05/26/15 1141  PT Visit Information  Last PT Received On 05/26/15  Assistance Needed +2 (if away from bed/chair)  History of Present Illness 24 y.o. male with recent diagnosis of GBS treated with IVIG x 5 days at [facility] and was discharged on 05/18/15, he now presented back to [facility] ED with main concern of persistent weakness in LE and headache and was unable to ambulate in the past 24 hours and was associated with numbness and loss of sensation in the lower extremities  Precautions  Precautions Fall  Precaution Comments absent sensation LB, absent RUE, impaired LUE  Home Living  Family/patient expects to be discharged to: Private residence  Living Arrangements Parent  Available Help at Discharge Available PRN/intermittently  Type of Home House  Home Access Stairs to enter  Entrance Stairs-Number of Steps 3  Entrance Stairs-Rails Right;Left (rigcketty)  Home Layout One level  Bathroom Environmental health practitioner None  Prior Function  Level of Independence Independent  Comments worked in Education officer, environmental No difficulties  Pain Assessment  Pain Assessment 0-10  Pain Score 10  Pain Location headache  Pain Descriptors / Indicators Aching  Pain Intervention(s) Patient requesting pain meds-RN notified  Cognition  Arousal/Alertness Awake/alert  Behavior During Therapy Flat affect;WFL for tasks assessed/performed  Overall Cognitive Status Within Functional Limits for tasks assessed  Upper Extremity Assessment  Upper Extremity Assessment Defer to OT evaluation (weak, numbness/tingling and co contracting  bil)  Lower Extremity Assessment  Lower Extremity Assessment  RLE deficits/detail;LLE deficits/detail  RLE Deficits / Details hip flexion 2+/3-/5, knee extension 3+/5, df/pf 3/5  RLE Sensation decreased light touch (decreased pressure)  RLE Coordination decreased fine motor;decreased gross motor  LLE Deficits / Details hip flexors 2+ to 3-/5 knee extension 3+/5, df/pf 3/5  LLE Sensation decreased light touch (decreased pressure)  LLE Coordination decreased gross motor;decreased fine motor  Bed Mobility  Overal bed mobility Needs Assistance  Bed Mobility Supine to Sit  Supine to sit Min guard  General bed mobility comments effortful transition from supine up onto L elbow and scooting to EoB.  Slow, with much time needed, but no assist needed  Transfers  Overall transfer level Needs assistance  Equipment used Rolling walker (2 wheeled)  Transfers Sit to/from [employer] Transfers  Sit to Stand Mod assist (+2 for safety )  Stand pivot transfers Mod assist;+2 safety/equipment  General transfer comment posterior LOB upon standing ,  Stability assist during pivot with relativiely locked knees  Ambulation/Gait  General Gait Details not appropriate today  Balance  Overall balance assessment Needs assistance  Sitting-balance support Single extremity supported;No upper extremity supported  Sitting balance-Leahy Scale Fair  Standing balance-Leahy Scale Poor  Standing balance comment standing trial x2 with bilateral UE's and lifting/stability assist  Exercises  Exercises (warm up hip/knee flexion/ext ROM exercise prior to mobility)  PT - End of Session  Activity Tolerance Patient tolerated treatment well  Patient left in chair;with call bell/phone within reach  Nurse Communication Mobility status  PT Assessment  PT Therapy Diagnosis  Generalized weakness;Acute pain  PT Recommendation/Assessment Patient needs continued PT services  PT Problem List Decreased strength;Decreased range of motion;Decreased  activity tolerance;Decreased  balance;Decreased mobility;Decreased coordination;Decreased knowledge of use of DME;Impaired sensation;Pain  PT Plan  PT Frequency (ACUTE ONLY) Min 4X/week  PT Treatment/Interventions (ACUTE ONLY) DME instruction;Gait training;Functional mobility training;Therapeutic activities;Therapeutic exercise;Balance training;Patient/family education  PT Recommendation  Recommendations for Other Services Rehab consult  Follow Up Recommendations CIR  PT equipment Wheelchair (measurements PT);Rolling walker with 5" wheels  Individuals Consulted  Consulted and Agree with Results and Recommendations Patient  Acute Rehab PT Goals  Patient Stated Goal Need to be back independent  PT Goal Formulation With patient  Time For Goal Achievement 06/09/15  Potential to Achieve Goals Good  PT Time Calculation  PT Start Time (ACUTE ONLY) 1046  PT Stop Time (ACUTE ONLY) 1130  PT Time Calculation (min) (ACUTE ONLY) 44 min  PT General Charges  $$ ACUTE PT VISIT 1 Procedure  PT Evaluation  $Initial PT Evaluation Tier I 1 Procedure  PT Treatments  $Therapeutic Activity 23-37 mins    05/26/2015  Eddington Bing, PT [phone]  (pager)

## 2015-05-26 NOTE — Progress Notes (Signed)
Patient trasfered from 3S to [phone] via bed; alert and oriented x 4; no complaints of pain; IV in right wrist running NS@75cc /hr; skin intact. Orient patient to room and unit; instructed how to use the call bell and  fall risk precautions. Will continue to monitor the patient.

## 2015-05-26 NOTE — Progress Notes (Signed)
NIF -24 FVC 1.1 L

## 2015-05-26 NOTE — Progress Notes (Addendum)
Rehab Admissions Coordinator Note:  Patient was screened by Clois Dupes for appropriateness for an Inpatient Acute Rehab Consult per PT recommendation.  At this time, we are recommending Inpatient Rehab consult. I will contact Dr. Robb Matar for order. Noted Dr. Wynn Banker saw pt at [facility] in consultation 8/25 at initial workup with inconsistent examination noted and further recommendations.   Clois Dupes 05/26/2015, 4:37 PM  I can be reached at [phone].

## 2015-05-26 NOTE — Progress Notes (Signed)
NIF -22. VC 1L. Patient had good effort

## 2015-05-27 ENCOUNTER — Encounter (HOSPITAL_COMMUNITY): Payer: Self-pay | Admitting: Physical Medicine and Rehabilitation

## 2015-05-27 ENCOUNTER — Telehealth: Payer: Self-pay

## 2015-05-27 DIAGNOSIS — G44039 Episodic paroxysmal hemicrania, not intractable: Secondary | ICD-10-CM

## 2015-05-27 DIAGNOSIS — R5381 Other malaise: Secondary | ICD-10-CM

## 2015-05-27 LAB — ACETYLCHOLINE RECEPTOR, BINDING: ACETYLCHOLINE BINDING AB: 0.1 nmol/L (ref 0.00–0.24)

## 2015-05-27 MED ORDER — HYDROCODONE-ACETAMINOPHEN 5-325 MG PO TABS
1.0000 | ORAL_TABLET | Freq: Four times a day (QID) | ORAL | Status: DC | PRN
  Administered 2015-05-27: 2 via ORAL
  Filled 2015-05-27: qty 2

## 2015-05-27 MED ORDER — VALPROATE SODIUM 500 MG/5ML IV SOLN: 500.0000 mg | Freq: Two times a day (BID) | INTRAVENOUS | Status: DC

## 2015-05-27 MED ORDER — KETOROLAC TROMETHAMINE 30 MG/ML IJ SOLN
30.0000 mg | Freq: Once | INTRAMUSCULAR | Status: AC
  Administered 2015-05-27: 30 mg via INTRAVENOUS
  Filled 2015-05-27: qty 1

## 2015-05-27 MED ORDER — VALPROATE SODIUM 500 MG/5ML IV SOLN
500.0000 mg | Freq: Two times a day (BID) | INTRAVENOUS | Status: AC
  Administered 2015-05-27 – 2015-05-28 (×2): 500 mg via INTRAVENOUS
  Filled 2015-05-27 (×2): qty 5

## 2015-05-27 MED ORDER — TRAMADOL HCL 50 MG PO TABS
50.0000 mg | ORAL_TABLET | Freq: Three times a day (TID) | ORAL | Status: DC | PRN
  Administered 2015-05-27: 50 mg via ORAL
  Filled 2015-05-27: qty 1

## 2015-05-27 MED ORDER — OXYCODONE-ACETAMINOPHEN 5-325 MG PO TABS
1.0000 | ORAL_TABLET | ORAL | Status: DC | PRN
  Administered 2015-05-28 (×3): 2 via ORAL
  Administered 2015-05-28: 1 via ORAL
  Administered 2015-05-29: 2 via ORAL
  Filled 2015-05-27 (×3): qty 2
  Filled 2015-05-27 (×2): qty 1
  Filled 2015-05-27 (×3): qty 2

## 2015-05-27 NOTE — Progress Notes (Signed)
Physical Therapy Treatment Patient Details Name: Donald Ashmead Sadowsky Jr. MRN: [id number] DOB: 1991-05-30 Today's Date: 05/27/2015    History of Present Illness 24 y.o. male with recent diagnosis of GBS treated with IVIG x 5 days at [facility] and was discharged on 05/18/15, he now presented back to [facility] ED with main concern of persistent weakness in LE and headache and was unable to ambulate in the past 24 hours and was associated with numbness and loss of sensation in the lower extremities    PT Comments    Slow progress towards functional goals. Able to stand and tolerate balance and pre-gait training with min assist. Declines transfer training to chair. Educated and performed therapeutic exercises. Encouraged to perform on his own as tolerated to improve strength. Patient will continue to benefit from skilled physical therapy services to further improve independence with functional mobility.   Follow Up Recommendations  Home health PT (Denied by CIR)  If fails to progress may need to consider SNF placement. However, if pt returns home will likely require ambulance transport due to inability to navigate stairs.     Equipment Recommendations  Wheelchair (measurements PT);Rolling walker with 5" wheels    Recommendations for Other Services       Precautions / Restrictions Precautions Precautions: Fall Restrictions Weight Bearing Restrictions: No    Mobility  Bed Mobility Overal bed mobility: Needs Assistance Bed Mobility: Supine to Sit;Sit to Supine     Supine to sit: Supervision Sit to supine: Supervision   General bed mobility comments: Slow to move LEs out of bed. Not as difficult brining LEs back into bed. Good trunk control and ability to push and lower self to/from bed.  Transfers Overall transfer level: Needs assistance Equipment used: Rolling walker (2 wheeled) Transfers: Sit to/from Stand Sit to Stand: Min assist         General transfer comment: Very effortful for pt  to rise from lowest bed setting. VC for hand placement and encouraged use of LEs as able. Min assist for boost and balance upon standing as he leans posteriorly. VC for forward weight shift and to weight bear through UEs as needed. performed x2 with decreased control during descent.  Ambulation/Gait             General Gait Details: not appropriate today   Stairs            Wheelchair Mobility    Modified Rankin (Stroke Patients Only)       Balance Overall balance assessment: Needs assistance Sitting-balance support: No upper extremity supported;Feet supported Sitting balance-Leahy Scale: Good     Standing balance support: Bilateral upper extremity supported Standing balance-Leahy Scale: Poor Standing balance comment: Practiced balance activities and pre-gait training while standing with UE support on RW. Tolerated standing approx 8 minutes x2 bouts. Progressing from lateral weight-shifting with feet planted, to lateral weight-shifting with foot clearance, and finally lateral weight shifting with hip flexion for foot clearance. More difficulty lifting RLE from floor. Encouraged to reduce UE pressure as able throughout.  Required 1 seated rest break.                    Cognition Arousal/Alertness: Awake/alert Behavior During Therapy: WFL for tasks assessed/performed Overall Cognitive Status: Within Functional Limits for tasks assessed                      Exercises General Exercises - Lower Extremity Ankle Circles/Pumps: AROM;Both;10 reps;Supine Long Arc Quad: Strengthening;Both;10 reps;Seated (minimal  lift on Lt.) Toe Raises: Strengthening;Both;5 reps (could not perform on Lt) Heel Raises:  (could not perform) Mini-Sqauts: Strengthening;Both;10 reps;Standing Other Exercises Other Exercises: Bridging x10 in bed for strength.    General Comments General comments (skin integrity, edema, etc.): Poor effort with long-arc quad exercises, not consistent  with ability to stand.      Pertinent Vitals/Pain Pain Assessment: 0-10 Pain Score: 10-Worst pain ever Pain Location: headache Pain Descriptors / Indicators: Aching Pain Intervention(s): Monitored during session;Repositioned    Home Living                      Prior Function            PT Goals (current goals can now be found in the care plan section) Acute Rehab PT Goals Patient Stated Goal: Need to be back independent PT Goal Formulation: With patient Time For Goal Achievement: 06/09/15 Potential to Achieve Goals: Good Progress towards PT goals: Progressing toward goals    Frequency  Min 4X/week    PT Plan Current plan remains appropriate    Co-evaluation             End of Session Equipment Utilized During Treatment: Gait belt Activity Tolerance: Patient tolerated treatment well Patient left: with call bell/phone within reach;in bed;with bed alarm set;with family/visitor present     Time: 0102-7253 PT Time Calculation (min) (ACUTE ONLY): 24 min  Charges:  $Therapeutic Exercise: 8-22 mins $Therapeutic Activity: 8-22 mins                    G Codes:      Donald Mckay 06/04/15, 5:47 PM Donald Mckay, Donald Mckay [phone]

## 2015-05-27 NOTE — Progress Notes (Signed)
RT note- NIF -40cmh2o  and FVC- 1.7L/min

## 2015-05-27 NOTE — Progress Notes (Signed)
RT note-NIF-40cmh2o and FVC- 2.4L/min

## 2015-05-27 NOTE — Progress Notes (Signed)
[facility] PROGRESS NOTE   Assessment/Plan: Guillain-Barre disease: -Appreciate neurology assistance; NIF improved. Neurology recommended no further IVIG; will follow rec's -follow up ANA, myasthenia panel -HIV neg -would benefit from EMG/NCS as outpatient -continue PT eval .  HA's: has failed to improved with tylenol and vicodin -will minimize narcotics due to rebound effect -will give depacon IV q12h X 2 doses  Code Status: full Family Communication: none  Disposition Plan: home with home health services in the next 24-48 hours.   Consultants:  Neuro  Inpatient Rehab  Procedures:  MRI  Antibiotics:  none  HPI/Subjective: Still complaining of weakness and HA's.   Objective: Filed Vitals:   05/26/15 1838 05/26/15 2255 05/27/15 0633 05/27/15 1319  BP: 151/77 145/80 147/84 143/83  Pulse: 75 68 66 75  Temp: 98.7 F (37.1 C) 97.8 F (36.6 C) 97.5 F (36.4 C) 98.2 F (36.8 C)  TempSrc: Oral Oral Oral Oral  Resp: 17 16 17 18   Height:      Weight:      SpO2: 99% 100% 99% 99%    Intake/Output Summary (Last 24 hours) at 05/27/15 1629 Last data filed at 05/27/15 1529  Gross per 24 hour  Intake   1136 ml  Output   1850 ml  Net   -714 ml   Filed Weights   05/24/15 1742  Weight: 136.5 kg (300 lb 14.9 oz)    Exam:  General: Alert, awake, oriented x3, complaining of HA; no CP or SOB. Patient with flat affect   HEENT: No bruits, no goiter, no JVD Heart: Regular rate and rhythm; no murmur, rubs or gallops Lungs: Good air movement, CTA bilaterally  Abdomen: Soft, nontender, nondistended, positive bowel sounds.  Neuro: b/l lower ext weakness and upper extremity weakness (Lower > upper).   Data Reviewed: Basic Metabolic Panel:  Recent Labs Lab 05/20/15 1725 05/24/15 1553 05/24/15 1905 05/25/15 0243  NA 137 139  --  136  K 4.1 3.6  --  3.8  CL 107 106  --  104  CO2 21*  --   --  25  GLUCOSE 95 85  --  82  BUN 14 5*  --  <5*    CREATININE 0.85 0.90 0.88 0.82  CALCIUM 9.5  --   --  8.8*  MG  --   --  1.6*  --   PHOS  --   --  3.2  --    CBC:  Recent Labs Lab 05/20/15 1725 05/24/15 1544 05/24/15 1553 05/24/15 1905 05/25/15 0243  WBC 9.3 8.1  --  8.2 6.7  NEUTROABS 5.2 5.1  --   --   --   HGB 14.9 13.5 12.9* 12.9* 12.7*  HCT 42.1 38.3* 38.0* 37.2* 36.9*  MCV 87.9 87.6  --  87.5 87.6  PLT 263 263  --  242 261   Cardiac Enzymes:  Recent Labs Lab 05/20/15 1725 05/25/15 1515  CKTOTAL  --  45*  TROPONINI <0.03  --    CBG: No results for input(s): GLUCAP in the last 168 hours.  Recent Results (from the past 240 hour(s))  MRSA PCR Screening     Status: None   Collection Time: 05/24/15  7:02 PM  Result Value Ref Range Status   MRSA by PCR NEGATIVE NEGATIVE Final    Comment:        The GeneXpert MRSA Assay (FDA approved for NASAL specimens only), is one component of a comprehensive MRSA colonization surveillance program. It is not  intended to diagnose MRSA infection nor to guide or monitor treatment for MRSA infections.      Studies: No results found.  Scheduled Meds: . enoxaparin (LOVENOX) injection  40 mg Subcutaneous Q24H  . LORazepam  0.5 mg Oral QHS  . valproate sodium  500 mg Intravenous Q12H   Continuous Infusions: . sodium chloride 1,000 mL (05/27/15 1304)    Time Spent: 25 min   Vassie Loll  [facility] Pager [phone]. If 7PM-7AM, please contact night-coverage at [email or web address], password [facility] 05/27/2015, 4:29 PM  LOS: 3 days

## 2015-05-27 NOTE — Care Management Note (Addendum)
Case Management Note  Patient Details   Name: Donald Mckay. MRN: [id number] Date of Birth: July 06, 1991  Subjective/Objective:       Patient transfer from SDU, Patient lives with mom, patient , per pt eval, patient is unable to ambulate at this time 9/7 and on 9/6 no ambulation and 2 plus ast, per CIR rec HH.   CSW referral.  NCM will cont to follow for dc needs.             Action/Plan:   Expected Discharge Date:                  Expected Discharge Plan:  Skilled Nursing Facility  In-House Referral:  Clinical Social Work  Discharge planning Services  CM Consult  Post Acute Care Choice:    Choice offered to:     DME Arranged:    DME Agency:     HH Arranged:    HH Agency:     Status of Service:  In process, will continue to follow  Medicare Important Message Given:    Date Medicare IM Given:    Medicare IM give by:    Date Additional Medicare IM Given:    Additional Medicare Important Message give by:     If discussed at Long Length of Stay Meetings, dates discussed:    Additional Comments:  Leone Haven, RN 05/27/2015, 6:21 PM

## 2015-05-27 NOTE — Progress Notes (Signed)
Rehab admissions - Please see rehab consult done today by Dr. Wynn Banker recommending [facility] therapies for follow-up.  Not deemed appropriate candidate for acute inpatient rehab admission.  Call me for questions.  #[phone]

## 2015-05-27 NOTE — Telephone Encounter (Signed)
Call received from Letha Cape, CM requesting a hospital follow up appointment for the patient.   After checking with Esmond Plants., [facility] scheduler, it was determined that the patient already has an appointment to establish care at the sickle cell clinic on 06/03/15 @ 0930.  An update was provided to D. Ladona Ridgel informing her that the patient already has an appointment to establish care.

## 2015-05-27 NOTE — Consult Note (Signed)
Physical Medicine and Rehabilitation Consult  Reason for Consult: Weakness with inability to walk Referring Physician: Dr. Gwenlyn Perking.    HPI: Donald Mckay. is a 24 y.o. male with history of depression otherwise in reasonably good health till admission to [facility] on 05/11/15 for progressive upward numbness of BLE with weakness but preserved reflexes. LP was positive for elevated protein.  He was treated with IVIG for GBS and was stared on Wellbutrin for depressed by Dr. Elsie Saas.  He was evaluated in ED on 08/31 with SOB and LE edema--CTA chest and BLE dopplers negative. He was readmitted on 05/24/15  with complaints of HA, BLE weakness and difficulty swallowing. UDS positive for opiates and THC. TSH - 4.871, Vit B 12- 248 and CK- 45.  elevatedNeurology has been following for input and feels that strong functional overlay affecting presentation as workup has been unremarkable and exam appears effort dependent with intact reflexes.  Recommendations for NCS/EMG on outpatient basis. PT evaluation done yesterday and CIR recommended due to functional deficits.   Patient known to me from prior admission to [facility] No bowel or bladder issues other than constipation  Review of Systems  Constitutional: Positive for malaise/fatigue.       Lack of appetite for weeks.   HENT: Negative for hearing loss.   Eyes: Positive for blurred vision.  Respiratory: Negative for cough, shortness of breath and wheezing.   Cardiovascular: Positive for chest pain (intermittent ).  Gastrointestinal: Positive for constipation (No BM since last admission). Negative for heartburn, nausea and vomiting.  Genitourinary: Negative for dysuria, urgency and frequency.       Decrease in urination.  Musculoskeletal: Positive for myalgias (right hand pain due to IV). Negative for back pain, joint pain and neck pain.  Neurological: Positive for dizziness, tingling, sensory change, focal weakness, weakness and  headaches (constant for past 2 weeks).  Psychiatric/Behavioral: Positive for depression. Negative for memory loss. The patient is not nervous/anxious.       Past Medical History  Diagnosis Date  . Medical history non-contributory   . Neuromuscular disorder     GBS  . Depression 2012    History of self injury.    Past Surgical History  Procedure Laterality Date  . Nasal cauterization     Family History  Problem Relation Age of Onset  . Cancer Mother   . Stroke Maternal Grandmother     Social History:  Single. Lives with mother who goes out of town every other week (CNA? Nurse?)  Used to work in [employer] but was laid off 4 weeks ago. He  reports that he has been smoking--1 pack/week.  He does not have any smokeless tobacco history on file. He denies drinking alcohol or use of illicit drugs.    Allergies  Allergen Reactions  . Ibuprofen Other (See Comments)    Nose bleeds    Medications Prior to Admission  Medication Sig Dispense Refill  . buPROPion (WELLBUTRIN XL) 150 MG 24 hr tablet Take 1 tablet (150 mg total) by mouth daily. 30 tablet 1  . gabapentin (NEURONTIN) 100 MG capsule Take 1 capsule (100 mg total) by mouth 3 (three) times daily. 90 capsule 0  . acetaminophen (TYLENOL) 325 MG tablet Take 2 tablets (650 mg total) by mouth every 6 (six) hours as needed for moderate pain or headache. (Patient not taking: Reported on 05/24/2015) 30 tablet 3  . docusate sodium (COLACE) 100 MG capsule Take 1 capsule (100 mg total)  by mouth 2 (two) times daily. (Patient not taking: Reported on 05/24/2015) 30 capsule 3  . oxyCODONE-acetaminophen (PERCOCET/ROXICET) 5-325 MG per tablet Take 1-2 tablets by mouth every 4 (four) hours as needed for moderate pain or severe pain. (Patient not taking: Reported on 05/24/2015) 30 tablet 0  . polyethylene glycol (MIRALAX / GLYCOLAX) packet Take 17 g by mouth daily as needed for moderate constipation. 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  Slow, with much time needed, but no assist needed Transfers Overall transfer level: Needs assistance Equipment used: Rolling walker (2 wheeled) Transfers: Sit to/from Stand, Stand Pivot Transfers Sit to Stand: Mod assist (+2 for safety ) Stand pivot transfers: Mod assist, +2 safety/equipment General transfer comment: posterior LOB upon standing ,  Stability assist during pivot with relativiely locked knees Ambulation/Gait General Gait Details: not appropriate today    ADL:    Cognition: Cognition Overall Cognitive Status: Within Functional Limits for tasks assessed Orientation Level: Oriented X4 Cognition Arousal/Alertness: Awake/alert Behavior During Therapy: Flat affect, WFL for tasks assessed/performed Overall Cognitive Status: Within Functional Limits for tasks assessed   Blood pressure 147/84, pulse 66, temperature 97.5 F (36.4 C), temperature source Oral, resp. rate 17, height 6\' 1"  (1.854 m), weight 136.5 kg (300 lb 14.9 oz), SpO2 99 %. Physical Exam  Constitutional: He is oriented to person, place, and time. He appears well-developed and well-nourished.  HENT:  Head: Normocephalic and atraumatic.  Eyes: Conjunctivae  are normal. Pupils are equal, round, and reactive to light.  Neck: Normal range of motion. Neck supple.  Cardiovascular: Normal rate and regular rhythm.   No murmur heard. Respiratory: Effort normal and breath sounds normal. No respiratory distress. He has no wheezes. He exhibits no tenderness.  GI: Soft. Bowel sounds are normal. He exhibits no distension. There is no tenderness. There is no rebound.  Musculoskeletal: He exhibits no edema or tenderness.  Neurological: He is alert and oriented to person, place, and time.  Speaks in soft and slow voice. He kept eyes closed for most of exam and made minimal eye contact. Able to follow commands without difficulty. Poor effort in MMT.   Skin: Skin is warm and dry. No rash noted. No erythema.  Psychiatric: His affect is blunt. He is slowed and withdrawn. Cognition and memory are not impaired. He exhibits a depressed mood.  Sensation reduced in the feet to light touch, intact in the right upper extremity but reduced in the left upper extremity. Manual muscle testing at least 4/5 in bilateral upper and lower limbs give way weakness while testing quadriceps as well as ankle dorsiflexors in both lower limbs Tone is normal bilateral upper and lower limbs.  Cerebellar exam shows no evidence of ataxia on finger nose finger or heel to shin testing   Cranial nerves II- Visual fields are intact to confrontation testing, no blurring of vision III-  no evidence of ptosis, upward, downward and medial gaze intact IV- no vertical diplopia or head tilt V- no facial numbness or masseter weakness VI- no pupil abduction weakness VII- no facial droop, good lid closure VII- normal auditory acuity IX- Low volume voice X- Low Volume voice but no evidence of dysphonia XI- no trap or SCM weakness XII- no glossal weakness    No results found for this or any previous visit (from the past 24 hour(s)). No results found.  Assessment/Plan: Diagnosis: Generalized mild  weakness with subjective complaints of lower extremity weakness and numbness. Negative workup for neuromuscular disease thus far. No evidence of cervical or thoracic spine disorders.Would recommend further psychological evaluation, possible conversion disorder. 1. Does the need for close, 24 hr/day medical supervision in concert with the patient's rehab needs make it unreasonable for this patient to be served in a less intensive setting? No 2. Co-Morbidities requiring supervision/potential complications: Depression 3. Due to NA, does the patient require 24 hr/day rehab nursing? No 4. Does the patient require coordinated care of a physician, rehab nurse, PT to address physical and functional deficits in the context of the above medical diagnosis(es)? No Addressing deficits in the following areas: locomotion, strength and transferring 5. Can the patient actively participate in an intensive therapy program of at least 3 hrs of therapy per day at least 5 days per week? N/A 6. The potential for patient to make measurable gains while on inpatient rehab is NA 7. Anticipated functional outcomes upon discharge from inpatient rehab are n/a  with PT, n/a with OT, n/a with SLP. 8. Estimated rehab length of stay to reach the above functional goals is: NA 9. Does the patient have adequate social supports and living environment to accommodate these discharge functional goals? Potentially 10. Anticipated D/C setting: Home 11. Anticipated post D/C treatments: HH therapy 12. Overall Rehab/Functional Prognosis: good  RECOMMENDATIONS: This patient's condition is appropriate for continued rehabilitative care in the following setting: [facility] Therapy Patient has agreed to participate in recommended program. Potentially Note that insurance prior authorization may be required for reimbursement for recommended care.  Comment: Consider lumbar MRI to complete workup, Rehabilitation RN will follow until workup  completed    05/27/2015

## 2015-05-28 DIAGNOSIS — F418 Other specified anxiety disorders: Secondary | ICD-10-CM

## 2015-05-28 DIAGNOSIS — E538 Deficiency of other specified B group vitamins: Secondary | ICD-10-CM

## 2015-05-28 LAB — CBC
HEMATOCRIT: 37.8 % — AB (ref 39.0–52.0)
HEMOGLOBIN: 13.3 g/dL (ref 13.0–17.0)
MCH: 30.7 pg (ref 26.0–34.0)
MCHC: 35.2 g/dL (ref 30.0–36.0)
MCV: 87.3 fL (ref 78.0–100.0)
Platelets: 245 10*3/uL (ref 150–400)
RBC: 4.33 MIL/uL (ref 4.22–5.81)
RDW: 13.8 % (ref 11.5–15.5)
WBC: 5.1 10*3/uL (ref 4.0–10.5)

## 2015-05-28 LAB — BASIC METABOLIC PANEL
Anion gap: 7 (ref 5–15)
BUN: 9 mg/dL (ref 6–20)
CHLORIDE: 103 mmol/L (ref 101–111)
CO2: 26 mmol/L (ref 22–32)
CREATININE: 0.93 mg/dL (ref 0.61–1.24)
Calcium: 9.2 mg/dL (ref 8.9–10.3)
GFR calc Af Amer: >60 mL/min (ref >60)
GFR calc non Af Amer: >60 mL/min (ref >60)
GLUCOSE: 86 mg/dL (ref 65–99)
POTASSIUM: 4.3 mmol/L (ref 3.5–5.1)
Sodium: 136 mmol/L (ref 135–145)

## 2015-05-28 LAB — ANTINUCLEAR ANTIBODIES, IFA: ANTINUCLEAR ANTIBODIES, IFA: NEGATIVE

## 2015-05-28 LAB — MAGNESIUM: Magnesium: 2.1 mg/dL (ref 1.7–2.4)

## 2015-05-28 MED ORDER — VITAMIN B-12 1000 MCG PO TABS
1000.0000 ug | ORAL_TABLET | Freq: Every day | ORAL | Status: DC
  Administered 2015-05-28 – 2015-05-29 (×2): 1000 ug via ORAL
  Filled 2015-05-28 (×2): qty 1

## 2015-05-28 MED ORDER — VALPROATE SODIUM 500 MG/5ML IV SOLN
500.0000 mg | Freq: Two times a day (BID) | INTRAVENOUS | Status: AC
  Administered 2015-05-28 – 2015-05-29 (×2): 500 mg via INTRAVENOUS
  Filled 2015-05-28 (×3): qty 5

## 2015-05-28 MED ORDER — BUPROPION HCL ER (XL) 150 MG PO TB24
150.0000 mg | ORAL_TABLET | Freq: Every day | ORAL | Status: DC
  Administered 2015-05-28 – 2015-05-29 (×2): 150 mg via ORAL
  Filled 2015-05-28 (×3): qty 1

## 2015-05-28 MED ORDER — GABAPENTIN 100 MG PO CAPS
100.0000 mg | ORAL_CAPSULE | Freq: Three times a day (TID) | ORAL | Status: DC
  Administered 2015-05-28 – 2015-05-29 (×5): 100 mg via ORAL
  Filled 2015-05-28 (×5): qty 1

## 2015-05-28 NOTE — Progress Notes (Addendum)
[facility] PROGRESS NOTE   Assessment/Plan: Guillain-Barre disease: -Appreciate neurology assistance; NIF improved and patient with good resp effort. Neurology recommended no further IVIG or PLEX; recommended outpatient EMG/NCS as an outpatient -ANA and myasthenia panel negative -HIV neg -continue PT eval . -B12 borderline normal; will replete   HA's: has failed to improved with tylenol and vicodin -will continue minimizing narcotics due to rebound effect -will repeat depacon IV q12h for X 2 doses; patient with improvement to HA  Anxiety/depression: -will resume Wellbutrin   Code Status: full Family Communication: none  Disposition Plan: home with home health services in the next 24-48 hours.   Consultants:  Neuro  Inpatient Rehab  Procedures:  MRI  Antibiotics:  none  HPI/Subjective: Still complaining of weakness and with some HA's, even last one has improved with use of depacon.   Objective: Filed Vitals:   05/27/15 1742 05/27/15 2202 05/28/15 0458 05/28/15 1428  BP: 137/75 153/78 138/75 138/76  Pulse: 78 80 62 79  Temp: 98.6 F (37 C) 98.5 F (36.9 C) 97.9 F (36.6 C) 99.3 F (37.4 C)  TempSrc: Oral Oral Oral Oral  Resp: Height:      Weight:      SpO2: 97% 98% 98% 98%    Intake/Output Summary (Last 24 hours) at 05/28/15 1549 Last data filed at 05/28/15 1300  Gross per 24 hour  Intake 1852.5 ml  Output   2000 ml  Net -147.5 ml   Filed Weights   05/24/15 1742  Weight: 136.5 kg (300 lb 14.9 oz)    Exam:  General: Alert, awake, oriented x3, still complaining of HA, but improved; no CP or SOB. Patient with flat affect  And remained complaining of weakness HEENT: No bruits, no goiter, no JVD Heart: Regular rate and rhythm; no murmur, rubs or gallops Lungs: Good air movement, CTA bilaterally  Abdomen: Soft, nontender, nondistended, positive bowel sounds.  Neuro: b/l lower ext weakness and upper extremity weakness (Lower  > upper).   Data Reviewed: Basic Metabolic Panel:  Recent Labs Lab 05/24/15 1553 05/24/15 1905 05/25/15 0243 05/28/15 0754  NA 139  --  136 136  K 3.6  --  3.8 4.3  CL 106  --  104 103  CO2  --   --  25 26  GLUCOSE 85  --  82 86  BUN 5*  --  <5* 9  CREATININE 0.90 0.88 0.82 0.93  CALCIUM  --   --  8.8* 9.2  MG  --  1.6*  --  2.1  PHOS  --  3.2  --   --    CBC:  Recent Labs Lab 05/24/15 1544 05/24/15 1553 05/24/15 1905 05/25/15 0243 05/28/15 0754  WBC 8.1  --  8.2 6.7 5.1  NEUTROABS 5.1  --   --   --   --   HGB 13.5 12.9* 12.9* 12.7* 13.3  HCT 38.3* 38.0* 37.2* 36.9* 37.8*  MCV 87.6  --  87.5 87.6 87.3  PLT 263  --  242 261 245   Cardiac Enzymes:  Recent Labs Lab 05/25/15 1515  CKTOTAL 45*   CBG: No results for input(s): GLUCAP in the last 168 hours.  Recent Results (from the past 240 hour(s))  MRSA PCR Screening     Status: None   Collection Time: 05/24/15  7:02 PM  Result Value Ref Range Status   MRSA by PCR NEGATIVE NEGATIVE Final    Comment:  The GeneXpert MRSA Assay (FDA approved for NASAL specimens only), is one component of a comprehensive MRSA colonization surveillance program. It is not intended to diagnose MRSA infection nor to guide or monitor treatment for MRSA infections.      Studies: No results found.  Scheduled Meds: . buPROPion  150 mg Oral Daily  . enoxaparin (LOVENOX) injection  40 mg Subcutaneous Q24H  . gabapentin  100 mg Oral TID  . LORazepam  0.5 mg Oral QHS  . valproate sodium  500 mg Intravenous Q12H  . vitamin B-12  1,000 mcg Oral Daily   Continuous Infusions: . sodium chloride 50 mL/hr (05/28/15 1511)    Time Spent: 25 min   Vassie Loll  [facility] Pager [phone]. If 7PM-7AM, please contact night-coverage at [email or web address], password [email or web address] 05/28/2015, 3:49 PM  LOS: 4 days

## 2015-05-28 NOTE — Progress Notes (Addendum)
NIF -38,  VC 1.9L   Good pt effort

## 2015-05-28 NOTE — Procedures (Signed)
NIF= - 42 ,VC = 3.2 L

## 2015-05-29 DIAGNOSIS — F329 Major depressive disorder, single episode, unspecified: Secondary | ICD-10-CM

## 2015-05-29 MED ORDER — OXYCODONE-ACETAMINOPHEN 5-325 MG PO TABS
1.0000 | ORAL_TABLET | Freq: Three times a day (TID) | ORAL | Status: DC | PRN
  Administered 2015-05-29 – 2015-05-30 (×2): 2 via ORAL
  Filled 2015-05-29 (×2): qty 2

## 2015-05-29 NOTE — Progress Notes (Signed)
[facility] PROGRESS NOTE   Assessment/Plan: Guillain-Barre disease: -Appreciate neurology assistance; NIF improved and patient with good resp effort. Neurology recommended no further IVIG or PLEX; recommended outpatient EMG/NCS vs academic facility (like [facility] for further evaluation and treatment and potential nerve conduction studies as inpatient)  -ANA and myasthenia panel negative -HIV neg -continue PT eval . -B12 borderline normal; will initiate repletion -mother endorses that prior his first admission, patient experienced what they thought as viral gastroenteritis, lasted for approx 5 days (?? Campylobacter infection; they never seek medical attention at that time)  -[facility] is reviewing patient case/bed availability for transfer -will follow and determined course on 05/30/15 -continue neurontin  HA's: has failed to improved with tylenol and vicodin -will continue minimizing narcotics due to rebound effect -Gaylyn Rong is now resolved  Anxiety/depression: -will continue Wellbutrin   Code Status: full Family Communication: none  Disposition Plan: waiting for [facility] to respond regarding transferring availability; vs home with [facility] services. Final decision in 24-48 hours   Consultants:  Neuro  Inpatient Rehab  Procedures:  MRI  Antibiotics:  none  HPI/Subjective: No complaints of HA's. Still feeling weak  Objective: Filed Vitals:   05/28/15 0458 05/28/15 1428 05/28/15 2113 05/29/15 0514  BP: 138/75 138/76 146/67 137/77  Pulse: 62 79 82 64  Temp: 97.9 F (36.6 C) 99.3 F (37.4 C) 98.5 F (36.9 C) 97.5 F (36.4 C)  TempSrc: Oral Oral Oral Oral  Resp: 18 20 18 20   Height:      Weight:      SpO2: 98% 98% 99% 99%    Intake/Output Summary (Last 24 hours) at 05/29/15 1226 Last data filed at 05/29/15 0728  Gross per 24 hour  Intake 2030.42 ml  Output      0 ml  Net 2030.42 ml   Filed Weights   05/24/15 1742  Weight: 136.5 kg (300 lb 14.9 oz)     Exam:  General: Alert, awake, oriented x3, still complaining of weakness. No SOB and no HA's today HEENT: No bruits, no goiter, no JVD Heart: Regular rate and rhythm; no murmur, rubs or gallops Lungs: Good air movement, CTA bilaterally  Abdomen: Soft, nontender, nondistended, positive bowel sounds.  Neuro: b/l lower ext weakness and upper extremity weakness (Lower > upper). MS 2-3/5 LE and 4/5 upper extremities.   Data Reviewed: Basic Metabolic Panel:  Recent Labs Lab 05/24/15 1553 05/24/15 1905 05/25/15 0243 05/28/15 0754  NA 139  --  136 136  K 3.6  --  3.8 4.3  CL 106  --  104 103  CO2  --   --  25 26  GLUCOSE 85  --  82 86  BUN 5*  --  <5* 9  CREATININE 0.90 0.88 0.82 0.93  CALCIUM  --   --  8.8* 9.2  MG  --  1.6*  --  2.1  PHOS  --  3.2  --   --    CBC:  Recent Labs Lab 05/24/15 1544 05/24/15 1553 05/24/15 1905 05/25/15 0243 05/28/15 0754  WBC 8.1  --  8.2 6.7 5.1  NEUTROABS 5.1  --   --   --   --   HGB 13.5 12.9* 12.9* 12.7* 13.3  HCT 38.3* 38.0* 37.2* 36.9* 37.8*  MCV 87.6  --  87.5 87.6 87.3  PLT 263  --  242 261 245   Cardiac Enzymes:  Recent Labs Lab 05/25/15 1515  CKTOTAL 45*   CBG: No results for input(s): GLUCAP in  the last 168 hours.  Recent Results (from the past 240 hour(s))  MRSA PCR Screening     Status: None   Collection Time: 05/24/15  7:02 PM  Result Value Ref Range Status   MRSA by PCR NEGATIVE NEGATIVE Final    Comment:        The GeneXpert MRSA Assay (FDA approved for NASAL specimens only), is one component of a comprehensive MRSA colonization surveillance program. It is not intended to diagnose MRSA infection nor to guide or monitor treatment for MRSA infections.      Studies: No results found.  Scheduled Meds: . buPROPion  150 mg Oral Daily  . enoxaparin (LOVENOX) injection  40 mg Subcutaneous Q24H  . gabapentin  100 mg Oral TID  . LORazepam  0.5 mg Oral QHS  . vitamin B-12  1,000 mcg Oral Daily    Continuous Infusions: . sodium chloride 50 mL/hr at 05/29/15 1206    Time Spent: 25 min   Vassie Loll  [facility] Pager [phone]. If 7PM-7AM, please contact night-coverage at [email or web address], password [facility] 05/29/2015, 12:26 PM  LOS: 5 days

## 2015-05-29 NOTE — Care Management Note (Addendum)
Case Management Note  Patient Details  Name: Donald Mckay. MRN: [id number] Date of Birth: 21-Jun-1991  Subjective/Objective:      Per MD Patient may possibly transfer to [facility] tomorrow, if he does not then he will go home with [facility] and Child psychotherapist.  Patient states he will need a w/chair, he lives with his mom.  NCM made referral to [facility] for w/chair and HHRN/social worker for charity. Patient not able to get HHPT.  Stephanie notified.  Soc will begin 24-48 hrs post dc.  Patient will need ambulance transport home if he goes home and not to [facility].    CSW aware.            Action/Plan:   Expected Discharge Date:                  Expected Discharge Plan:  Home w Home Health Services  In-House Referral:  Clinical Social Work  Discharge planning Services  CM Consult  Post Acute Care Choice:  Home Health Choice offered to:  Patient  DME Arranged:  Government social research officer DME Agency:  [facility]  HH Arranged:  Charity fundraiser (Child psychotherapist) HH Agency:  [facility]  Status of Service:  Completed, signed off  Medicare Important Message Given:    Date Medicare IM Given:    Medicare IM give by:    Date Additional Medicare IM Given:    Additional Medicare Important Message give by:     If discussed at Long Length of Stay Meetings, dates discussed:    Additional Comments:  Leone Haven, RN 05/29/2015, 3:26 PM

## 2015-05-29 NOTE — Progress Notes (Signed)
Physical Therapy Treatment Patient Details Name: Donald Attia Duggar Jr. MRN: [id number] DOB: 1991/02/29 Today's Date: 05/29/2015    History of Present Illness 24 y.o. male with recent diagnosis of GBS treated with IVIG x 5 days at [facility] and was discharged on 05/18/15, he now presented back to [facility] ED with main concern of persistent weakness in LE and headache and was unable to ambulate in the past 24 hours and was associated with numbness and loss of sensation in the lower extremities    PT Comments    Progressing towards goals. Demonstrates poor effort with exercises and requires encouragement to participate. Functional ability (ambulatory today) not consistent with presentation of LE weakness. Discussed importance of exercise compliance, working with PT, and being OOB as tolerated. Will continue to follow and progress.  Follow Up Recommendations  Home health PT (Denied by CIR)     Equipment Recommendations  Wheelchair (measurements PT);Rolling walker with 5" wheels    Recommendations for Other Services       Precautions / Restrictions Precautions Precautions: Fall Restrictions Weight Bearing Restrictions: No    Mobility  Bed Mobility Overal bed mobility: Modified Independent Bed Mobility: Supine to Sit     Supine to sit: Modified independent (Device/Increase time)     General bed mobility comments: Extra time  Transfers Overall transfer level: Needs assistance Equipment used: Rolling walker (2 wheeled) Transfers: Sit to/from [employer] Sit to Stand: Min assist;+2 safety/equipment Stand pivot transfers: Min assist;+2 safety/equipment       General transfer comment: Min assist for light pressure upon standing due to posterior lean. Better strength to rise today. Cues for hand placement. Still with poor control while descending into chair. Max verbal cues for sequencing with pivot. Encouraing weight shift for foot clearance with pivot to chair. Second  person available for safety.  Ambulation/Gait Ambulation/Gait assistance: Min assist;+2 safety/equipment Ambulation Distance (Feet): 8 Feet Assistive device: Rolling walker (2 wheeled) Gait Pattern/deviations: Step-to pattern;Decreased step length - right;Decreased step length - left;Decreased stride length;Decreased dorsiflexion - right;Decreased dorsiflexion - left;Shuffle Gait velocity: slow Gait velocity interpretation: <1.8 ft/sec, indicative of risk for recurrent falls General Gait Details: Min assist for balance due to posterior lean at times. No buckling noted, able to bear full weight while scratching head and standing. Required several standing rest breaks and encouragement to continue. Each step progressively bettert that previous with better foot clearance.   Stairs            Wheelchair Mobility    Modified Rankin (Stroke Patients Only)       Balance                                    Cognition Arousal/Alertness: Awake/alert Behavior During Therapy: WFL for tasks assessed/performed Overall Cognitive Status: Within Functional Limits for tasks assessed                      Exercises General Exercises - Lower Extremity Ankle Circles/Pumps: AROM;Both;Supine;5 reps Long Arc Quad: Strengthening;Both;Seated;5 reps (minimal lift BIL) Toe Raises: Strengthening;Both;5 reps Heel Raises: Strengthening;Both;5 reps;Standing Other Exercises Other Exercises: States he has not performed exercises we discussed performing on a daily basis.    General Comments General comments (skin integrity, edema, etc.): Poor effort with exercise. Strength not consistent with functional abilites of bearing weight through LEs while ambulating.      Pertinent Vitals/Pain Pain Assessment: 0-10 Pain Score:  (  No complaints at this time) Pain Intervention(s): Monitored during session    Home Living                      Prior Function            PT  Goals (current goals can now be found in the care plan section) Acute Rehab PT Goals Patient Stated Goal: Need to be back independent PT Goal Formulation: With patient Time For Goal Achievement: 06/09/15 Potential to Achieve Goals: Good Progress towards PT goals: Progressing toward goals    Frequency  Min 4X/week    PT Plan Current plan remains appropriate    Co-evaluation             End of Session Equipment Utilized During Treatment: Gait belt Activity Tolerance: Other (comment) (Self-limiting, difficult to motivate) Patient left: with call bell/phone within reach;with family/visitor present;in chair     Time: 7829-5621 PT Time Calculation (min) (ACUTE ONLY): 19 min  Charges:  $Gait Training: 8-22 mins                    G Codes:      Berton Mount 11-Jun-2015, 4:40 PM [place], [place] [phone]

## 2015-05-29 NOTE — Procedures (Signed)
NIF= - 42 , VC =3.8

## 2015-05-30 MED ORDER — CYANOCOBALAMIN 1000 MCG PO TABS: 1000.0000 ug | ORAL_TABLET | Freq: Every day | ORAL | Status: DC

## 2015-05-30 NOTE — Progress Notes (Signed)
Nurse at [facility] called with a bed available.  Notified Claiborne Billings NP and Rolland Bimler that a bed was available, Neither could write discharge summary for pt to leave tonight and stated for pt to stay tonight and the attending handle it in the morning. Charge Nurse Irving Burton at [facility] stated they could not save pt bed offer but may have another bed available once patients are discharged.

## 2015-05-30 NOTE — Progress Notes (Signed)
   05/30/15 0900  Clinical Encounter Type  Visited With Patient;Family  Visit Type Initial;Spiritual support  Referral From Patient;Nurse;Family  Spiritual Encounters  Spiritual Needs Prayer;Emotional  Pt request CH for prayer prior to transfer to [facility]; [facility] responding to RN page to meet with pt and mother and offer spiritual support. 9:02 AM Erline Levine

## 2015-05-30 NOTE — Progress Notes (Signed)
Pt. Made aware that he will be transported today to [facility] for further treatment. Pt. Has a bed available (RM: [id number]) called pt. Mother [place], Cordelia Pen) and made her aware of transfer plans as well. Called report to [place], Charity fundraiser. Pt. Signed EMTALA form and is placed in pt. Chart. Awaiting CARELINK arrival. No further needs noted at this time.

## 2015-05-30 NOTE — Discharge Summary (Signed)
Physician Discharge Summary  Tawni Carnes Bakula [name] EAV:[id number] DOB: [birth year] DOA: 05/24/2015  PCP: No PCP Per Patient  Admit date: 05/24/2015 Discharge date: 05/30/2015  Time spent: 30 minutes  Recommendations for Outpatient Follow-up:  1. Patient will be transfer to [facility] for further neurologic evaluation and potential EMG/NCS  Discharge Diagnoses:  Presumed Guillain Barr syndrome LE weakness bilaterally and numbness Depression/anxiety Low B12 level Intermittent non-intractable Headaches    Discharge Condition: VSS, no fever, no CP and no fever. Patient with good resp effort and with NIF -42 and VC 3.8. Will transfer to [facility] for further evaluation and treatment of his ongoing weakness and numbness.  Diet recommendation: regular diet   Filed Weights   05/24/15 1742  Weight: 136.5 kg (300 lb 14.9 oz)    History of present illness:  24 y.o. male with recent presumed diagnosis of GBS treated with IVIG x 5 days at [facility] and was discharged on 05/18/15, he now presented back to [facility] ED with main concern of persistent weakness in LE and was unable to ambulate in the past 24 hours and was associated with numbness and loss of sensation in the lower extremities. He also explains he thinks his upper extremities are now getting involved as well but he is able to move them. He explains he has noted some dyspnea with exertion and at rest, and difficulty with swallowing. He denies chest pain, abd or urinary problems.   Hospital Course:  Guillain-Barre disease: -Appreciate neurology assistance; NIF improved and patient with good resp effort. Neurology recommended no further IVIG or PLEX; recommended outpatient EMG/NCS vs academic facility (like [facility] for further evaluation and treatment and potential nerve conduction studies as inpatient)  -ANA and myasthenia panel negative -HIV neg -continue PT eval . -B12 borderline normal; will initiate repletion -mother endorses that  prior his first admission, patient experienced what they thought as viral gastroenteritis, lasted for approx 5 days (?? If this was Campylobacter infection; they never seek medical attention at that time)  -[facility] has accepted the patient in transfer for further evaluation and treatment -continue neurontin  HA's: has failed to improved with tylenol and vicodin -will continue minimizing narcotics due to rebound effect as recommended by Neurology service -Ha with good response to use of depacon   Anxiety/depression: -will continue Wellbutrin   Procedures:  See below for x-ray reports   Consultations:  Neurology   Discharge Exam: Filed Vitals:   05/30/15 0654  BP: 144/59  Pulse: 75  Temp: 98 F (36.7 C)  Resp: 13   General: Alert, awake, oriented x3, continue complaining of weakness. No SOB and no CP. Per patient he has continue experiencing some intermittent episodes of HA's overnight HEENT: No bruits, no goiter, no JVD Heart: Regular rate and rhythm; no murmur, rubs or gallops Lungs: Good air movement, CTA bilaterally  Abdomen: Soft, nontender, nondistended, positive bowel sounds.  Neuro: b/l lower ext weakness and upper extremity weakness (Lower > upper). MS 2-3/5 LE and 4/5 upper extremities.  Discharge Instructions    Current Discharge Medication List    START taking these medications   Details  vitamin B-12 1000 MCG tablet Take 1 tablet (1,000 mcg total) by mouth daily.      CONTINUE these medications which have NOT CHANGED   Details  buPROPion (WELLBUTRIN XL) 150 MG 24 hr tablet Take 1 tablet (150 mg total) by mouth daily. Qty: 30 tablet, Refills: 1    gabapentin (NEURONTIN) 100 MG capsule Take 1 capsule (  100 mg total) by mouth 3 (three) times daily. Qty: 90 capsule, Refills: 0    acetaminophen (TYLENOL) 325 MG tablet Take 2 tablets (650 mg total) by mouth every 6 (six) hours as needed for moderate pain or headache. Qty: 30 tablet, Refills: 3     polyethylene glycol (MIRALAX / GLYCOLAX) packet Take 17 g by mouth daily as needed for moderate constipation. Qty: 30 each, Refills: 1      STOP taking these medications     docusate sodium (COLACE) 100 MG capsule      oxyCODONE-acetaminophen (PERCOCET/ROXICET) 5-325 MG per tablet        Allergies  Allergen Reactions  . Ibuprofen Other (See Comments)    Nose bleeds   Follow-up Information    Follow up with [facility].   Why:  HHRN/social worker   Contact information:   [address] [place] [address] [phone]       Follow up with [facility].   Why:  wheelchair   Contact information:   [address] [place] [address] [phone]       Follow up with [facility] On 06/03/2015.   Specialty:  Internal Medicine   Why:  9:30 for hospital follow up  (over flow) for [facility] and wellness clinic   Contact information:   [address] [place] [address] [phone]      The results of significant diagnostics from this hospitalization (including imaging, microbiology, ancillary and laboratory) are listed below for reference.    Significant Diagnostic Studies: Dg Chest 2 View  05/24/2015   CLINICAL DATA:  Headache, weakness, shortness of breath.  EXAM: CHEST  2 VIEW  COMPARISON:  05/20/2015 radiographs and CTA.  FINDINGS: Normal sized heart. Clear lungs. Lower thoracic spine degenerative changes.  IMPRESSION: No acute abnormality.   Electronically Signed   By: Beckie Salts M.D.   On: 05/24/2015 17:14   Dg Chest 2 View  05/20/2015   CLINICAL DATA:  Headaches and chest pressure 2 weeks.  EXAM: CHEST  2 VIEW  COMPARISON:  05/12/2015  FINDINGS: Lungs are hypoinflated and otherwise clear. Cardiomediastinal silhouette and remainder the exam is unchanged.  IMPRESSION: No active cardiopulmonary disease.   Electronically Signed   By: Elberta Fortis M.D.   On: 05/20/2015 20:53    Dg Chest 2 View  05/12/2015   CLINICAL DATA:  Weakness and increased numbness of the extremities.  EXAM: CHEST  2 VIEW  COMPARISON:  01/28/2014  FINDINGS: Cardiomediastinal silhouette is normal. Mediastinal contours appear intact.  There is no evidence of focal airspace consolidation, pleural effusion or pneumothorax.  Osseous structures are without acute abnormality. Soft tissues are grossly normal.  IMPRESSION: No radiographic evidence of acute cardiopulmonary abnormality.   Electronically Signed   By: Ted Mcalpine M.D.   On: 05/12/2015 10:21   Ct Head Wo Contrast  05/11/2015   CLINICAL DATA:  Confusion, numbness, vomiting  EXAM: CT HEAD WITHOUT CONTRAST  TECHNIQUE: Contiguous axial images were obtained from the base of the skull through the vertex without intravenous contrast.  COMPARISON:  None.  FINDINGS: No skull fracture is noted. Paranasal sinuses and mastoid air cells are unremarkable. No intracranial hemorrhage, mass effect or midline shift. No acute infarction. No hydrocephalus. No mass lesion is noted on this unenhanced scan.  IMPRESSION: No acute intracranial abnormality.   Electronically Signed   By: Natasha Mead M.D.   On:  05/11/2015 17:49   Ct Angio Chest Pe W/cm &/or Wo Cm  05/20/2015   CLINICAL DATA:  Acute onset of leg swelling. Difficulty taking deep breath. Initial encounter.  EXAM: CT ANGIOGRAPHY CHEST WITH CONTRAST  TECHNIQUE: Multidetector CT imaging of the chest was performed using the standard protocol during bolus administration of intravenous contrast. Multiplanar CT image reconstructions and MIPs were obtained to evaluate the vascular anatomy. The study was repeated due to limitations in the timing of the contrast bolus on the first study.  CONTRAST:  OMNIPAQUE IOHEXOL 350 MG/ML SOLN  COMPARISON:  Chest radiograph performed earlier today at 8:19 p.m., and CTA of the chest performed 06/27/2013  FINDINGS: There is no evidence of significant pulmonary embolus.  The  lungs appear essentially clear bilaterally. There is no evidence of significant focal consolidation, pleural effusion or pneumothorax. No masses are identified; no abnormal focal contrast enhancement is seen.  Visualized mediastinal nodes remain normal in size. The mediastinum is unremarkable in appearance. No pericardial effusion is identified. The great vessels are grossly unremarkable in appearance. No axillary lymphadenopathy is seen. The visualized portions of the thyroid gland are unremarkable in appearance.  The visualized portions of the liver are unremarkable. The spleen is enlarged, measuring 16.8 cm in length. The visualized portions of the gallbladder, pancreas, adrenal glands and kidneys are within normal limits.  No acute osseous abnormalities are seen.  Review of the MIP images confirms the above findings.  IMPRESSION: 1. No evidence of significant pulmonary embolus. 2. Lungs clear bilaterally. 3. Splenomegaly noted.   Electronically Signed   By: Roanna Raider M.D.   On: 05/20/2015 21:42   Mr Cervical Spine Wo Contrast  05/13/2015   CLINICAL DATA:  Numbness in the right hand and in both lower extremities. Dizziness. Question Guillain-Barre syndrome.  EXAM: MRI CERVICAL AND thoracic SPINE WITHOUT CONTRAST  TECHNIQUE: Multiplanar and multiecho pulse sequences of the cervical spine, to include the craniocervical junction and cervicothoracic junction, and thoracic spine, were obtained without intravenous contrast.  COMPARISON:  None.  FINDINGS: MRI CERVICAL SPINE FINDINGS  Cervical exam is mildly degraded by motion. There are minimal non-compressive disc bulges at C4-5 and C5-6. No canal or foraminal stenosis. No abnormal cord signal. No osseous or articular focal lesion.  MRI thoracic SPINE FINDINGS  Thoracic study is also somewhat degraded by motion. Alignment of the spine is normal. No degenerative disc disease. No canal or foraminal stenosis. No signal abnormality of the spinal cord. The distal  cord and conus are normal with conus tip at L1.  Guillain-Barre syndrome can present with the only finding of nerve root enhancement of the cauda equina. That cannot be evaluated on this study.  IMPRESSION: Motion degraded but otherwise unremarkable studies of the cervical and thoracic spine. No compressive pathology. No finding of Guillain-Barre syndrome on this nonenhanced study. Often, the only imaging sign of this disease is enhancement of the nerve roots of the cauda equina, which cannot be evaluated on this study.   Electronically Signed   By: Paulina Fusi M.D.   On: 05/13/2015 19:57   Mr Thoracic Spine Wo Contrast  05/13/2015   CLINICAL DATA:  Numbness in the right hand and in both lower extremities. Dizziness. Question Guillain-Barre syndrome.  EXAM: MRI CERVICAL AND thoracic SPINE WITHOUT CONTRAST  TECHNIQUE: Multiplanar and multiecho pulse sequences of the cervical spine, to include the craniocervical junction and cervicothoracic junction, and thoracic spine, were obtained without intravenous contrast.  COMPARISON:  None.  FINDINGS: MRI CERVICAL  SPINE FINDINGS  Cervical exam is mildly degraded by motion. There are minimal non-compressive disc bulges at C4-5 and C5-6. No canal or foraminal stenosis. No abnormal cord signal. No osseous or articular focal lesion.  MRI thoracic SPINE FINDINGS  Thoracic study is also somewhat degraded by motion. Alignment of the spine is normal. No degenerative disc disease. No canal or foraminal stenosis. No signal abnormality of the spinal cord. The distal cord and conus are normal with conus tip at L1.  Guillain-Barre syndrome can present with the only finding of nerve root enhancement of the cauda equina. That cannot be evaluated on this study.  IMPRESSION: Motion degraded but otherwise unremarkable studies of the cervical and thoracic spine. No compressive pathology. No finding of Guillain-Barre syndrome on this nonenhanced study. Often, the only imaging sign of this  disease is enhancement of the nerve roots of the cauda equina, which cannot be evaluated on this study.   Electronically Signed   By: Paulina Fusi M.D.   On: 05/13/2015 19:57   Mr Cervical Spine W Wo Contrast  05/24/2015   CLINICAL DATA:  Recent diagnosis of Guillain-Barre syndrome, treated with IV IG for 5 days, discharged May 18, 2015. Shortness of breath and leg swelling, now with lower extremity paresthesias, bowel incontinence.  EXAM: MRI CERVICAL AND THORACIC SPINE WITHOUT AND WITH CONTRAST  TECHNIQUE: Multiplanar and multiecho pulse sequences of the cervical spine, to include the craniocervical junction and cervicothoracic junction, and thoracic spine, were obtained without and with intravenous contrast.  CONTRAST:  20mL MULTIHANCE GADOBENATE DIMEGLUMINE 529 MG/ML IV SOLN  COMPARISON:  MRI of the cervical and thoracic spine May 13, 2015.  FINDINGS: Large body habitus results in overall decreased signal to noise ratio.  MRI CERVICAL SPINE FINDINGS  Multiple sequences are motion degraded, moderate to severely motion degraded sagittal T2 and STIR sequence.  Cervical vertebral bodies and posterior elements are intact and aligned with straightened cervical lordosis. Intervertebral discs demonstrate normal morphology and signal characteristics. No abnormal osseous or intradiscal signal nor enhancement.  Normal appearance of the cervical spinal cord given the degree of motion. No abnormal cord, leptomeningeal or epidural enhancement. Included prevertebral and paraspinal soft tissues are normal.  Due to motion, limited level by level evaluation. At C4-5 there is a small central disc protrusion, effacing the ventral thecal sac. Possible mild neural foraminal narrowing.  MRI THORACIC SPINE FINDINGS  Multiple sequences are motion degraded; moderately motion degraded STIR sequence. Thoracic vertebral bodies appear intact and aligned with maintenance of thoracic kyphosis. Intervertebral discs demonstrate normal  morphology and signal characteristics. No abnormal osseous signal. No abnormal osseous or intradiscal enhancement.  Limited assessment of thoracic spinal cord due to motion, without definite signal abnormality or syrinx. No abnormal cord, leptomeningeal or epidural enhancement. Included prevertebral and paraspinal soft tissues are normal.  No significant disc bulge, canal stenosis or neural foraminal narrowing giving motion degraded examination.  IMPRESSION: Similarly motion and habitus degraded MRI of the cervical and thoracic spine without acute process, specifically no abnormal enhancement. Limited assessment of cord signal abnormality.  No acute fracture, malalignment or significant neurocompressive changes. Possible mild C4-5 neural foraminal narrowing.   Electronically Signed   By: Awilda Metro M.D.   On: 05/24/2015 22:23   Mr Thoracic Spine W Wo Contrast  05/24/2015   CLINICAL DATA:  Recent diagnosis of Guillain-Barre syndrome, treated with IV IG for 5 days, discharged May 18, 2015. Shortness of breath and leg swelling, now with lower extremity paresthesias, bowel incontinence.  EXAM: MRI CERVICAL AND THORACIC SPINE WITHOUT AND WITH CONTRAST  TECHNIQUE: Multiplanar and multiecho pulse sequences of the cervical spine, to include the craniocervical junction and cervicothoracic junction, and thoracic spine, were obtained without and with intravenous contrast.  CONTRAST:  20mL MULTIHANCE GADOBENATE DIMEGLUMINE 529 MG/ML IV SOLN  COMPARISON:  MRI of the cervical and thoracic spine May 13, 2015.  FINDINGS: Large body habitus results in overall decreased signal to noise ratio.  MRI CERVICAL SPINE FINDINGS  Multiple sequences are motion degraded, moderate to severely motion degraded sagittal T2 and STIR sequence.  Cervical vertebral bodies and posterior elements are intact and aligned with straightened cervical lordosis. Intervertebral discs demonstrate normal morphology and signal characteristics. No  abnormal osseous or intradiscal signal nor enhancement.  Normal appearance of the cervical spinal cord given the degree of motion. No abnormal cord, leptomeningeal or epidural enhancement. Included prevertebral and paraspinal soft tissues are normal.  Due to motion, limited level by level evaluation. At C4-5 there is a small central disc protrusion, effacing the ventral thecal sac. Possible mild neural foraminal narrowing.  MRI THORACIC SPINE FINDINGS  Multiple sequences are motion degraded; moderately motion degraded STIR sequence. Thoracic vertebral bodies appear intact and aligned with maintenance of thoracic kyphosis. Intervertebral discs demonstrate normal morphology and signal characteristics. No abnormal osseous signal. No abnormal osseous or intradiscal enhancement.  Limited assessment of thoracic spinal cord due to motion, without definite signal abnormality or syrinx. No abnormal cord, leptomeningeal or epidural enhancement. Included prevertebral and paraspinal soft tissues are normal.  No significant disc bulge, canal stenosis or neural foraminal narrowing giving motion degraded examination.  IMPRESSION: Similarly motion and habitus degraded MRI of the cervical and thoracic spine without acute process, specifically no abnormal enhancement. Limited assessment of cord signal abnormality.  No acute fracture, malalignment or significant neurocompressive changes. Possible mild C4-5 neural foraminal narrowing.   Electronically Signed   By: Awilda Metro M.D.   On: 05/24/2015 22:23   Dg Fluoro Guide Lumbar Puncture  05/12/2015   CLINICAL DATA:  Bilateral leg and arm weakness.  Initial encounter  EXAM: DIAGNOSTIC LUMBAR PUNCTURE UNDER FLUOROSCOPIC GUIDANCE  FLUOROSCOPY TIME:  Radiation Exposure Index (as provided by the fluoroscopic device):  If the device does not provide the exposure index:  Fluoroscopy Time (in minutes and seconds):  1 minutes 7 seconds  Number of Acquired Images:  2  PROCEDURE:  Informed consent was obtained from the patient prior to the procedure, including potential complications of headache, allergy, and pain. With the patient prone, the lower back was prepped with Betadine. 1% Lidocaine was used for local anesthesia. Lumbar puncture was performed at the L5-S1 level using a 22 gauge needle with return of clear CSF with an opening pressure of 10 cm water. 7.5 ml of CSF were obtained for laboratory studies. The patient tolerated the procedure well and there were no apparent complications.  IMPRESSION: Successful lumbar puncture.   Electronically Signed   By: Genevive Bi M.D.   On: 05/12/2015 12:02    Microbiology: Recent Results (from the past 240 hour(s))  MRSA PCR Screening     Status: None   Collection Time: 05/24/15  7:02 PM  Result Value Ref Range Status   MRSA by PCR NEGATIVE NEGATIVE Final    Comment:        The GeneXpert MRSA Assay (FDA approved for NASAL specimens only), is one component of a comprehensive MRSA colonization surveillance program. It is not intended to diagnose MRSA infection nor to guide  or monitor treatment for MRSA infections.      Labs: Basic Metabolic Panel:  Recent Labs Lab 05/24/15 1553 05/24/15 1905 05/25/15 0243 05/28/15 0754  NA 139  --  136 136  K 3.6  --  3.8 4.3  CL 106  --  104 103  CO2  --   --  25 26  GLUCOSE 85  --  82 86  BUN 5*  --  <5* 9  CREATININE 0.90 0.88 0.82 0.93  CALCIUM  --   --  8.8* 9.2  MG  --  1.6*  --  2.1  PHOS  --  3.2  --   --    CBC:  Recent Labs Lab 05/24/15 1544 05/24/15 1553 05/24/15 1905 05/25/15 0243 05/28/15 0754  WBC 8.1  --  8.2 6.7 5.1  NEUTROABS 5.1  --   --   --   --   HGB 13.5 12.9* 12.9* 12.7* 13.3  HCT 38.3* 38.0* 37.2* 36.9* 37.8*  MCV 87.6  --  87.5 87.6 87.3  PLT 263  --  242 261 245   Cardiac Enzymes:  Recent Labs Lab 05/25/15 1515  CKTOTAL 45*    Signed:  Vassie Loll  [facility] 05/30/2015, 7:36 AM

## 2015-06-03 ENCOUNTER — Ambulatory Visit: Payer: Self-pay | Admitting: Family Medicine

## 2015-06-07 LAB — FUNGUS CULTURE W SMEAR: Fungal Smear: NONE SEEN

## 2015-06-26 ENCOUNTER — Ambulatory Visit: Payer: MEDICAID | Attending: Internal Medicine

## 2015-06-26 ENCOUNTER — Other Ambulatory Visit: Payer: Self-pay

## 2015-06-26 ENCOUNTER — Telehealth: Payer: Self-pay

## 2015-06-26 MED ORDER — TRUEPLUS LANCETS 28G MISC
1.0000 | Freq: Every morning | Status: DC
Start: 2015-06-26 — End: 2016-05-07

## 2015-06-26 MED ORDER — TRUE METRIX METER W/DEVICE KIT: 1.0000 | PACK | Freq: Every morning | Status: DC

## 2015-06-26 MED ORDER — TRUE METRIX METER W/DEVICE KIT
1.0000 | PACK | Freq: Every morning | Status: DC
Start: 2015-06-26 — End: 2015-11-21

## 2015-06-26 MED ORDER — GLUCOSE BLOOD VI STRP: ORAL_STRIP | Status: DC

## 2015-06-26 MED ORDER — TRUEPLUS LANCETS 28G MISC: 1.0000 | Freq: Every morning | Status: DC

## 2015-06-26 NOTE — Telephone Encounter (Signed)
Patient was seen at [facility]. Patient will be seeing Dr. Venetia Night in 2 weeks. Patient needs glucometer and supplies to check sugar until he can come in. Per patient, he was instructed to check sugar once daily.  Nurse spoke with Dr. Hyman Hopes, Dr. Hyman Hopes approved nurse sending prescription to pharmacy for glucometer and supplies. Prescriptions sent.

## 2015-07-04 ENCOUNTER — Encounter (HOSPITAL_COMMUNITY): Payer: Self-pay | Admitting: Emergency Medicine

## 2015-07-04 ENCOUNTER — Emergency Department (HOSPITAL_COMMUNITY): Payer: Medicaid Other

## 2015-07-04 ENCOUNTER — Emergency Department (HOSPITAL_COMMUNITY)
Admission: EM | Admit: 2015-07-04 | Discharge: 2015-07-04 | Disposition: A | Discharge status: Home / Self Care | Payer: Medicaid Other | Attending: Emergency Medicine | Admitting: Emergency Medicine

## 2015-07-04 DIAGNOSIS — R0789 Other chest pain: Secondary | ICD-10-CM | POA: Insufficient documentation

## 2015-07-04 DIAGNOSIS — Z9119 Patient's noncompliance with other medical treatment and regimen: Secondary | ICD-10-CM | POA: Insufficient documentation

## 2015-07-04 DIAGNOSIS — M545 Low back pain: Secondary | ICD-10-CM | POA: Diagnosis not present

## 2015-07-04 DIAGNOSIS — R51 Headache: Secondary | ICD-10-CM | POA: Diagnosis not present

## 2015-07-04 DIAGNOSIS — F329 Major depressive disorder, single episode, unspecified: Secondary | ICD-10-CM | POA: Diagnosis not present

## 2015-07-04 DIAGNOSIS — M25519 Pain in unspecified shoulder: Secondary | ICD-10-CM | POA: Diagnosis not present

## 2015-07-04 DIAGNOSIS — Z720 Tobacco use: Secondary | ICD-10-CM | POA: Insufficient documentation

## 2015-07-04 DIAGNOSIS — Z79899 Other long term (current) drug therapy: Secondary | ICD-10-CM | POA: Diagnosis not present

## 2015-07-04 DIAGNOSIS — R519 Headache, unspecified: Secondary | ICD-10-CM

## 2015-07-04 DIAGNOSIS — F449 Dissociative and conversion disorder, unspecified: Secondary | ICD-10-CM | POA: Diagnosis not present

## 2015-07-04 DIAGNOSIS — R079 Chest pain, unspecified: Secondary | ICD-10-CM | POA: Diagnosis present

## 2015-07-04 DIAGNOSIS — R52 Pain, unspecified: Secondary | ICD-10-CM

## 2015-07-04 LAB — BASIC METABOLIC PANEL
Anion gap: 6 (ref 5–15)
BUN: 10 mg/dL (ref 6–20)
CALCIUM: 9.1 mg/dL (ref 8.9–10.3)
CO2: 22 mmol/L (ref 22–32)
CREATININE: 0.81 mg/dL (ref 0.61–1.24)
Chloride: 109 mmol/L (ref 101–111)
Glucose, Bld: 89 mg/dL (ref 65–99)
Potassium: 3.9 mmol/L (ref 3.5–5.1)
SODIUM: 137 mmol/L (ref 135–145)

## 2015-07-04 LAB — CBC
HCT: 44.4 % (ref 39.0–52.0)
Hemoglobin: 15.5 g/dL (ref 13.0–17.0)
MCH: 31.8 pg (ref 26.0–34.0)
MCHC: 34.9 g/dL (ref 30.0–36.0)
MCV: 91 fL (ref 78.0–100.0)
PLATELETS: 262 10*3/uL (ref 150–400)
RBC: 4.88 MIL/uL (ref 4.22–5.81)
RDW: 13.6 % (ref 11.5–15.5)
WBC: 6.4 10*3/uL (ref 4.0–10.5)

## 2015-07-04 LAB — I-STAT TROPONIN, ED: TROPONIN I, POC: 0 ng/mL (ref 0.00–0.08)

## 2015-07-04 MED ORDER — ACETAMINOPHEN 325 MG PO TABS
650.0000 mg | ORAL_TABLET | Freq: Once | ORAL | Status: AC
  Administered 2015-07-04: 650 mg via ORAL
  Filled 2015-07-04: qty 2

## 2015-07-04 NOTE — ED Provider Notes (Signed)
CSN: [id number]     Arrival date & time 07/04/15  1428 History   First MD Initiated Contact with Patient 07/04/15 1500     Chief Complaint  Patient presents with  . chest discomfort   . Shoulder Pain  . Back Pain     (Consider location/radiation/quality/duration/timing/severity/associated sxs/prior Treatment) Patient is a 24 y.o. male presenting with shoulder pain and back pain. The history is provided by the patient.  Shoulder Pain Associated symptoms: back pain   Associated symptoms: no fever and no neck pain   Back Pain Associated symptoms: chest pain and headaches   Associated symptoms: no abdominal pain, no fever, no numbness and no weakness   Patient w hx conversion disorder, presents c/o diffuse headache, anterior chest pain, and 'entire' back pain for the past several days. Symptoms gradual onset/constant. Pt is a poor/difficult historian. States his entire head, chest and back hurt. Constant. Dull. No specific exacerbating or alleviating factors. Denies cough or uri c/o. Denies chest wall injury or strain. No exertional change in symptoms. No sob, nv or diaphoresis. Headache similar to prior. No eye pain or change in vision. No change in speech. No numbness/weakness, or loss of normal functional ability. No neck pain or stiffness. No head injury. Denies back injury. Diffuse back pain. No radicular pain. Denies fever or chills.       Past Medical History  Diagnosis Date  . Medical history non-contributory   . Neuromuscular disorder (HCC)     GBS  . Depression 2012    History of self injury.   Past Surgical History  Procedure Laterality Date  . Nasal cauterization     Family History  Problem Relation Age of Onset  . Cancer Mother   . Stroke Maternal Grandmother    Social History  Substance Use Topics  . Smoking status: Current Every Day Smoker  . Smokeless tobacco: None  . Alcohol Use: No    Review of Systems  Constitutional: Negative for fever and chills.   HENT: Negative for sore throat.   Eyes: Negative for redness.  Respiratory: Negative for cough and shortness of breath.   Cardiovascular: Positive for chest pain. Negative for leg swelling.  Gastrointestinal: Negative for vomiting, abdominal pain and diarrhea.  Genitourinary: Negative for flank pain.  Musculoskeletal: Positive for back pain. Negative for neck pain and neck stiffness.  Skin: Negative for rash.  Neurological: Positive for headaches. Negative for syncope, speech difficulty, weakness and numbness.  Hematological: Does not bruise/bleed easily.  Psychiatric/Behavioral: Negative for confusion.      Allergies  Ibuprofen and Fluzone   Home Medications   Prior to Admission medications   Medication Sig Start Date End Date Taking? Authorizing Provider  acetaminophen (TYLENOL) 325 MG tablet Take 2 tablets (650 mg total) by mouth every 6 (six) hours as needed for moderate pain or headache. 05/18/15  Yes Ripudeep K Rai, MD  FLUOXETINE HCL PO Take 1 tablet by mouth 2 (two) times daily.   Yes Historical Provider, MD  Lurasidone HCl (LATUDA) 60 MG TABS Take 60 mg by mouth daily.   Yes Historical Provider, MD  polyethylene glycol (MIRALAX / GLYCOLAX) packet Take 17 g by mouth daily as needed for moderate constipation. 05/18/15  Yes Ripudeep Krystal Eaton, MD  topiramate (TOPAMAX) 50 MG tablet Take 50 mg by mouth at bedtime. 06/15/15 06/14/16 Yes Historical Provider, MD  Blood Glucose Monitoring Suppl (TRUE METRIX METER) W/DEVICE KIT 1 each by Does not apply route every morning. Check sugar once daily,  in the morning before eating or drinking. 06/26/15   Tresa Garter, MD  buPROPion (WELLBUTRIN XL) 150 MG 24 hr tablet Take 1 tablet (150 mg total) by mouth daily. 05/18/15   Ripudeep Krystal Eaton, MD  gabapentin (NEURONTIN) 100 MG capsule Take 1 capsule (100 mg total) by mouth 3 (three) times daily. 05/18/15   Ripudeep Krystal Eaton, MD  glucose blood (TRUE METRIX BLOOD GLUCOSE TEST) test strip Use as instructed,  Check sugar once daily, in the morning before eating or drinking. 06/26/15   Tresa Garter, MD  TRUEPLUS LANCETS 28G MISC 1 each by Does not apply route every morning. Check sugar once daily, in the morning before eating or drinking. 06/26/15   Tresa Garter, MD  vitamin B-12 1000 MCG tablet Take 1 tablet (1,000 mcg total) by mouth daily. 05/30/15   Barton Dubois, MD   Pulse 86  Temp(Src) 98.3 F (36.8 C) (Oral)  Resp 13  SpO2 99% Physical Exam  Constitutional: He is oriented to person, place, and time. He appears well-developed and well-nourished. No distress.  HENT:  Head: Atraumatic.  Mouth/Throat: Oropharynx is clear and moist.  Eyes: Conjunctivae are normal. Pupils are equal, round, and reactive to light. No scleral icterus.  Neck: Normal range of motion. Neck supple. No tracheal deviation present.  Cardiovascular: Normal rate, regular rhythm, normal heart sounds and intact distal pulses.  Exam reveals no gallop and no friction rub.   No murmur heard. Pulmonary/Chest: Effort normal and breath sounds normal. No accessory muscle usage. No respiratory distress. He exhibits tenderness.  Abdominal: Soft. Bowel sounds are normal. He exhibits no distension and no mass. There is no tenderness. There is no rebound and no guarding.  Genitourinary:  No cva tenderness  Musculoskeletal: Normal range of motion. He exhibits no edema or tenderness.  CTLS spine, non tender, aligned, no step off.   Neurological: He is alert and oriented to person, place, and time. No cranial nerve deficit.  Motor intact bil, inconsistent effort but overall strength appears normal. Steady gait.   Skin: Skin is warm and dry. No rash noted. He is not diaphoretic.  Psychiatric:  Unusual affect. At times calm, unconcerned appearing, at other times anxious.   Nursing note and vitals reviewed.   ED Course  Procedures (including critical care time) Labs Review  Results for orders placed or performed during  the hospital encounter of 24/58/09  Basic metabolic panel  Result Value Ref Range   Sodium 137 135 - 145 mmol/L   Potassium 3.9 3.5 - 5.1 mmol/L   Chloride 109 101 - 111 mmol/L   CO2 22 22 - 32 mmol/L   Glucose, Bld 89 65 - 99 mg/dL   BUN 10 6 - 20 mg/dL   Creatinine, Ser 0.81 0.61 - 1.24 mg/dL   Calcium 9.1 8.9 - 10.3 mg/dL   GFR calc non Af Amer >60 >60 mL/min   GFR calc Af Amer >60 >60 mL/min   Anion gap 6 5 - 15  CBC  Result Value Ref Range   WBC 6.4 4.0 - 10.5 K/uL   RBC 4.88 4.22 - 5.81 MIL/uL   Hemoglobin 15.5 13.0 - 17.0 g/dL   HCT 44.4 39.0 - 52.0 %   MCV 91.0 78.0 - 100.0 fL   MCH 31.8 26.0 - 34.0 pg   MCHC 34.9 30.0 - 36.0 g/dL   RDW 13.6 11.5 - 15.5 %   Platelets 262 150 - 400 K/uL  I-Stat Troponin, ED (not at  [facility])  Result Value Ref Range   Troponin i, poc 0.00 0.00 - 0.08 ng/mL   Comment 3           Dg Chest 2 View  07/04/2015  CLINICAL DATA:  23 year old male with chest pain. EXAM: CHEST  2 VIEW COMPARISON:  Chest x-ray 05/24/2015. FINDINGS: Lung volumes are normal. No consolidative airspace disease. No pleural effusions. No pneumothorax. No pulmonary nodule or mass noted. Pulmonary vasculature and the cardiomediastinal silhouette are within normal limits. Axial No radiographic evidence of acute cardiopulmonary disease. Left-sided nipple ring incidentally noted. IMPRESSION: No active cardiopulmonary disease. Electronically Signed   By: Vinnie Langton M.D.   On: 07/04/2015 15:48      I have personally reviewed and evaluated these images and lab results as part of my medical decision-making.   EKG Interpretation   Date/Time:  Saturday July 04 2015 14:41:46 EDT Ventricular Rate:  90 PR Interval:  176 QRS Duration: 75 QT Interval:  333 QTC Calculation: 407 R Axis:   25 Text Interpretation:  Sinus rhythm Baseline wander in lead(s) V2 Normal  ECG No significant change since last tracing Confirmed by Ashok Cordia  MD,  Lennette Bihari ([id number]) on 07/04/2015 3:10:51 PM       MDM   Labs sent by nurse prior to my eval.  Reviewed nursing notes and prior charts for additional history.   On review prior charts, pt w apparent hx conversion disorder, with extensive evaluation here and at [facility] for a variety of neurologic symptoms.   +chest wall tenderness, otherwise exam unremarkable.   Tylenol po.  Recheck pt comfortable appearing, no acute distress.  Labs/xr ordered by RN unremarkable.   Feel constellation recent, and current symptoms, most c/w anxiety, conversion disorder.   rec close pcp f/u, also close outpt mental health f/u.       Lajean Saver, MD 07/04/15 [phone]

## 2015-07-04 NOTE — ED Notes (Signed)
Pt also c/o numbness in his arms and leg.  Pt states, "i'm not allowed to walk".

## 2015-07-04 NOTE — ED Notes (Signed)
Per EMS pt comes from home c/o chest discomfort, shoulder pain, back pain that started this morning 11am. [facility] and then today has the chest discomfort that is worse when he takes deep breaths.  Pt also has headache with generalized pain with blurred vision that "started when I was in [facility] 4-5 months ago".  Pt states that headache is constant.

## 2015-07-04 NOTE — ED Notes (Signed)
Bed: [id number] Expected date: 07/04/15 Expected time: 2:23 PM Means of arrival: Ambulance Comments: Weakness

## 2015-07-04 NOTE — Discharge Instructions (Signed)
It was our pleasure to provide your ER care today - we hope that you feel better.  Take tylenol as need for pain.  For routine medical care, and coordination of your medical care, follow up with primary care doctor in the next 1-2 weeks.  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   Return to ER if worse, new symptoms, fevers, medical emergency, other concern.     Conversion Disorder Conversion disorder is also known as functional neurological symptom disorder. People with this mental disorder have symptoms that suggest a brain or nervous system condition, such as a stroke or seizure. However, no such condition can be found to explain the symptoms. For people with conversion disorder, the symptoms may result in:  Severe distress or anxiety.  Disruption of relationships or other normal life activities.  Medical evaluation. This often occurs in the primary care or emergency room setting. Conversion disorder may begin anytime from late childhood through the young adult years. This disorder is more common in females than males.  CAUSES The exact cause of this disorder is unknown. It is often triggered by stressful life events involving personal relationships. SIGNS AND SYMPTOMS Signs and symptoms of conversion disorder may include:  Muscle weakness or paralysis. If this involves the bladder muscle, it can cause difficulty urinating.  Seizures or attacks of being unresponsive.  Abnormal movement, such as tremors, jerks, muscle spasms, loss of balance, or difficulty walking.  Difficulty swallowing or a feeling of having a lump in the throat.  Loss of speech (aphonia) or slurring of words (dysarthria).  Loss of the sensation of touch or pain.  Changes in vision, such as blindness or double vision.  Seeing things that are not there (hallucinations).  Changes in hearing, such as deafness. Symptoms usually occur suddenly but may increase gradually over  time. They usually go away completely in a short time. Seizures and tremors are more likely than other conversion symptoms to come back or continue. DIAGNOSIS Diagnosis of this disorder starts with an evaluation by your health care provider. Exams and tests will be done to rule out serious physical health problems. The evaluation will vary depending on your specific symptoms. It may include:  A physical exam, including a neurological exam.  Lab tests on blood or urine samples.  X-rays or other imaging studies.  An electroencephalogram (EEG) to monitor brain waves for seizure activity. Your health care provider may refer you to a mental health specialist for psychological evaluation. TREATMENT The main goal of treatment is to get the symptoms to go away as quickly as possible. Most people with this disorder respond well to relaxation techniques and reassurance from their health care provider that the symptoms are stress related. For people with symptoms that do not go away, the following treatment options are available:  Counseling or talk therapy. Talk therapy is provided by mental health specialists. It involves discussing stressful life events that may have triggered the symptoms and ways to cope with these issues.  Hypnotherapy. This is the use of hypnosis to help a person overcome conversion symptoms. It is provided by mental health specialists.  Amobarbital interview. This involves discussing stressful life events that may have triggered the symptoms. The mental health specialist asks you questions after you take a short-acting medicine (amobarbital) that produces a hypnotic state.  Medicine. Antidepressant medicine may be helpful even if a person with conversion symptoms is not depressed.  Physical therapy. Physical therapy can help maintain muscle strength in cases  of long-term paralysis. HOME CARE INSTRUCTIONS  Take medicines only as directed by your health care provider.  Try to  reduce stress.  Keep all follow-up visits as directed by your health care provider. This is important. SEEK MEDICAL CARE IF:  Your symptoms do not go away or they become severe.  You develop new symptoms. SEEK IMMEDIATE MEDICAL CARE IF: You have serious thoughts about hurting yourself or someone else.   This information is not intended to replace advice given to you by your health care provider. Make sure you discuss any questions you have with your health care provider.   Document Released: 10/08/2010 Document Revised: 09/26/2014 Document Reviewed: 01/16/2014 Elsevier Interactive Patient Education 2016 Elsevier Inc.    Chest Wall Pain Chest wall pain is pain in or around the bones and muscles of your chest. Sometimes, an injury causes this pain. Sometimes, the cause may not be known. This pain may take several weeks or longer to get better. HOME CARE INSTRUCTIONS  Pay attention to any changes in your symptoms. Take these actions to help with your pain:   Rest as told by your health care provider.   Avoid activities that cause pain. These include any activities that use your chest muscles or your abdominal and side muscles to lift heavy items.   If directed, apply ice to the painful area:  Put ice in a plastic bag.  Place a towel between your skin and the bag.  Leave the ice on for 20 minutes, 2-3 times per day.  Take over-the-counter and prescription medicines only as told by your health care provider.  Do not use tobacco products, including cigarettes, chewing tobacco, and e-cigarettes. If you need help quitting, ask your health care provider.  Keep all follow-up visits as told by your health care provider. This is important. SEEK MEDICAL CARE IF:  You have a fever.  Your chest pain becomes worse.  You have new symptoms. SEEK IMMEDIATE MEDICAL CARE IF:  You have nausea or vomiting.  You feel sweaty or light-headed.  You have a cough with phlegm (sputum) or  you cough up blood.  You develop shortness of breath.   This information is not intended to replace advice given to you by your health care provider. Make sure you discuss any questions you have with your health care provider.   Document Released: 09/05/2005 Document Revised: 05/27/2015 Document Reviewed: 12/01/2014 Elsevier Interactive Patient Education 2016 [place] Headache Without Cause A headache is pain or discomfort felt around the head or neck area. The specific cause of a headache may not be found. There are many causes and types of headaches. A few common ones are:  Tension headaches.  Migraine headaches.  Cluster headaches.  Chronic daily headaches. HOME CARE INSTRUCTIONS  Watch your condition for any changes. Take these steps to help with your condition: Managing Pain  Take over-the-counter and prescription medicines only as told by your health care provider.  Lie down in a dark, quiet room when you have a headache.  If directed, apply ice to the head and neck area:  Put ice in a plastic bag.  Place a towel between your skin and the bag.  Leave the ice on for 20 minutes, 2-3 times per day.  Use a heating pad or hot shower to apply heat to the head and neck area as told by your health care provider.  Keep lights dim if bright lights bother you or make your headaches worse.  Eating and Drinking  Eat meals on a regular schedule.  Limit alcohol use.  Decrease the amount of caffeine you drink, or stop drinking caffeine. General Instructions  Keep all follow-up visits as told by your health care provider. This is important.  Keep a headache journal to help find out what may trigger your headaches. For example, write down:  What you eat and drink.  How much sleep you get.  Any change to your diet or medicines.  Try massage or other relaxation techniques.  Limit stress.  Sit up straight, and do not tense your muscles.  Do not use  tobacco products, including cigarettes, chewing tobacco, or e-cigarettes. If you need help quitting, ask your health care provider.  Exercise regularly as told by your health care provider.  Sleep on a regular schedule. Get 7-9 hours of sleep, or the amount recommended by your health care provider. SEEK MEDICAL CARE IF:   Your symptoms are not helped by medicine.  You have a headache that is different from the usual headache.  You have nausea or you vomit.  You have a fever. SEEK IMMEDIATE MEDICAL CARE IF:   Your headache becomes severe.  You have repeated vomiting.  You have a stiff neck.  You have a loss of vision.  You have problems with speech.  You have pain in the eye or ear.  You have muscular weakness or loss of muscle control.  You lose your balance or have trouble walking.  You feel faint or pass out.  You have confusion.   This information is not intended to replace advice given to you by your health care provider. Make sure you discuss any questions you have with your health care provider.   Document Released: 09/05/2005 Document Revised: 05/27/2015 Document Reviewed: 12/29/2014 Elsevier Interactive Patient Education 2016 Elsevier Inc.    Back Pain, Adult Back pain is very common. The pain often gets better over time. The cause of back pain is usually not dangerous. Most people can learn to manage their back pain on their own.  HOME CARE  Watch your back pain for any changes. The following actions may help to lessen any pain you are feeling:  Stay active. Start with short walks on flat ground if you can. Try to walk farther each day.  Exercise regularly as told by your doctor. Exercise helps your back heal faster. It also helps avoid future injury by keeping your muscles strong and flexible.  Do not sit, drive, or stand in one place for more than 30 minutes.  Do not stay in bed. Resting more than 1-2 days can slow down your recovery.  Be careful  when you bend or lift an object. Use good form when lifting:  Bend at your knees.  Keep the object close to your body.  Do not twist.  Sleep on a firm mattress. Lie on your side, and bend your knees. If you lie on your back, put a pillow under your knees.  Take medicines only as told by your doctor.  Put ice on the injured area.  Put ice in a plastic bag.  Place a towel between your skin and the bag.  Leave the ice on for 20 minutes, 2-3 times a day for the first 2-3 days. After that, you can switch between ice and heat packs.  Avoid feeling anxious or stressed. Find good ways to deal with stress, such as exercise.  Maintain a healthy weight. Extra weight puts stress on your back.  GET HELP IF:   You have pain that does not go away with rest or medicine.  You have worsening pain that goes down into your legs or buttocks.  You have pain that does not get better in one week.  You have pain at night.  You lose weight.  You have a fever or chills. GET HELP RIGHT AWAY IF:   You cannot control when you poop (bowel movement) or pee (urinate).  Your arms or legs feel weak.  Your arms or legs lose feeling (numbness).  You feel sick to your stomach (nauseous) or throw up (vomit).  You have belly (abdominal) pain.  You feel like you may pass out (faint).   This information is not intended to replace advice given to you by your health care provider. Make sure you discuss any questions you have with your health care provider.   Document Released: 02/22/2008 Document Revised: 09/26/2014 Document Reviewed: 01/07/2014 Elsevier Interactive Patient Education 2016 Elsevier Inc.   Generalized Anxiety Disorder Generalized anxiety disorder (GAD) is a mental disorder. It interferes with life functions, including relationships, work, and school. GAD is different from normal anxiety, which everyone experiences at some point in their lives in response to specific life events and  activities. Normal anxiety actually helps [place] prepare for and get through these life events and activities. Normal anxiety goes away after the event or activity is over.  GAD causes anxiety that is not necessarily related to specific events or activities. It also causes excess anxiety in proportion to specific events or activities. The anxiety associated with GAD is also difficult to control. GAD can vary from mild to severe. People with severe GAD can have intense waves of anxiety with physical symptoms (panic attacks).  SYMPTOMS The anxiety and worry associated with GAD are difficult to control. This anxiety and worry are related to many life events and activities and also occur more days than not for 6 months or longer. People with GAD also have three or more of the following symptoms (one or more in children):  Restlessness.   Fatigue.  Difficulty concentrating.   Irritability.  Muscle tension.  Difficulty sleeping or unsatisfying sleep. DIAGNOSIS GAD is diagnosed through an assessment by your health care provider. Your health care provider will ask you questions aboutyour mood,physical symptoms, and events in your life. Your health care provider may ask you about your medical history and use of alcohol or drugs, including prescription medicines. Your health care provider may also do a physical exam and blood tests. Certain medical conditions and the use of certain substances can cause symptoms similar to those associated with GAD. Your health care provider may refer you to a mental health specialist for further evaluation. TREATMENT The following therapies are usually used to treat GAD:   Medication. Antidepressant medication usually is prescribed for long-term daily control. Antianxiety medicines may be added in severe cases, especially when panic attacks occur.   Talk therapy (psychotherapy). Certain types of talk therapy can be helpful in treating GAD by providing support,  education, and guidance. A form of talk therapy called cognitive behavioral therapy can teach you healthy ways to think about and react to daily life events and activities.  Stress managementtechniques. These include yoga, meditation, and exercise and can be very helpful when they are practiced regularly. A mental health specialist can help determine which treatment is best for you. Some people see improvement with one therapy. However, other people require a combination of therapies.   This  information is not intended to replace advice given to you by your health care provider. Make sure you discuss any questions you have with your health care provider.   Document Released: 12/31/2012 Document Revised: 09/26/2014 Document Reviewed: 12/31/2012 Elsevier Interactive Patient Education 2016 [place] and Stress Management Stress is a normal reaction to life events. It is what you feel when life demands more than you are used to or more than you can handle. Some stress can be useful. For example, the stress reaction can help you catch the last bus of the day, study for a test, or meet a deadline at work. But stress that occurs too often or for too long can cause problems. It can affect your emotional health and interfere with relationships and normal daily activities. Too much stress can weaken your immune system and increase your risk for physical illness. If you already have a medical problem, stress can make it worse. CAUSES  All sorts of life events may cause stress. An event that causes stress for one person may not be stressful for another person. Major life events commonly cause stress. These may be positive or negative. Examples include losing your job, moving into a new home, getting married, having a baby, or losing a loved one. Less obvious life events may also cause stress, especially if they occur day after day or in combination. Examples include working long hours, driving in  traffic, caring for children, being in debt, or being in a difficult relationship. SIGNS AND SYMPTOMS Stress may cause emotional symptoms including, the following:  Anxiety. This is feeling worried, afraid, on edge, overwhelmed, or out of control.  Anger. This is feeling irritated or impatient.  Depression. This is feeling sad, down, helpless, or guilty.  Difficulty focusing, remembering, or making decisions. Stress may cause physical symptoms, including the following:   Aches and pains. These may affect your head, neck, back, stomach, or other areas of your body.  Tight muscles or clenched jaw.  Low energy or trouble sleeping. Stress may cause unhealthy behaviors, including the following:   Eating to feel better (overeating) or skipping meals.  Sleeping too little, too much, or both.  Working too much or putting off tasks (procrastination).  Smoking, drinking alcohol, or using drugs to feel better. DIAGNOSIS  Stress is diagnosed through an assessment by your health care provider. Your health care provider will ask questions about your symptoms and any stressful life events.Your health care provider will also ask about your medical history and may order blood tests or other tests. Certain medical conditions and medicine can cause physical symptoms similar to stress. Mental illness can cause emotional symptoms and unhealthy behaviors similar to stress. Your health care provider may refer you to a mental health professional for further evaluation.  TREATMENT  Stress management is the recommended treatment for stress.The goals of stress management are reducing stressful life events and coping with stress in healthy ways.  Techniques for reducing stressful life events include the following:  Stress identification. Self-monitor for stress and identify what causes stress for you. These skills may help you to avoid some stressful events.  Time management. Set your priorities, keep a  calendar of events, and learn to say "no." These tools can help you avoid making too many commitments. Techniques for coping with stress include the following:  Rethinking the problem. Try to think realistically about stressful events rather than ignoring them or overreacting. Try to find the positives in a stressful situation rather  than focusing on the negatives.  Exercise. Physical exercise can release both physical and emotional tension. The key is to find a form of exercise you enjoy and do it regularly.  Relaxation techniques. These relax the body and mind. Examples include yoga, meditation, tai chi, biofeedback, deep breathing, progressive muscle relaxation, listening to music, being out in nature, journaling, and other hobbies. Again, the key is to find one or more that you enjoy and can do regularly.  Healthy lifestyle. Eat a balanced diet, get plenty of sleep, and do not smoke. Avoid using alcohol or drugs to relax.  Strong support network. Spend time with family, friends, or other people you enjoy being around.Express your feelings and talk things over with someone you trust. Counseling or talktherapy with a mental health professional may be helpful if you are having difficulty managing stress on your own. Medicine is typically not recommended for the treatment of stress.Talk to your health care provider if you think you need medicine for symptoms of stress. HOME CARE INSTRUCTIONS  Keep all follow-up visits as directed by your health care provider.  Take all medicines as directed by your health care provider. SEEK MEDICAL CARE IF:  Your symptoms get worse or you start having new symptoms.  You feel overwhelmed by your problems and can no longer manage them on your own. SEEK IMMEDIATE MEDICAL CARE IF:  You feel like hurting yourself or someone else.   This information is not intended to replace advice given to you by your health care provider. Make sure you discuss any  questions you have with your health care provider.   Document Released: 03/01/2001 Document Revised: 09/26/2014 Document Reviewed: 04/30/2013 Elsevier Interactive Patient Education [employer].     Emergency Department Resource Guide 1) Find a Doctor and Pay Out of Pocket Although you won't have to find out who is covered by your insurance plan, it is a good idea to ask around and get recommendations. You will then need to call the office and see if the doctor you have chosen will accept you as a new patient and what types of options they offer for patients who are self-pay. Some doctors offer discounts or will set up payment plans for their patients who do not have insurance, but you will need to ask so you aren't surprised when you get to your appointment.  2) Contact Your Local Health Department Not all health departments have doctors that can see patients for sick visits, but many do, so it is worth a call to see if yours does. If you don't know where your local health department is, you can check in your phone book. The CDC also has a tool to help you locate your state's health department, and many state websites also have listings of all of their local health departments.  3) Find a [facility] If your illness is not likely to be very severe or complicated, you may want to try a walk in clinic. These are popping up all over the country in pharmacies, drugstores, and shopping centers. They're usually staffed by nurse practitioners or physician assistants that have been trained to treat common illnesses and complaints. They're usually fairly quick and inexpensive. However, if you have serious medical issues or chronic medical problems, these are probably not your best option.  No Primary Care Doctor: - Call Health Connect at  [phone] - they can help you locate a primary care doctor that  accepts your insurance, provides certain services, etc. -  Physician Referral Service-  [phone]  Chronic Pain Problems: Organization         Address  Phone   Notes  [facility]  [phone] Patients need to be referred by their primary care doctor.   Medication Assistance: Organization         Address  Phone   Notes  [facility] Medication [facility] [address] [place], [place] [address] [phone] --Must be a resident of [facility] -- Must have NO insurance coverage whatsoever (no Medicaid/ Medicare, etc.) -- The pt. MUST have a primary care doctor that directs their care regularly and follows them in the community   [facility]  [phone]   [employer]  [phone]    Agencies that provide inexpensive medical care: Organization         Address  Phone   Notes  [place]  [phone]   [facility]    [phone]   [facility] [place], [place] [address] [phone]   [place] [address] [place] [phone]   [facility]    [phone]   [facility]    [phone]   [place] and [place] [address], [place] Phone:  [phone], Fax:  [phone] Hours of Operation:  9 am - 6 pm, M-F.  Also accepts Medicaid/Medicare and self-pay.  [facility] for [place] [address], [address], [place] Phone: [phone], Fax: [phone]. Hours of Operation:  8:30 am - 5:30 pm, M-F.  Also accepts Medicaid and self-pay.  [facility] [place] [address] [place] Phone: [phone]   [place], [place], [place] [phone], Ext. [phone] Mondays & Thursdays: 7-9 AM.  First 15 patients are seen on a first come, first serve basis.    [place] Providers:  Organization         Address  Phone   Notes  [facility] [address],  [place] [phone] Also accepts self-pay patients.  [facility] [address], [place]  [phone]   [place], [address], [place] [phone]   [facility] [address], [place] [phone]   Lucianne Lei [address], [place]   [phone] Only accepts [place] [facility] [place] patients after they have their name applied to their card.   Self-Pay (no insurance) in [facility]:  Organization         Address  Phone   Notes  Sickle Cell Patients, [facility] [place] [phone]   [facility] [place] [phone]   [facility] [place]  [place], [place], [place] [phone]   [facility]/Dr. Osei-Bonsu  [address], [place] or [address], [place] [phone] Phone number for both [place] and [place] locations is the same.  [facility] and [facility] [address], [place] [phone]   [facility] [address] [place] or [address] [address] [phone]   [facility], [place] [phone],  phone; [phone], fax Sees patients 1st and 3rd Saturday of every month.  Must not qualify for public or private insurance (i.e. Medicaid, Medicare, [place] Health Choice, Veterans' Benefits)  Household income should be no more than 200% of the poverty level The clinic cannot treat you if you are pregnant or think you are pregnant  Sexually transmitted diseases are not treated at the clinic.    Dental Care: Organization         Address  Phone  Notes  [facility] Department of [facility] [place] [phone] Accepts children up to age 29 who are enrolled in  [place] or [place]; pregnant women with a Medicaid card; and children who have applied for Medicaid or [place] Health Choice, but were declined, whose parents can pay a reduced fee at time of service.  [facility] Department of [facility]  [address], [place] [phone] Accepts children up to age 11 who are enrolled in [place] or [place]; pregnant women with a Medicaid card; and children who have applied for Medicaid or [place] Health Choice, but were declined, whose parents can pay a reduced fee at time of service.  [facility] Access PRO[facility]  [place] [phone] Patients are seen by appointment only. Walk-ins are not accepted. [place] will see patients 55 years of age and older. Monday - Tuesday (8am-5pm) Most Wednesdays (8:30-5pm) $30 per visit, cash only  [facility] Adult Dental Access PROGRAM  [address], [facility] [phone] Patients are seen by appointment only. Walk-ins are not accepted. [place] will see patients 69 years of age and older. One Wednesday Evening (Monthly: Volunteer Based).  $30 per visit, cash only  [place]  [phone] for adults; Children under age 30, call Graduate Pediatric Dentistry at [phone]. Children aged 6-14, please call [phone] to request a pediatric application.  Dental services are provided in all areas of dental care including fillings, crowns and bridges, complete and partial dentures, implants, gum treatment, root canals, and extractions. Preventive care is also provided. Treatment is provided to both adults and children. Patients are selected via a lottery and there is often a waiting list.   [facility] [address], [place]  [phone] [email or web address]   [facility] [address] [place] [address] [place], [place] [phone], Ext. [phone] Second and Fourth Thursday of each month, opens at 6:30  AM; Clinic ends at 9 AM.  Patients are seen on a first-come first-served basis, and a limited number are seen during each clinic.   [facility]  [address] Hillard Danker [place], [place] [phone]   Eligibility Requirements You must have lived in [place], [place], or [place] counties for at least the last three months.   You cannot be eligible for state or federal sponsored [employer], including [employer], [place], or [employer].   You generally cannot be eligible for healthcare insurance through your employer.    How to apply: Eligibility screenings are held every Tuesday and Wednesday afternoon from 1:00 pm until 4:00 pm. You do not need an appointment for the interview!  [facility] [address], [place], [place]   [place]  [place] Department  [place]  [phone]    Behavioral Health Resources in  the Community: Intensive Outpatient Programs Organization         Address  Phone  Notes  [place]. [address], [place], [place] [phone]   [facility] [address] [place], [place]   ADS: [facility] [address] [place], [place]   [place] [address],  [place], [place] or [phone]   Substance Abuse Resources Organization         Address  Phone  Notes  [facility]  [phone]   [place]  [phone]   [place]   Chinita Pester  [phone]   Residential & Outpatient Substance Abuse Program  [phone]   Psychological Services Organization         Address  Phone  Notes  [facility] [place]  [place]  [phone]   [place] [address] [place] or  [phone]    Mobile Crisis Teams Organization         Address  Phone  Notes  [facility], Mobile Crisis Care Unit  [phone]   [facility]  [address] [place], [place]   [place] [address] [place] [phone]    Self-Help/Support Groups Organization         Address  Phone             Notes  [place]. of [place] - variety of support groups  [place] Call for more information  Narcotics Anonymous (NA), [facility] [address], [employer] [place]  2 meetings at this location   Special educational needs teacher         Address  Phone  Notes  [facility] [place],    [place]  [phone]   [facility]  [address], [place] [address], [place], [place]   [place], [place] [phone] Admissions: 8am-3pm M-F  Incentives Substance [place] [address],    [place], [place] [phone]   [facility] [address] [place], [place], [place]   [facility] [address],  [place], [place]   [facility] - Intensive Outpatient [address], [place], [place]   [facility] ([place] No Name.,  [place], [place] [phone] or [phone]   [facility] ([facility]) [address] [place], [place] Accepts Medicaid  [place] [address]  [place] [phone] Substance Abuse/Addiction Treatment   [facility] Organization         Address  Phone  Notes  [facility]  [phone]   Domenic Schwab, PhD [address] [place]lis [place], [place]   [phone] or [phone]   [place], [place] [phone]   [place] [address]  [place], [place] [phone] Insurance/Medicaid/sponsorship through [employer] and Families [address], [place]                                    [place], [place] [phone] [place] [address] [place], [place] [phone]  Dr. Adele Schilder  [phone]   [facility] Dept. 1) [address], [place] 2) [place] 3)  [place] [address], [place] [phone]  [phone]   [place] [phone] or [phone] (After Hours)

## 2015-07-04 NOTE — ED Notes (Signed)
Nurse drawing labs. 

## 2015-07-04 NOTE — ED Notes (Signed)
Patient transported to X-ray 

## 2015-07-06 ENCOUNTER — Inpatient Hospital Stay: Payer: Self-pay | Admitting: Family Medicine

## 2015-07-06 ENCOUNTER — Ambulatory Visit: Payer: Self-pay

## 2015-07-07 ENCOUNTER — Ambulatory Visit: Payer: Medicaid Other | Attending: Family Medicine | Admitting: Family Medicine

## 2015-07-07 ENCOUNTER — Encounter: Payer: Self-pay | Admitting: Family Medicine

## 2015-07-07 ENCOUNTER — Encounter (HOSPITAL_BASED_OUTPATIENT_CLINIC_OR_DEPARTMENT_OTHER): Payer: Self-pay | Admitting: Clinical

## 2015-07-07 VITALS — BP 135/90 | HR 88 | Temp 97.8°F | Resp 16 | Ht 73.0 in | Wt 300.0 lb

## 2015-07-07 DIAGNOSIS — R0602 Shortness of breath: Secondary | ICD-10-CM | POA: Insufficient documentation

## 2015-07-07 DIAGNOSIS — R0600 Dyspnea, unspecified: Secondary | ICD-10-CM | POA: Insufficient documentation

## 2015-07-07 DIAGNOSIS — G629 Polyneuropathy, unspecified: Secondary | ICD-10-CM | POA: Diagnosis not present

## 2015-07-07 DIAGNOSIS — F17210 Nicotine dependence, cigarettes, uncomplicated: Secondary | ICD-10-CM | POA: Diagnosis not present

## 2015-07-07 DIAGNOSIS — F329 Major depressive disorder, single episode, unspecified: Secondary | ICD-10-CM | POA: Diagnosis not present

## 2015-07-07 DIAGNOSIS — R739 Hyperglycemia, unspecified: Secondary | ICD-10-CM

## 2015-07-07 DIAGNOSIS — R531 Weakness: Secondary | ICD-10-CM | POA: Insufficient documentation

## 2015-07-07 DIAGNOSIS — G709 Myoneural disorder, unspecified: Secondary | ICD-10-CM | POA: Diagnosis not present

## 2015-07-07 DIAGNOSIS — R5383 Other fatigue: Secondary | ICD-10-CM | POA: Insufficient documentation

## 2015-07-07 DIAGNOSIS — Z79899 Other long term (current) drug therapy: Secondary | ICD-10-CM | POA: Diagnosis not present

## 2015-07-07 DIAGNOSIS — R51 Headache: Secondary | ICD-10-CM | POA: Insufficient documentation

## 2015-07-07 DIAGNOSIS — F419 Anxiety disorder, unspecified: Principal | ICD-10-CM

## 2015-07-07 DIAGNOSIS — Z5189 Encounter for other specified aftercare: Secondary | ICD-10-CM | POA: Diagnosis present

## 2015-07-07 DIAGNOSIS — G8929 Other chronic pain: Secondary | ICD-10-CM

## 2015-07-07 DIAGNOSIS — R202 Paresthesia of skin: Secondary | ICD-10-CM | POA: Insufficient documentation

## 2015-07-07 DIAGNOSIS — F449 Dissociative and conversion disorder, unspecified: Secondary | ICD-10-CM

## 2015-07-07 DIAGNOSIS — F32A Depression, unspecified: Secondary | ICD-10-CM

## 2015-07-07 DIAGNOSIS — F418 Other specified anxiety disorders: Secondary | ICD-10-CM

## 2015-07-07 LAB — COMPREHENSIVE METABOLIC PANEL
ALT: 52 U/L — AB (ref 9–46)
AST: 26 U/L (ref 10–40)
Albumin: 4.7 g/dL (ref 3.6–5.1)
Alkaline Phosphatase: 62 U/L (ref 40–115)
BUN: 8 mg/dL (ref 7–25)
CHLORIDE: 106 mmol/L (ref 98–110)
CO2: 24 mmol/L (ref 20–31)
Calcium: 9.7 mg/dL (ref 8.6–10.3)
Creat: 0.89 mg/dL (ref 0.60–1.35)
GLUCOSE: 80 mg/dL (ref 65–99)
POTASSIUM: 4.2 mmol/L (ref 3.5–5.3)
Sodium: 139 mmol/L (ref 135–146)
Total Bilirubin: 0.5 mg/dL (ref 0.2–1.2)
Total Protein: 7.5 g/dL (ref 6.1–8.1)

## 2015-07-07 LAB — LIPID PANEL
CHOL/HDL RATIO: 5.8 ratio — AB (ref <=5.0)
Cholesterol: 167 mg/dL (ref 125–200)
HDL: 29 mg/dL — AB (ref >=40)
LDL CALC: 78 mg/dL (ref <130)
Triglycerides: 302 mg/dL — ABNORMAL HIGH (ref <150)
VLDL: 60 mg/dL — AB (ref <30)

## 2015-07-07 LAB — POCT GLYCOSYLATED HEMOGLOBIN (HGB A1C): Hemoglobin A1C: 4.5

## 2015-07-07 LAB — VITAMIN B12: VITAMIN B 12: 379 pg/mL (ref 211–911)

## 2015-07-07 NOTE — Progress Notes (Signed)
CC: Follow-up from hospitalization  HPI: Donald Mckay is a 24 y.o. male with a history of depression who was initially hospitalized at [facility] and received IVIG for presumedGuillain Barre syndrome after he had presented with lower extremity weakness, paresthesias and was later transferred to [facility] where he was hospitalized from 06/01/15-06/24/15.  Records from care every were reviewed. He had complained of bilateral lower extremity weakness, lower extremity sensation loss, headaches and blurry vision and he underwent several workup including EMG, lumbar puncture, MRI, serum electrophoresis and viral causes of myelopathy explored, HTLV screen came back negative and so complementary antibody testing was sent to [facility] and results are pending. He was evaluated by multiple specialties including neurology, neurosurgery, psychiatry and physical therapy. He was transferred to physical therapy where he underwent rehabilitation and received nursing care. 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 He was commenced on Prozac prior to discharge and recommended to follow-up with psychiatry outpatient as well as neurology.    Interval history: Today he complains of headaches, back pain and continues to have paresthesias in his legs and his arms. States the Topamax he takes a headache is not working. Also has some shortness of breath on mild exertion and some fatigue. He ambulates with a walker. He did not receive any outpatient appointments with neurology who has been to see psychiatry at [facility] and was commenced on Latuda and trazodone. He was asked to monitor his sugars ever since he was placed on Latuda due to the hyperglycemic side effect and his fasting sugars have ranged 100 and 140.  Allergies  Allergen Reactions  . Ibuprofen Other (See Comments)    Nose bleeds  . Fluzone  [Flu Virus Vaccine] Other (See Comments)    Patient with documented Guillain-Barre  syndrome diagnosis   Past Medical History  Diagnosis Date  . Medical history non-contributory   . Neuromuscular disorder (HCC)     GBS  . Depression 2012    History of self injury.   Current Outpatient Prescriptions on File Prior to Visit  Medication Sig Dispense Refill  . Blood Glucose Monitoring Suppl (TRUE METRIX METER) W/DEVICE KIT 1 each by Does not apply route every morning. Check sugar once daily, in the morning before eating or drinking. 1 kit 0  . glucose blood (TRUE METRIX BLOOD GLUCOSE TEST) test strip Use as instructed, Check sugar once daily, in the morning before eating or drinking. 100 each 3  . Lurasidone HCl (LATUDA) 60 MG TABS Take 60 mg by mouth daily.    [place] Kitchen topiramate (TOPAMAX) 50 MG tablet Take 50 mg by mouth at bedtime.    . TRUEPLUS LANCETS 28G MISC 1 each by Does not apply route every morning. Check sugar once daily, in the morning before eating or drinking. 100 each 3  . acetaminophen (TYLENOL) 325 MG tablet Take 2 tablets (650 mg total) by mouth every 6 (six) hours as needed for moderate pain or headache. (Patient not taking: Reported on 07/07/2015) 30 tablet 3  . buPROPion (WELLBUTRIN XL) 150 MG 24 hr tablet Take 1 tablet (150 mg total) by mouth daily. (Patient not taking: Reported on 07/07/2015) 30 tablet 1  . gabapentin (NEURONTIN) 100 MG capsule Take 1 capsule (100 mg total) by mouth 3 (three) times daily. (Patient not taking: Reported on 07/07/2015) 90 capsule 0  . polyethylene glycol (MIRALAX / GLYCOLAX) packet Take 17 g by mouth daily as needed for moderate constipation. (Patient not taking: Reported on 07/07/2015) 30  each 1  . vitamin B-12 1000 MCG tablet Take 1 tablet (1,000 mcg total) by mouth daily. (Patient not taking: Reported on 07/07/2015)     No current facility-administered medications on file prior to visit.   Family History  Problem Relation Age of Onset  . Cancer Mother   . Stroke Maternal Grandmother    Social History   Social History  .  Marital Status: Single    Spouse Name: N/A  . Number of Children: N/A  . Years of Education: N/A   Occupational History  . Not on file.   Social History Main Topics  . Smoking status: Current Every Day Smoker -- 0.25 packs/day    Types: Cigarettes  . Smokeless tobacco: Not on file  . Alcohol Use: Yes  . Drug Use: Yes    Special: Marijuana  . Sexual Activity: Yes   Other Topics Concern  . Not on file   Social History Narrative    Review of Systems: Constitutional: Negative for fever, chills, diaphoresis, activity change, appetite change and positive for fatigue. HENT: Negative for ear pain, nosebleeds, congestion, facial swelling, rhinorrhea, neck pain, neck stiffness and ear discharge.  Eyes: Negative for pain, discharge, redness, itching and positive for visual disturbance. Respiratory: Negative for cough, choking, chest tightness, shortness of breath, wheezing and stridor.  Cardiovascular: Negative for chest pain, palpitations and leg swelling, positive for shortness of breath Gastrointestinal: Negative for abdominal distention. Genitourinary: Negative for dysuria, urgency, frequency, hematuria, flank pain, decreased urine volume, difficulty urinating and dyspareunia.  Musculoskeletal: Negative for back pain, joint swelling, arthralgias and positive for weakness and lower extremity and gait problem. Neurological: Negative for dizziness, tremors, seizures, syncope, facial asymmetry, speech difficulty, light-headedness, positive for numbness and headaches.  Hematological: Negative for adenopathy. Does not bruise/bleed easily. Psychiatric/Behavioral: Negative for hallucinations, behavioral problems, confusion, dysphoric mood, decreased concentration and agitation.    Objective:   Filed Vitals:   07/07/15 1527  BP: 135/90  Pulse: 88  Temp: 97.8 F (36.6 C)  Resp: 16    Physical Exam: Constitutional: Patient appears obese, well-developed and well-nourished. No  distress. HENT: Normocephalic, atraumatic, External right and left ear normal. Oropharynx is clear and moist.  Eyes: Conjunctivae and EOM are normal. PERRLA, no scleral icterus. Neck: Normal ROM. Neck supple. No JVD. No tracheal deviation. No thyromegaly. CVS: RRR, S1/S2 +, no murmurs, no gallops, no carotid bruit.  Pulmonary: Effort and breath sounds normal, no stridor, rhonchi, wheezes, rales.  Abdominal: Soft. BS +,  no distension, tenderness, rebound or guarding.  Musculoskeletal: Normal range of motion. No edema and no tenderness.  Lymphadenopathy: No lymphadenopathy noted, cervical, inguinal or axillary Neuro: Alert. Normal reflexes, motor strength is 4/5 in upper extremity and 3+/5 in lower extremity; unsteady gait, dysesthesia in upper extremity. Skin: Skin is warm and dry. No rash noted. Not diaphoretic. No erythema. No pallor. Psychiatric: Normal mood and affect. Behavior, judgment, thought content normal.  Lab Results  Component Value Date   WBC 6.4 07/04/2015   HGB 15.5 07/04/2015   HCT 44.4 07/04/2015   MCV 91.0 07/04/2015   PLT 262 07/04/2015   Lab Results  Component Value Date   CREATININE 0.81 07/04/2015   BUN 10 07/04/2015   NA 137 07/04/2015   K 3.9 07/04/2015   CL 109 07/04/2015   CO2 22 07/04/2015    No results found for: HGBA1C Lipid Panel  No results found for: CHOL, TRIG, HDL, CHOLHDL, VLDL, LDLCALC     Assessment and plan:  Headaches: Uncontrolled. Increased dose of Topamax to 50 mg twice daily.  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 He has been informed to call the clinic once this has been sorted out so we can place the referral.  Depression: Currently on Latuda and trazodone. Advised  to keep appointment with [facility] and LCSW called in to see the patient for counseling session  Neuropathy: Unknown etiology. He was taken off gabapentin during his stay at [facility]  which I will not resume at this time.  Hyperglycemia: Fasting sugars have ranged from 100-140. I am sending off an A1c due to the fact that he is high risk for diabetes given history of being on Latuda.  Dyspnea: This could be secondary to deconditioning. He has also gained some weight and I have encouraged him to be up and about as tolerated and he will need to cut back on his portion sizes to reduce weight gain  This note has been created with Surveyor, quantity. Any transcriptional errors are unintentional.         Arnoldo Morale, MD. [facility] [phone] 07/07/2015, 4:31 PM

## 2015-07-07 NOTE — Progress Notes (Signed)
Pt's here for f/up visit from being hospitalized previously and pt Guillain Barr Syndrome.   Pt's c/o SOB x3-623mo, Pt states that the SOB has gotten worse since leaving the hospital.   Pt c/o of pain in head and back rated at 8. Medication for HA's isn't working.  Pt concern about swelling in legs and numbness in arms since his d/c from hospital.  Pt needs vitamin B12, lipid panel, pneumonia blood test per Erskine SquibbJane, RN nurse advise line.

## 2015-07-07 NOTE — Progress Notes (Signed)
ASSESSMENT: Pt currently experiencing symptoms of anxiety and depression. [facility] and continued BH med management with psychiatrist; would benefit from supportive counseling regarding coping with symptoms of anxiety and depression.  Stage of Change: precontemplative  PLAN: 1. F/U with behavioral health consultant at next PCP visit 2. Psychiatric Medications: Wellbutrin, Neurontin, Latuda 3. Behavioral recommendation(s):   -Continue taking BH medications as prescribed, talk to psychiatrist about any changes  SUBJECTIVE: Pt. referred by Dr Venetia NightAmao for symptoms of anxiety and depression:  Pt. reports the following symptoms/concerns: Pt states that [facility] has been [facility], but that "the voices are getting louder in my head with the Latuda, especially at night, I can hear them getting louder". He is uncertain whether or not he should continue taking. [facility] wants him to take group therapy, but he does not like being in groups, would prefer one-on-one.  Duration of problem: two weeks Severity: moderate  OBJECTIVE: Orientation & Cognition: Oriented x3. Thought processes normal and appropriate to situation. Mood: appropriate. Affect: appropriate Appearance: appropriate Risk of harm to self or others: no known risk of harm to self or others Substance use: alcohol, tobacco Assessments administered: PHQ9: 19/ GAD7: 14  Diagnosis: Anxiety and depression CPT Code: F41.8 -------------------------------------------- Other(s) present in the room: none  Time spent with patient in exam room: 10 minutes

## 2015-07-08 ENCOUNTER — Ambulatory Visit: Payer: MEDICAID | Attending: Family Medicine

## 2015-07-09 ENCOUNTER — Other Ambulatory Visit: Payer: Self-pay | Admitting: Family Medicine

## 2015-07-09 DIAGNOSIS — R519 Headache, unspecified: Secondary | ICD-10-CM | POA: Insufficient documentation

## 2015-07-09 DIAGNOSIS — R51 Headache: Secondary | ICD-10-CM

## 2015-07-09 MED ORDER — TOPIRAMATE 50 MG PO TABS: 50.0000 mg | ORAL_TABLET | Freq: Two times a day (BID) | ORAL | Status: DC

## 2015-07-10 ENCOUNTER — Telehealth: Payer: Self-pay

## 2015-07-10 NOTE — Telephone Encounter (Signed)
-----   Message from Jaclyn ShaggyEnobong [name], MD sent at 07/09/2015  9:04 AM EDT ----- Regarding: RE: Medication refill Prescription sent to pharmacy.  Thank you.  ----- Message -----    From: Earnestine LeysKimberly L Bennett-Curse, CMA    Sent: 07/08/2015   2:05 PM      To: Jaclyn ShaggyEnobong [name], MD Subject: Medication refill                              Pt came to pick meds prior to lunch for Topamax. Pt states that the Pharmacy told him that there wasn't a refill authorization for his medication. Please advise.

## 2015-07-13 ENCOUNTER — Telehealth: Payer: Self-pay

## 2015-07-13 NOTE — Telephone Encounter (Signed)
-----   Message from Jaclyn ShaggyEnobong [name], MD sent at 07/08/2015  2:29 PM EDT ----- Total cholesterol is normal but Triglycerides are elevated please advised him to use over-the-counter omega-3 fatty acid capsules 1 tablet twice daily.

## 2015-07-13 NOTE — Telephone Encounter (Signed)
CMA called pt, pt verified name and DOB. Pt was given lab results and verbalized that he understood his results with no further questions.

## 2015-08-17 ENCOUNTER — Emergency Department (HOSPITAL_COMMUNITY): Payer: Medicaid Other

## 2015-08-17 ENCOUNTER — Telehealth: Payer: Self-pay

## 2015-08-17 ENCOUNTER — Emergency Department (HOSPITAL_COMMUNITY)
Admission: EM | Admit: 2015-08-17 | Discharge: 2015-08-17 | Disposition: A | Discharge status: Home / Self Care | Payer: Medicaid Other | Attending: Emergency Medicine | Admitting: Emergency Medicine

## 2015-08-17 ENCOUNTER — Encounter (HOSPITAL_COMMUNITY): Payer: Self-pay

## 2015-08-17 DIAGNOSIS — G529 Cranial nerve disorder, unspecified: Secondary | ICD-10-CM | POA: Diagnosis not present

## 2015-08-17 DIAGNOSIS — R200 Anesthesia of skin: Secondary | ICD-10-CM | POA: Insufficient documentation

## 2015-08-17 DIAGNOSIS — T50905A Adverse effect of unspecified drugs, medicaments and biological substances, initial encounter: Secondary | ICD-10-CM

## 2015-08-17 DIAGNOSIS — R209 Unspecified disturbances of skin sensation: Secondary | ICD-10-CM

## 2015-08-17 DIAGNOSIS — G588 Other specified mononeuropathies: Secondary | ICD-10-CM | POA: Insufficient documentation

## 2015-08-17 DIAGNOSIS — M6281 Muscle weakness (generalized): Secondary | ICD-10-CM | POA: Insufficient documentation

## 2015-08-17 DIAGNOSIS — F329 Major depressive disorder, single episode, unspecified: Secondary | ICD-10-CM | POA: Insufficient documentation

## 2015-08-17 DIAGNOSIS — L519 Erythema multiforme, unspecified: Secondary | ICD-10-CM | POA: Diagnosis not present

## 2015-08-17 DIAGNOSIS — T426X5A Adverse effect of other antiepileptic and sedative-hypnotic drugs, initial encounter: Secondary | ICD-10-CM | POA: Insufficient documentation

## 2015-08-17 DIAGNOSIS — M549 Dorsalgia, unspecified: Secondary | ICD-10-CM | POA: Diagnosis present

## 2015-08-17 DIAGNOSIS — R112 Nausea with vomiting, unspecified: Secondary | ICD-10-CM | POA: Insufficient documentation

## 2015-08-17 DIAGNOSIS — Z79899 Other long term (current) drug therapy: Secondary | ICD-10-CM | POA: Insufficient documentation

## 2015-08-17 DIAGNOSIS — G43909 Migraine, unspecified, not intractable, without status migrainosus: Secondary | ICD-10-CM | POA: Diagnosis not present

## 2015-08-17 DIAGNOSIS — F17210 Nicotine dependence, cigarettes, uncomplicated: Secondary | ICD-10-CM | POA: Insufficient documentation

## 2015-08-17 DIAGNOSIS — R509 Fever, unspecified: Secondary | ICD-10-CM | POA: Diagnosis not present

## 2015-08-17 DIAGNOSIS — T887XXA Unspecified adverse effect of drug or medicament, initial encounter: Secondary | ICD-10-CM | POA: Diagnosis not present

## 2015-08-17 LAB — COMPREHENSIVE METABOLIC PANEL
ALBUMIN: 4.3 g/dL (ref 3.5–5.0)
ALK PHOS: 69 U/L (ref 38–126)
ALT: 30 U/L (ref 17–63)
ANION GAP: 9 (ref 5–15)
AST: 26 U/L (ref 15–41)
BILIRUBIN TOTAL: 0.7 mg/dL (ref 0.3–1.2)
BUN: 9 mg/dL (ref 6–20)
CALCIUM: 9.6 mg/dL (ref 8.9–10.3)
CO2: 23 mmol/L (ref 22–32)
Chloride: 106 mmol/L (ref 101–111)
Creatinine, Ser: 0.93 mg/dL (ref 0.61–1.24)
Glucose, Bld: 91 mg/dL (ref 65–99)
POTASSIUM: 4.3 mmol/L (ref 3.5–5.1)
Sodium: 138 mmol/L (ref 135–145)
TOTAL PROTEIN: 7.1 g/dL (ref 6.5–8.1)

## 2015-08-17 LAB — CBC WITH DIFFERENTIAL/PLATELET
Basophils Absolute: 0 K/uL (ref 0.0–0.1)
Basophils Relative: 1 %
Eosinophils Absolute: 0.3 K/uL (ref 0.0–0.7)
Eosinophils Relative: 4 %
HCT: 47.9 % (ref 39.0–52.0)
Hemoglobin: 16.7 g/dL (ref 13.0–17.0)
Lymphocytes Relative: 27 %
Lymphs Abs: 2 K/uL (ref 0.7–4.0)
MCH: 31.5 pg (ref 26.0–34.0)
MCHC: 34.9 g/dL (ref 30.0–36.0)
MCV: 90.2 fL (ref 78.0–100.0)
Monocytes Absolute: 0.5 K/uL (ref 0.1–1.0)
Monocytes Relative: 6 %
Neutro Abs: 4.7 K/uL (ref 1.7–7.7)
Neutrophils Relative %: 62 %
Platelets: 253 K/uL (ref 150–400)
RBC: 5.31 MIL/uL (ref 4.22–5.81)
RDW: 12.5 % (ref 11.5–15.5)
WBC: 7.5 K/uL (ref 4.0–10.5)

## 2015-08-17 LAB — CK: CK TOTAL: 49 U/L (ref 49–397)

## 2015-08-17 LAB — SEDIMENTATION RATE: Sed Rate: 1 mm/h (ref 0–16)

## 2015-08-17 MED ORDER — SODIUM CHLORIDE 0.9 % IV BOLUS (SEPSIS)
1000.0000 mL | Freq: Once | INTRAVENOUS | Status: AC
  Administered 2015-08-17: 1000 mL via INTRAVENOUS

## 2015-08-17 MED ORDER — SODIUM CHLORIDE 0.9 % IV SOLN
Freq: Once | INTRAVENOUS | Status: DC
Start: 2015-08-17 — End: 2015-08-17

## 2015-08-17 MED ORDER — TRAMADOL HCL 50 MG PO TABS: 50.0000 mg | ORAL_TABLET | Freq: Four times a day (QID) | ORAL | Status: DC | PRN

## 2015-08-17 MED ORDER — ONDANSETRON HCL 4 MG/2ML IJ SOLN
4.0000 mg | Freq: Once | INTRAMUSCULAR | Status: AC
  Administered 2015-08-17: 4 mg via INTRAVENOUS
  Filled 2015-08-17: qty 2

## 2015-08-17 MED ORDER — MORPHINE SULFATE (PF) 4 MG/ML IV SOLN
4.0000 mg | INTRAVENOUS | Status: DC | PRN
  Administered 2015-08-17 (×2): 4 mg via INTRAVENOUS
  Filled 2015-08-17 (×2): qty 1

## 2015-08-17 NOTE — Telephone Encounter (Signed)
I saw him once for a follow up from [facility] at which time he was already on Topamax. I see he was seen at the ED and by Neurology and advised to stop it and he is advised to do as he was informed and if symptoms worsen to go to the ED otherwise schedule an appointment.

## 2015-08-17 NOTE — ED Notes (Signed)
MD James at the bedside  

## 2015-08-17 NOTE — ED Notes (Signed)
Neurology PA at the bedside. 

## 2015-08-17 NOTE — ED Notes (Signed)
Xray at the bedside.

## 2015-08-17 NOTE — Consult Note (Signed)
NEURO HOSPITALIST CONSULT NOTE   Requestig physician: Dr. Jeneen Rinks   Reason for Consult: whole facial numbness  HPI:                                                                                                                                          Donald Gonce Fogg Brooke Bonito. is an 24 y.o. male who was sent to [facility] back in September and diagnosed with GBS, however during hospital stay it was noted he had preserved reflexes throughout and functional weakness. His CSF only showed a small increase in protein of 65. By time of discharge there was question of functional overlay.  Follow up ANA, HIV, MG panel HIV were all normal. Today he is in ED due to waking up with subjective facial numbness throughout his whole face and back of head. HE also feel he has decreased sensation in bilateral shoulders and arms.  He states he has chronic numbness in his legs. HE has a noted rash which was not present yesterday.  HE also states he has been having N/V yesterday.  HE denies any new medications, foods. He states he increased his Topamax from 50 mg q day to BID two weeks ago.   Past Medical History  Diagnosis Date  . Medical history non-contributory   . Neuromuscular disorder (HCC)     GBS  . Depression 2012    History of self injury.    Past Surgical History  Procedure Laterality Date  . Nasal cauterization      Family History  Problem Relation Age of Onset  . Cancer Mother   . Stroke Maternal Grandmother      Social History:  reports that he has been smoking Cigarettes.  He has been smoking about 0.25 packs per day. He has never used smokeless tobacco. He reports that he uses illicit drugs (Marijuana). He reports that he does not drink alcohol.  Allergies  Allergen Reactions  . Ibuprofen Other (See Comments)    Nose bleeds  . Fluzone  [Flu Virus Vaccine] Other (See Comments)    Patient with documented Guillain-Barre syndrome diagnosis    MEDICATIONS:  Current Facility-Administered Medications  Medication Dose Route Frequency Provider Last Rate Last Dose  . morphine 4 MG/ML injection 4 mg  4 mg Intravenous Q1H PRN Tanna Furry, MD      . ondansetron [facility]) injection 4 mg  4 mg Intravenous Once Tanna Furry, MD       Current Outpatient Prescriptions  Medication Sig Dispense Refill  . Lurasidone HCl (LATUDA) 60 MG TABS Take 120 mg by mouth daily.     Marland Kitchen topiramate (TOPAMAX) 50 MG tablet Take 1 tablet (50 mg total) by mouth 2 (two) times daily. 60 tablet 1  . traZODone (DESYREL) 100 MG tablet Take 100 mg by mouth at bedtime.    . vitamin B-12 1000 MCG tablet Take 1 tablet (1,000 mcg total) by mouth daily.    Marland Kitchen acetaminophen (TYLENOL) 325 MG tablet Take 2 tablets (650 mg total) by mouth every 6 (six) hours as needed for moderate pain or headache. (Patient not taking: Reported on 07/07/2015) 30 tablet 3  . Blood Glucose Monitoring Suppl (TRUE METRIX METER) W/DEVICE KIT 1 each by Does not apply route every morning. Check sugar once daily, in the morning before eating or drinking. 1 kit 0  . buPROPion (WELLBUTRIN XL) 150 MG 24 hr tablet Take 1 tablet (150 mg total) by mouth daily. (Patient not taking: Reported on 07/07/2015) 30 tablet 1  . gabapentin (NEURONTIN) 100 MG capsule Take 1 capsule (100 mg total) by mouth 3 (three) times daily. (Patient not taking: Reported on 07/07/2015) 90 capsule 0  . glucose blood (TRUE METRIX BLOOD GLUCOSE TEST) test strip Use as instructed, Check sugar once daily, in the morning before eating or drinking. 100 each 3  . polyethylene glycol (MIRALAX / GLYCOLAX) packet Take 17 g by mouth daily as needed for moderate constipation. (Patient not taking: Reported on 07/07/2015) 30 each 1  . TRUEPLUS LANCETS 28G MISC 1 each by Does not apply route every morning. Check sugar once daily, in the morning before eating or drinking. 100  each 3      ROS:                                                                                                                                       History obtained from the patient  General ROS: negative for - chills, fatigue, fever, night sweats, weight gain or weight loss Psychological ROS: negative for - behavioral disorder, hallucinations, memory difficulties, mood swings or suicidal ideation Ophthalmic ROS: negative for - blurry vision, double vision, eye pain or loss of vision ENT ROS: negative for - epistaxis, nasal discharge, oral lesions, sore throat, tinnitus or vertigo Allergy and Immunology ROS: negative for - hives or itchy/watery eyes Hematological and Lymphatic ROS: negative for - bleeding problems, bruising or swollen lymph nodes Endocrine ROS: negative for - galactorrhea, hair pattern changes, polydipsia/polyuria or temperature intolerance Respiratory ROS: negative for - cough, hemoptysis, shortness of  breath or wheezing Cardiovascular ROS: negative for - chest pain, dyspnea on exertion, edema or irregular heartbeat Gastrointestinal ROS: negative for - abdominal pain, diarrhea, hematemesis, nausea/vomiting or stool incontinence Genito-Urinary ROS: negative for - dysuria, hematuria, incontinence or urinary frequency/urgency Musculoskeletal ROS: negative for - joint swelling or muscular weakness Neurological ROS: as noted in HPI Dermatological ROS: negative for rash and skin lesion changes   Blood pressure 139/74, pulse 90, temperature 99.3 F (37.4 C), temperature source Oral, resp. rate 18, height _0  (1.854 m), weight 131.543 kg (290 lb), SpO2 98 %.   Neurologic Examination:                                                                                                      HEENT-  Normocephalic, no lesions, without obvious abnormality.  Normal external eye and conjunctiva.  Normal TM's bilaterally.  Normal auditory canals and external ears. Normal external nose,  mucus membranes and septum.  Normal pharynx. Cardiovascular- S1, S2 normal, pulses palpable throughout   Lungs- chest clear, no wheezing, rales, normal symmetric air entry Abdomen- normal findings: bowel sounds normal Extremities- no edema Lymph-no adenopathy palpable Musculoskeletal-no joint tenderness, deformity or swelling Skin-warm and dry, no hyperpigmentation, vitiligo, or suspicious lesions  Neurological Examination Mental Status: Alert, oriented, thought content appropriate.  Speech fluent without evidence of aphasia.  Able to follow 3 step commands without difficulty. Cranial Nerves: II: Discs flat bilaterally; Visual fields grossly normal, pupils equal, round, reactive to light and accommodation III,IV, VI: ptosis not present, extra-ocular motions intact bilaterally V,VII: smile symmetric, facial light touch sensation decreased throughout whole face and head.  VIII: hearing normal bilaterally IX,X: uvula rises symmetrically XI: bilateral shoulder shrug XII: midline tongue extension Motor: Right : Upper extremity   5/5    Left:     Upper extremity   5/5  Lower extremity   5/5     Lower extremity   5/5 Tone and bulk:normal tone throughout; no atrophy noted Sensory: Pinprick and light touch decreased in upper arms and legs (chronic) Deep Tendon Reflexes: 2+ and symmetric throughout Plantars: Right: downgoing   Left: downgoing Cerebellar: normal finger-to-nose,normal heel-to-shin test Gait: normal gait and station      Lab Results: Basic Metabolic Panel: No results for input(s): NA, K, CL, CO2, GLUCOSE, BUN, CREATININE, CALCIUM, MG, PHOS in the last 168 hours.  Liver Function Tests: No results for input(s): AST, ALT, ALKPHOS, BILITOT, PROT, ALBUMIN in the last 168 hours. No results for input(s): LIPASE, AMYLASE in the last 168 hours. No results for input(s): AMMONIA in the last 168 hours.  CBC: No results for input(s): WBC, NEUTROABS, HGB, HCT, MCV, PLT in the last  168 hours.  Cardiac Enzymes: No results for input(s): CKTOTAL, CKMB, CKMBINDEX, TROPONINI in the last 168 hours.  Lipid Panel: No results for input(s): CHOL, TRIG, HDL, CHOLHDL, VLDL, LDLCALC in the last 168 hours.  CBG: No results for input(s): GLUCAP in the last 168 hours.  Microbiology: Results for orders placed or performed during the hospital encounter of 05/24/15  MRSA PCR Screening  Status: None   Collection Time: 05/24/15  7:02 PM  Result Value Ref Range Status   MRSA by PCR NEGATIVE NEGATIVE Final    Comment:        The GeneXpert MRSA Assay (FDA approved for NASAL specimens only), is one component of a comprehensive MRSA colonization surveillance program. It is not intended to diagnose MRSA infection nor to guide or monitor treatment for MRSA infections.     Coagulation Studies: No results for input(s): LABPROT, INR in the last 72 hours.  Imaging: No results found.     Assessment and plan per attending neurologist  Etta Quill PA-C [facility] [phone]  08/17/2015, 11:51 AM   Assessment/Plan:  24 YO male with new onset of facial numbness, full body rash.  Likely SE of increased dose of Topamax in addition to dehydration. Currently he has not followed up with out patient neurology.    Recommend: 1) Hydration 2) D/C Topamax 3) Follow up with Neurology as out patient neurology where they may switch HA Px to other medications such as Depakote or Lamictal.

## 2015-08-17 NOTE — Discharge Instructions (Signed)
Stop Topomax. Follow up with neurologist    Erythema Multiforme Erythema multiforme is a rash that usually occurs on the skin, but can also occur on the lips and on the inside of the mouth. It is usually a mild condition that goes away on its own. It most often affects young adults and children. The rash shows up suddenly and often lasts 1-4 weeks. In some cases, the rash may come back again after clearing up. CAUSES  The cause of erythema multiforme may be an overreaction by the body's immune system to a trigger.  Common triggers include:   Infection, most commonly by the cold sore virus (human herpes virus, HSV), bacteria, or fungus. Less common triggers include:   Medicines.   Other illnesses.  In some cases, the cause may not be known.  SIGNS AND SYMPTOMS  The rash from erythema multiforme shows up suddenly. It may appear days after exposure to the trigger. It may start as small, red, round or oval marks that become bumps or raised welts over 24-48 hours. These bumps may resemble a target or a "bull's eye." These can spread and be quite large (about 1 inch [2.5 cm]). There may be mild itching or burning of the skin at first.  These skin changes usually appear first on the backs of the hands. They may then spread to the tops of the feet, the arms, the elbows, the knees, the palms, and the soles of the feet. There may be a mild rash on the lips and lining of the mouth. The skin rash may show up in waves over a few days.  It may take 2-4 weeks for the rash to go away. The rash may return at a later time.  DIAGNOSIS  Diagnosis of erythema multiforme is usually made based on a physical exam and medical history. To help confirm the diagnosis, a small piece of skin tissue is sometimes removed (skin biopsy) so it can be examined under a microscope by a specialist (pathologist). TREATMENT  Most episodes of erythema multiforme heal on their own. Treatment may not be needed. Your health care  provider will recommend removing or avoiding the trigger if possible. If the trigger is an infection or other illness, you may receive treatment for that infection or illness. You may also be given medicine for itching. Other medicines may be used for severe cases or to help prevent repeat bouts of erythema multiforme.  HOME CARE INSTRUCTIONS   Take medicines only as directed by your health care provider.   If possible, avoid known triggers.   If a medicine was your trigger, be sure to notify all of your health care providers. You should avoid this medicine or any like it in the future.   If your trigger was a herpes virus infection, use sunscreen lotion and sunscreen-containing lip balm to prevent sunlight triggered outbreaks of herpes virus.   Apply moist compresses as needed to help control itching. Cool or warm baths may also help. Avoid hot baths or showers.   Eat soft foods if you have mouth sores.   Keep all follow-up visits as directed by your health care provider. This is important.  SEEK MEDICAL CARE IF:   Your rash shows up again in the future.  You have a fever. SEEK IMMEDIATE MEDICAL CARE IF:   You develop redness and swelling on your lips or in your mouth.  You have a burning feeling on your lips or in your mouth.  You develop blisters or  open sores on your mouth, lips, vagina, penis, or anus.  You have eye pain, or you have redness or drainage in your eye.  You develop blisters on your skin.  You have difficulty breathing.  You have difficulty swallowing, or you start drooling.  You have blood in your urine.  You have pain with urination.   This information is not intended to replace advice given to you by your health care provider. Make sure you discuss any questions you have with your health care provider.   Document Released: 09/05/2005 Document Revised: 09/26/2014 Document Reviewed: 04/29/2014 Elsevier Interactive Patient Education [employer].

## 2015-08-17 NOTE — Telephone Encounter (Signed)
Patient called nurse, patient verified date of birth. Patient reports having Gullian-Barre Syndrome. Patient has not been able to feel legs since being diagnosed with Guillian-Barre Syndrome. Patient has been having back pain, for 2 days. Patient woke up this morning and cannot feel face and reports its hard to breathe, ""it comes and goes to where it feels like it takes a lot to breathe". Patient has rash all over body, rash does not itch or hurt.  Patient went to ED this morning and they told him the Topamax is causing these symptoms, per patient.  Patient was having symptoms while at the emergency department.  Patient is currently at hospital and wants to know if Dr. Venetia NightAmao has any recommendations.  Nurse explained importance of following recommendations of emergency room.  Nurse will forward message to Dr. Venetia NightAmao.

## 2015-08-17 NOTE — ED Notes (Signed)
Pt stable, ambulatory, states understanding of discharge instructions 

## 2015-08-17 NOTE — ED Notes (Signed)
Per EMS, Patient has a Hx of Guillian-Burre. Pt started to have Nausea, Vomiting, and body aches with slight fever starting a few days ago. Pt reports pain got increasingly worse today. Vitals per HR 80s, 120/80 BP.

## 2015-08-17 NOTE — ED Provider Notes (Signed)
CSN: [id number]     Arrival date & time 08/17/15  1112 History   First MD Initiated Contact with Patient 08/17/15 1120     Chief Complaint  Patient presents with  . Back Pain  . Generalized Body Aches      HPI  Patient presents for evaluation of a complaint of back pain and muscle aches primarily in his back. Also complains that his face feels numb on both sides and he thinks he might be "a little more weak in my left leg". Symptoms started yesterday. Also nausea and vomiting and a fever a few days ago but not today. During my evaluation I note a rash. He states he did not notice until "right before the ambulance got there".  Significant history for a diagnosis in June of gillan-Barre syndrome. Underwent treatment with IVIG. Had extensive evaluation including MRI lumbar puncture also he transferred to [facility]. Also carried a diagnosis of conversion syndrome as well.  States he underwent some rehabilitation because of the left leg weakness. Still does walk with a walker per his report because of the left leg.  Past Medical History  Diagnosis Date  . Medical history non-contributory   . Neuromuscular disorder (HCC)     GBS  . Depression 2012    History of self injury.   Past Surgical History  Procedure Laterality Date  . Nasal cauterization     Family History  Problem Relation Age of Onset  . Cancer Mother   . Stroke Maternal Grandmother    Social History  Substance Use Topics  . Smoking status: Current Every Day Smoker -- 0.25 packs/day    Types: Cigarettes  . Smokeless tobacco: Never Used  . Alcohol Use: No    Review of Systems  Constitutional: Positive for fever. Negative for chills, diaphoresis, appetite change and fatigue.  HENT: Negative for mouth sores, sore throat and trouble swallowing.   Eyes: Negative for visual disturbance.  Respiratory: Negative for cough, chest tightness, shortness of breath and wheezing.   Cardiovascular: Negative for chest pain.   Gastrointestinal: Positive for nausea, vomiting and diarrhea. Negative for abdominal pain and abdominal distention.  Endocrine: Negative for polydipsia, polyphagia and polyuria.  Genitourinary: Negative for dysuria, frequency and hematuria.  Musculoskeletal: Negative for gait problem.  Skin: Negative for color change, pallor and rash.  Neurological: Positive for weakness and numbness. Negative for dizziness, syncope, light-headedness and headaches.  Hematological: Does not bruise/bleed easily.  Psychiatric/Behavioral: Negative for behavioral problems and confusion.      Allergies  Ibuprofen and Fluzone   Home Medications   Prior to Admission medications   Medication Sig Start Date End Date Taking? Authorizing Provider  Lurasidone HCl (LATUDA) 60 MG TABS Take 120 mg by mouth daily.    Yes Historical Provider, MD  topiramate (TOPAMAX) 50 MG tablet Take 1 tablet (50 mg total) by mouth 2 (two) times daily. 07/09/15  Yes Arnoldo Morale, MD  traZODone (DESYREL) 100 MG tablet Take 100 mg by mouth at bedtime.   Yes Historical Provider, MD  vitamin B-12 1000 MCG tablet Take 1 tablet (1,000 mcg total) by mouth daily. 05/30/15  Yes Barton Dubois, MD  acetaminophen (TYLENOL) 325 MG tablet Take 2 tablets (650 mg total) by mouth every 6 (six) hours as needed for moderate pain or headache. Patient not taking: Reported on 07/07/2015 05/18/15   Ripudeep Krystal Eaton, MD  Blood Glucose Monitoring Suppl (TRUE METRIX METER) W/DEVICE KIT 1 each by Does not apply route every morning. Check  sugar once daily, in the morning before eating or drinking. 06/26/15   Tresa Garter, MD  buPROPion (WELLBUTRIN XL) 150 MG 24 hr tablet Take 1 tablet (150 mg total) by mouth daily. Patient not taking: Reported on 07/07/2015 05/18/15   Ripudeep Krystal Eaton, MD  gabapentin (NEURONTIN) 100 MG capsule Take 1 capsule (100 mg total) by mouth 3 (three) times daily. Patient not taking: Reported on 07/07/2015 05/18/15   Ripudeep Krystal Eaton, MD   glucose blood (TRUE METRIX BLOOD GLUCOSE TEST) test strip Use as instructed, Check sugar once daily, in the morning before eating or drinking. 06/26/15   Tresa Garter, MD  polyethylene glycol (MIRALAX / GLYCOLAX) packet Take 17 g by mouth daily as needed for moderate constipation. Patient not taking: Reported on 07/07/2015 05/18/15   Ripudeep Krystal Eaton, MD  traMADol (ULTRAM) 50 MG tablet Take 1 tablet (50 mg total) by mouth every 6 (six) hours as needed. 08/17/15   Tanna Furry, MD  TRUEPLUS LANCETS 28G MISC 1 each by Does not apply route every morning. Check sugar once daily, in the morning before eating or drinking. 06/26/15   Tresa Garter, MD   BP 120/67 mmHg  Pulse 82  Temp(Src) 99.3 F (37.4 C) (Oral)  Resp 18  Ht 6' 1"  (1.854 m)  Wt 290 lb (131.543 kg)  BMI 38.27 kg/m2  SpO2 99% Physical Exam  Constitutional: He is oriented to person, place, and time. He appears well-developed and well-nourished. No distress.  Patient is awake and alert. Does not appear distressed. In discussing his week face and his multitude of symptoms he has a rather grand indifference.  HENT:  Head: Normocephalic.  Normal facial motor exam.  States no feeling to Bilateral face on exam.    Eyes: Conjunctivae are normal. Pupils are equal, round, and reactive to light. No scleral icterus.  Neck: Normal range of motion. Neck supple. No thyromegaly present.  Cardiovascular: Normal rate and regular rhythm.  Exam reveals no gallop and no friction rub.   No murmur heard. Pulmonary/Chest: Effort normal and breath sounds normal. No respiratory distress. He has no wheezes. He has no rales.  Abdominal: Soft. Bowel sounds are normal. He exhibits no distension. There is no tenderness. There is no rebound.  Musculoskeletal: Normal range of motion.  Neurological: He is alert and oriented to person, place, and time. A cranial nerve deficit and sensory deficit is present.  Reports diminished sensation to bilateral face  V1 through V3 symmetric. However normal facial musculature and the remainder of cranial nerves intact. 4 over 5 strength left lower extremity. He states this is his baseline  Skin: Skin is warm and dry. No rash noted.  Multiple salmon colored macules diffusely on the chest somewhat central clearing. No particular pattern. No herald patch. Most consistent with erythema multiforme. No mucosal lesions.  Psychiatric: He has a normal mood and affect. His behavior is normal.    ED Course  Procedures (including critical care time) Labs Review Labs Reviewed  CBC WITH DIFFERENTIAL/PLATELET  COMPREHENSIVE METABOLIC PANEL  CK  SEDIMENTATION RATE    Imaging Review Dg Chest Port 1 View  08/17/2015  CLINICAL DATA:  Difficulty breathing, facial swelling and numbness. Low-grade fever. Diffuse rash. Cough. Vomiting, decreased appetite. EXAM: PORTABLE CHEST 1 VIEW COMPARISON:  07/04/2015. FINDINGS: Trachea is midline. Heart size normal. Lungs are clear. No pleural fluid. IMPRESSION: Negative. Electronically Signed   By: Lorin Picket M.D.   On: 08/17/2015 12:20   I have personally  reviewed and evaluated these images and lab results as part of my medical decision-making.   EKG Interpretation None      MDM   Final diagnoses:  Medication side effect, initial encounter  Numbness  Erythema multiforme    Seen by neurology. Ultimate decision is to stop Topamax. No sign of weakness or focal neurological deficit. Topamax can cause numbness, myalgias, and erythema multiforme. Plan is expectant management. Discontinue Topamax. Ultram for headaches or body aches. Encouraged neurological follow-up.    Tanna Furry, MD 08/17/15 [phone]

## 2015-08-18 NOTE — Telephone Encounter (Signed)
Nurse called patient, patient verified date of birth. Patient reports the ED told him to stop taking Topamax and today the rash will be better. Rash has spread and is not getting better.  Patient reports he is going to [facility] for rash.  Patient will call and make an appointment, if needed, after going to ED.

## 2015-08-19 ENCOUNTER — Emergency Department (HOSPITAL_COMMUNITY)
Admission: EM | Admit: 2015-08-19 | Discharge: 2015-08-20 | Disposition: A | Discharge status: Home / Self Care | Payer: Medicaid Other | Attending: Emergency Medicine | Admitting: Emergency Medicine

## 2015-08-19 ENCOUNTER — Encounter (HOSPITAL_COMMUNITY): Payer: Self-pay | Admitting: Emergency Medicine

## 2015-08-19 DIAGNOSIS — Z79899 Other long term (current) drug therapy: Secondary | ICD-10-CM | POA: Diagnosis not present

## 2015-08-19 DIAGNOSIS — G709 Myoneural disorder, unspecified: Secondary | ICD-10-CM | POA: Insufficient documentation

## 2015-08-19 DIAGNOSIS — F17210 Nicotine dependence, cigarettes, uncomplicated: Secondary | ICD-10-CM | POA: Insufficient documentation

## 2015-08-19 DIAGNOSIS — R200 Anesthesia of skin: Secondary | ICD-10-CM | POA: Insufficient documentation

## 2015-08-19 DIAGNOSIS — F329 Major depressive disorder, single episode, unspecified: Secondary | ICD-10-CM | POA: Insufficient documentation

## 2015-08-19 DIAGNOSIS — M549 Dorsalgia, unspecified: Secondary | ICD-10-CM

## 2015-08-19 MED ORDER — DEXAMETHASONE SODIUM PHOSPHATE 10 MG/ML IJ SOLN
10.0000 mg | Freq: Once | INTRAMUSCULAR | Status: AC
  Administered 2015-08-19: 10 mg via INTRAVENOUS
  Filled 2015-08-19: qty 1

## 2015-08-19 MED ORDER — KETOROLAC TROMETHAMINE 30 MG/ML IJ SOLN
30.0000 mg | Freq: Once | INTRAMUSCULAR | Status: AC
  Administered 2015-08-19: 30 mg via INTRAVENOUS
  Filled 2015-08-19: qty 1

## 2015-08-19 NOTE — ED Notes (Signed)
Pt reports new bilateral facial numbness with facial symmetry noted. Says facial numbness started yesterday around 1100. C/o chronic bilateral leg numbness (hx Guillain Barre syndrome-[facility] No associated symptoms except intermittent SOB. C/o chronic back pain. Patient able to smile and control facial expressions. Able to shut eyes tightly and open with control. When touched on both sides of face-reports he is unable to determine which side of his face I'm touching. No other c/c.

## 2015-08-19 NOTE — ED Provider Notes (Signed)
CSN: [id number]     Arrival date & time 08/19/15  2051 History   First MD Initiated Contact with Patient 08/19/15 2235     Chief Complaint  Patient presents with  . Numbness    HPI   Donald Mckay is an 24 y.o. male with history of guillan-barre syndrome who presents to the ED for evaluation of facial numbness and back pain. He was seen in the ED two days ago with this same facial numbness. Neurology was consulted and recommended discontinuing topamax as pt also had new rash, but suggested conversion disorder as a possible etiology for his numbness. Pt states that his rash is improving but his facial numbness continues. He reports he can control his facial movement, has not noticed any weakness or droop, but states he cannot feel when anything touches his face. Denies any spreading of his numbness or new weakness. He states that he would like a referral to local neurologist and the reason he has not followed up with [facility] is because it is so far.  Pt is also complaining of diffuse back pain. He states that this is chronic. He reports the pain is all over. Denies fever, chills, saddle paresthesias, bowel/bladder incontinence. Denies injury or trauma. He states that he had previously been on Vicodin for the pain but since his GBS diagnosis his PCP does not want him on narcotics. States that steroids have been somewhat helpful in the past. Denies chest pain, SOB, abd pain, N/V/D.  Past Medical History  Diagnosis Date  . Medical history non-contributory   . Neuromuscular disorder (HCC)     GBS  . Depression 2012    History of self injury.   Past Surgical History  Procedure Laterality Date  . Nasal cauterization     Family History  Problem Relation Age of Onset  . Cancer Mother   . Stroke Maternal Grandmother    Social History  Substance Use Topics  . Smoking status: Current Every Day Smoker -- 0.25 packs/day    Types: Cigarettes  . Smokeless tobacco: Never Used  . Alcohol Use: No     Review of Systems  All other systems reviewed and are negative.     Allergies  Ibuprofen and Fluzone   Home Medications   Prior to Admission medications   Medication Sig Start Date End Date Taking? Authorizing Provider  Lurasidone HCl (LATUDA) 60 MG TABS Take 120 mg by mouth daily.    Yes Historical Provider, MD  traZODone (DESYREL) 100 MG tablet Take 100 mg by mouth at bedtime.   Yes Historical Provider, MD  vitamin B-12 1000 MCG tablet Take 1 tablet (1,000 mcg total) by mouth daily. 05/30/15  Yes Barton Dubois, MD  acetaminophen (TYLENOL) 325 MG tablet Take 2 tablets (650 mg total) by mouth every 6 (six) hours as needed for moderate pain or headache. Patient not taking: Reported on 07/07/2015 05/18/15   Ripudeep Krystal Eaton, MD  Blood Glucose Monitoring Suppl (TRUE METRIX METER) W/DEVICE KIT 1 each by Does not apply route every morning. Check sugar once daily, in the morning before eating or drinking. 06/26/15   Tresa Garter, MD  buPROPion (WELLBUTRIN XL) 150 MG 24 hr tablet Take 1 tablet (150 mg total) by mouth daily. Patient not taking: Reported on 07/07/2015 05/18/15   Ripudeep Krystal Eaton, MD  gabapentin (NEURONTIN) 100 MG capsule Take 1 capsule (100 mg total) by mouth 3 (three) times daily. Patient not taking: Reported on 07/07/2015 05/18/15   Ripudeep K Rai,  MD  glucose blood (TRUE METRIX BLOOD GLUCOSE TEST) test strip Use as instructed, Check sugar once daily, in the morning before eating or drinking. 06/26/15   Tresa Garter, MD  polyethylene glycol (MIRALAX / GLYCOLAX) packet Take 17 g by mouth daily as needed for moderate constipation. Patient not taking: Reported on 07/07/2015 05/18/15   Ripudeep Krystal Eaton, MD  topiramate (TOPAMAX) 50 MG tablet Take 1 tablet (50 mg total) by mouth 2 (two) times daily. Patient not taking: Reported on 08/19/2015 07/09/15   Arnoldo Morale, MD  traMADol (ULTRAM) 50 MG tablet Take 1 tablet (50 mg total) by mouth every 6 (six) hours as needed. Patient  not taking: Reported on 08/19/2015 08/17/15   Tanna Furry, MD  TRUEPLUS LANCETS 28G MISC 1 each by Does not apply route every morning. Check sugar once daily, in the morning before eating or drinking. 06/26/15   Tresa Garter, MD   BP 138/74 mmHg  Pulse 83  Temp(Src) 99.1 F (37.3 C) (Temporal)  Resp 18  SpO2 99% Physical Exam  Constitutional: He is oriented to person, place, and time. No distress.  HENT:  Right Ear: External ear normal.  Left Ear: External ear normal.  Nose: Nose normal.  Mouth/Throat: Oropharynx is clear and moist. No oropharyngeal exudate.  Eyes: Conjunctivae and EOM are normal. Pupils are equal, round, and reactive to light.  Neck: Normal range of motion. Neck supple.  Cardiovascular: Normal rate, regular rhythm, normal heart sounds and intact distal pulses.   No murmur heard. Pulmonary/Chest: Effort normal and breath sounds normal. No respiratory distress. He has no wheezes.  Abdominal: Soft. Bowel sounds are normal. He exhibits no distension. There is no tenderness. There is no rebound and no guarding.  Musculoskeletal: Normal range of motion. He exhibits no edema.  Diffuse muscular tenderness. No spinal tenderness. No stepoff. FROM. Intact strength and sensation in bilat UE and LE  Neurological: He is alert and oriented to person, place, and time. He has normal strength. He displays a negative Romberg sign. Coordination normal.  Pt reports he cannot feel touch anywhere on his face. Cranial nerves are otherwise intact.   Skin: Skin is warm and dry. He is not diaphoretic.  Psychiatric: He has a normal mood and affect.  Nursing note and vitals reviewed.   ED Course  Procedures (including critical care time) Labs Review Labs Reviewed - No data to display  Imaging Review No results found. I have personally reviewed and evaluated these images and lab results as part of my medical decision-making.   EKG Interpretation   Date/Time:  Wednesday August 19 2015 21:02:39 EST Ventricular Rate:  104 PR Interval:  143 QRS Duration: 82 QT Interval:  324 QTC Calculation: 426 R Axis:   59 Text Interpretation:  Sinus tachycardia T wave inversion No significant  change was found Confirmed by [place] ([id number]) on 08/19/2015  9:52:41 PM      MDM   Final diagnoses:  Facial numbness  Back pain, unspecified location   Neuro exam is consistent with prior visits. Given his history, prior neurology consults, and no new symptoms, no further workup regarding facial numbness warranted at this time. I strongly encouraged outpatient neuro f/u. Pt states that one reason he hasn't followed up is because he doesn't have a neuro locally. Will give referral to [facility].   Regarding back pain, likely muscular spasm/strain as pt is diffusely tender over his entire back. No specific spinal tenderness. No red flag  symptoms. Intact strength and sensation. Pt reports some improvement with toradol and decadron. Will give short course of prednisone. Instructed to take ibuprofen or naproxen for back pain. I will hold off on any kind of muscle relaxant or stronger pain killer given pt's history of GBS and risk ofdecreased respiratory drive. ER return prcautions given.      Anne Ng, PA-C 08/20/15 1009   ADDENDUM  EKG was obtained in triage. I am not sure why. Pt denies any kind of chest pain, SOB, new weakness. EKG showed tachycardia but is otherwise stable from prior. Tachycardia resolved.  Anne Ng, PA-C 08/20/15 1012  Merrily Pew, MD 08/21/15 [phone]

## 2015-08-20 MED ORDER — PREDNISONE 20 MG PO TABS: 60.0000 mg | ORAL_TABLET | Freq: Every day | ORAL | Status: DC

## 2015-08-20 NOTE — Discharge Instructions (Signed)
As we discussed, please follow-up with neurology as an outpatient for further evaluation and management of your numbness and Guillan Barre syndrome. Please establish primary care and follow-up with a primary care provider as well. They can help you manage your chronic back pain.   Take medications as prescribed. Return to the emergency room for worsening condition or new concerning symptoms.   Emergency Department Resource Guide 1) Find a Doctor and Pay Out of Pocket Although you won't have to find out who is covered by your insurance plan, it is a good idea to ask around and get recommendations. You will then need to call the office and see if the doctor you have chosen will accept you as a new patient and what types of options they offer for patients who are self-pay. Some doctors offer discounts or will set up payment plans for their patients who do not have insurance, but you will need to ask so you aren't surprised when you get to your appointment.  2) Contact Your Local Health Department Not all health departments have doctors that can see patients for sick visits, but many do, so it is worth a call to see if yours does. If you don't know where your local health department is, you can check in your phone book. The CDC also has a tool to help you locate your state's health department, and many state websites also have listings of all of their local health departments.  3) Find a Walk-in Clinic If your illness is not likely to be very severe or complicated, you may want to try a walk in clinic. These are popping up all over the country in pharmacies, drugstores, and shopping centers. They're usually staffed by nurse practitioners or physician assistants that have been trained to treat common illnesses and complaints. They're usually fairly quick and inexpensive. However, if you have serious medical issues or chronic medical problems, these are probably not your best option.  No Primary Care  Doctor: - Call Health Connect at  [phone] - they can help you locate a primary care doctor that  accepts your insurance, provides certain services, etc. - Physician Referral [phone]- [phone]  Emergency Department Resource Guide 1) Find a Doctor and Pay Out of Pocket Although you won't have to find out who is covered by your insurance plan, it is a good idea to ask around and get recommendations. You will then need to call the office and see if the doctor you have chosen will accept you as a new patient and what types of options they offer for patients who are self-pay. Some doctors offer discounts or will set up payment plans for their patients who do not have insurance, but you will need to ask so you aren't surprised when you get to your appointment.  2) Contact Your Local Health Department Not all health departments have doctors that can see patients for sick visits, but many do, so it is worth a call to see if yours does. If you don't know where your local health department is, you can check in your phone book. The CDC also has a tool to help you locate your state's health department, and many state websites also have listings of all of their local health departments.  3) Find a Walk-in Clinic If your illness is not likely to be very severe or complicated, you may want to try a walk in clinic. These are popping up all over the country in pharmacies, drugstores, and shopping centers. They're  usually staffed by nurse practitioners or physician assistants that have been trained to treat common illnesses and complaints. They're usually fairly quick and inexpensive. However, if you have serious medical issues or chronic medical problems, these are probably not your best option.  No Primary Care Doctor: - Call Health Connect at  [phone] - they can help you locate a primary care doctor that  accepts your insurance, provides certain services, etc. - Physician Referral Service-  [phone]  Chronic Pain Problems: Organization         Address  Phone   Notes  [facility]  [phone] Patients need to be referred by their primary care doctor.   Medication Assistance: Organization         Address  Phone   Notes  [facility] Medication [facility] [address] [place], [place] [address] [phone] --Must be a resident of [facility] -- Must have NO insurance coverage whatsoever (no Medicaid/ Medicare, etc.) -- The pt. MUST have a primary care doctor that directs their care regularly and follows them in the community   [facility]  [phone]   [employer]  [phone]    Agencies that provide inexpensive medical care: Organization         Address  Phone   Notes  [facility]  [phone]   [facility]    [phone]   [facility] [address] [place], [place] [address] [phone]   [facility] [address] [place] [phone]   [facility]anned Parenthood    [phone]   [facility]    [phone]   [facility]  [address], [place] Phone:  [phone], Fax:  [phone] Hours of Operation:  9 am - 6 pm, M-F.  Also accepts Medicaid/Medicare and self-pay.  [facility]  [address], [place] Phone: [phone], Fax: [phone]. Hours of Operation:  8:30 am - 5:30 pm, M-F.  Also accepts Medicaid and self-pay.  [facility] [place] [address] [place] Phone: [phone]   [facility] [address] Natasha Bence [place], [place] [phone], Ext. [phone] Mondays & Thursdays: 7-9 AM.  First 15 patients are seen on a first come, first serve basis.    Medicaid-accepting [facility] Providers:  Organization         Address  Phone   Notes  [facility] [address],  [place] [phone] Also accepts self-pay patients.  [facility] [address] Laurell Josephs [place], [place]  [phone]   [facility] [address], [place] [phone]   [facility] [address] [place] [phone]   Renaye Rakers [address], [place]   [phone] Only accepts [place] patients after they have their name applied to their card.   Self-Pay (no insurance) in [facility]:  Organization         Address  Phone   Notes  Sickle Cell Patients, [facility] [address] [place], [place] [phone]   [facility] [address] [place], [place] [phone]   [facility] [place]  [address], [place] [phone]   [facility]/Dr. Osei-Bonsu  [address], [place] or [address], [place] [phone] Phone number for both [place] and [place] locations is the same.  [facility]gent Medical and [facility] [address] [place] [phone]   [facility] [address] [place] or [address] [phone]   [facility] [address], [place] [phone], phone; [phone], fax Sees patients 1st and 3rd Saturday of every month.  Must not qualify for public or private insurance (i.e. Medicaid, Medicare, [place] Health Choice, Veterans' Benefits)  Household income should be no more than 200% of the poverty level The clinic cannot treat you if you are pregnant or think you are pregnant  Sexually transmitted diseases are not treated at the clinic.

## 2015-08-27 ENCOUNTER — Inpatient Hospital Stay: Payer: Self-pay | Admitting: Family Medicine

## 2015-09-02 ENCOUNTER — Ambulatory Visit: Payer: Medicaid Other | Attending: Family Medicine | Admitting: Family Medicine

## 2015-09-02 ENCOUNTER — Encounter: Payer: Self-pay | Admitting: Family Medicine

## 2015-09-02 VITALS — BP 137/83 | HR 83 | Temp 98.3°F | Resp 18 | Ht 73.0 in | Wt 324.0 lb

## 2015-09-02 DIAGNOSIS — M545 Low back pain, unspecified: Secondary | ICD-10-CM

## 2015-09-02 DIAGNOSIS — R202 Paresthesia of skin: Secondary | ICD-10-CM | POA: Diagnosis not present

## 2015-09-02 DIAGNOSIS — G629 Polyneuropathy, unspecified: Secondary | ICD-10-CM | POA: Diagnosis not present

## 2015-09-02 DIAGNOSIS — R21 Rash and other nonspecific skin eruption: Secondary | ICD-10-CM | POA: Diagnosis not present

## 2015-09-02 DIAGNOSIS — R531 Weakness: Secondary | ICD-10-CM | POA: Insufficient documentation

## 2015-09-02 DIAGNOSIS — G729 Myopathy, unspecified: Secondary | ICD-10-CM

## 2015-09-02 DIAGNOSIS — M549 Dorsalgia, unspecified: Secondary | ICD-10-CM | POA: Insufficient documentation

## 2015-09-02 DIAGNOSIS — Z888 Allergy status to other drugs, medicaments and biological substances status: Secondary | ICD-10-CM | POA: Diagnosis not present

## 2015-09-02 DIAGNOSIS — R519 Headache, unspecified: Secondary | ICD-10-CM

## 2015-09-02 DIAGNOSIS — Z887 Allergy status to serum and vaccine status: Secondary | ICD-10-CM | POA: Diagnosis not present

## 2015-09-02 DIAGNOSIS — F17210 Nicotine dependence, cigarettes, uncomplicated: Secondary | ICD-10-CM | POA: Diagnosis not present

## 2015-09-02 DIAGNOSIS — F329 Major depressive disorder, single episode, unspecified: Secondary | ICD-10-CM

## 2015-09-02 DIAGNOSIS — R51 Headache: Secondary | ICD-10-CM | POA: Diagnosis not present

## 2015-09-02 DIAGNOSIS — F32A Depression, unspecified: Secondary | ICD-10-CM

## 2015-09-02 LAB — CK: Total CK: 36 U/L (ref 7–232)

## 2015-09-02 MED ORDER — METHOCARBAMOL 750 MG PO TABS: 750.0000 mg | ORAL_TABLET | Freq: Two times a day (BID) | ORAL | Status: DC | PRN

## 2015-09-02 NOTE — Progress Notes (Signed)
Patient's here for what he describes as numbness in head/face and legs. Pain in head and face radiates down to patients shoulders, rated at 8/10.   Back pain described as pins and needles rated @ 8/10.

## 2015-09-02 NOTE — Progress Notes (Signed)
Subjective:    Patient ID: Donald Mckay Donald Mckay., male    DOB: 1991-07-25, 24 y.o.   MRN: [id number]  HPI 25 year old male with a history of depression who was initially hospitalized at [facility] and received IVIG for presumed Guillain Barre syndrome after he had presented with lower extremity weakness, paresthesias and was later transferred to [facility] where he was hospitalized from 06/01/15-06/24/15.  Records from care every were reviewed. He had complained of bilateral lower extremity weakness, lower extremity sensation loss, headaches and blurry vision and he underwent several workup including EMG, lumbar puncture, MRI, serum electrophoresis and viral causes of myelopathy explored, HTLV screen came back negative. He was evaluated by multiple specialties including neurology, neurosurgery, psychiatry and physical therapy. He was transferred to physical therapy where he underwent rehabilitation and received nursing care. 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 He was commenced on Prozac prior to discharge and recommended to follow-up with psychiatry outpatient as well as neurology.  Interval history: At his last office visit he had complained of persisting headaches for which the dose of his Topamax had been increased but he presented to [facility] ED with a rash and numbness and Topamax was discontinued by neurology as that was thought to be the etiology. He later presented to [facility] regional ED where he was placed on prednisone which resolved the rash and he was told he could restart his Topamax which he is yet to restart. He still has the persisting numbness, lower extremity weakness and headaches. He also has low back pain and states he has no money to pay for tramadol. He no longer uses the walker which he was using at his last office visit 6 weeks ago and is able to take slow steps but states his legs feel heavy when he walks.   Past Medical History    Diagnosis Date  . Medical history non-contributory   . Neuromuscular disorder (HCC)     GBS  . Depression 2012    History of self injury.    Past Surgical History  Procedure Laterality Date  . Nasal cauterization      Social History   Social History  . Marital Status: Single    Spouse Name: N/A  . Number of Children: N/A  . Years of Education: N/A   Occupational History  . Not on file.   Social History Main Topics  . Smoking status: Current Every Day Smoker -- 0.25 packs/day    Types: Cigarettes  . Smokeless tobacco: Never Used  . Alcohol Use: No  . Drug Use: Yes    Special: Marijuana  . Sexual Activity: Yes   Other Topics Concern  . Not on file   Social History Narrative    Allergies  Allergen Reactions  . Ibuprofen Other (See Comments)    Nose bleeds  . Fluzone  [Flu Virus Vaccine] Other (See Comments)    Patient with documented Guillain-Barre syndrome diagnosis      Review of Systems Constitutional: Negative for fever, chills, diaphoresis, activity change, appetite change and positive for fatigue. HENT: Negative for ear pain, nosebleeds, congestion, facial swelling, rhinorrhea, neck pain, neck stiffness and ear discharge.  Eyes: Negative for pain, discharge, redness, itching and positive for visual disturbance. Respiratory: Negative for cough, choking, chest tightness, shortness of breath, wheezing and stridor.  Cardiovascular: Negative for chest pain, palpitations and leg swelling, positive for shortness of breath Gastrointestinal: Negative for abdominal distention. Genitourinary: Negative for dysuria,  urgency, frequency, hematuria, flank pain, decreased urine volume, difficulty urinating and dyspareunia.  Musculoskeletal: see hpi Neurological: Negative for dizziness, tremors, seizures, syncope, facial asymmetry, speech difficulty, light-headedness, positive for numbness and headaches.  Hematological: Negative for adenopathy. Does not bruise/bleed  easily. Psychiatric/Behavioral: Negative for hallucinations, behavioral problems, confusion, dysphoric mood, decreased concentration and agitation.       Objective: Filed Vitals:   09/02/15 1526 09/02/15 1532  BP:  137/83  Pulse:  83  Temp:  98.3 F (36.8 C)  TempSrc:  Oral  Resp:  18  Height: 6\' 1"  (1.854 m)   Weight: 324 lb (146.965 kg)   SpO2:  98%      Physical Exam Constitutional: Patient appears obese, well-developed and well-nourished. No distress. HENT: Normocephalic, atraumatic, External right and left ear normal. Oropharynx is clear and moist.  Eyes: Conjunctivae and EOM are normal. PERRLA, no scleral icterus. Neck: Normal ROM. Neck supple. No JVD. No tracheal deviation. No thyromegaly. CVS: RRR, S1/S2 +, no murmurs, no gallops, no carotid bruit.  Pulmonary: Effort and breath sounds normal, no stridor, rhonchi, wheezes, rales.  Abdominal: Soft. BS +,  no distension, tenderness, rebound or guarding.  Musculoskeletal: Normal range of motion. Right paraspinal lumbar tenderness; negative straight leg raise bilaterally  Lymphadenopathy: No lymphadenopathy noted, cervical, inguinal or axillary Neuro: Alert. Normal reflexes, motor strength is 4/5 in upper extremity and 3+/5 in lower extremity; unsteady gait, dysesthesia in upper extremity. Skin: Skin is warm and dry. No rash noted. Not diaphoretic. No erythema. No pallor. Psychiatric: Normal mood and affect. Behavior, judgment, thought content normal.        Assessment & Plan:  Headaches: Uncontrolled. He was taken off Topamax by neurology during his inpatient visit. We'll defer management of this to his neurologist that his appointment.  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 [facility] and LCSW called in to see the patient for counseling session  Paresthesia/ Neuropathy: Unknown etiology. He [facility]  which I will not resume at this time.  Back pain: He is unable to afford tramadol and so I will send a prescription for Robaxin to the pharmacy. He has not had any lower back imaging and so this will be ordered at his next visit if symptoms persist   This note has been created with Education officer, environmentalDragon speech recognition software and smart phrase technology. Any transcriptional errors are unintentional.

## 2015-09-04 ENCOUNTER — Telehealth: Payer: Self-pay

## 2015-09-04 NOTE — Telephone Encounter (Signed)
-----   Message from Jaclyn ShaggyEnobong [name], MD sent at 09/03/2015 10:52 PM EST ----- Please inform the patient that labs are normal. Thank you.

## 2015-09-04 NOTE — Telephone Encounter (Signed)
CMA called patient, patient verified name and DOB. Patient was given his lab results verbalizing he understood, with no further questions.

## 2015-09-07 ENCOUNTER — Ambulatory Visit: Payer: Medicaid Other | Attending: Physician Assistant | Admitting: Physician Assistant

## 2015-09-07 ENCOUNTER — Telehealth: Payer: Self-pay | Admitting: Family Medicine

## 2015-09-07 ENCOUNTER — Telehealth: Payer: Self-pay

## 2015-09-07 VITALS — BP 124/80 | HR 87 | Temp 98.2°F | Resp 18 | Ht 73.0 in | Wt 324.0 lb

## 2015-09-07 DIAGNOSIS — J029 Acute pharyngitis, unspecified: Secondary | ICD-10-CM | POA: Diagnosis not present

## 2015-09-07 MED ORDER — AMOXICILLIN 500 MG PO CAPS: 500.0000 mg | ORAL_CAPSULE | Freq: Three times a day (TID) | ORAL | Status: DC

## 2015-09-07 NOTE — Progress Notes (Signed)
Chief Complaint: Difficulty swallowing, lump in throat, headaches  Subjective: This is a Donald Mckay with multiple medical problems which I have reviewed his chart thoroughly. He most recently was here on December 14th. He has an outstanding referral for neurology. He is having difficulty with cost in regards making/keeping this appointment.  However, today he presents with sore throat for 3-4 days. Worse over the last 24 hours. Located to the left side. Associated left-sided headaches and swollen lymph nodes. Has had intermittent fever. No chills. No chest pain. No shortness of breath. No cough. No runny nose.   ROS:  GEN: possible intermittent fever, no chills, denies change in weight Skin: denies lesions or rashes HEENT: + headache on left side, +earache, no epistaxis, +sore throat and sore cervical lymph nodes LUNGS: denies SHOB, dyspnea, PND, orthopnea CV: denies CP or palpitations    Objective:  Filed Vitals:   09/07/15 1031 09/07/15 1034  BP:  124/80  Pulse:  87  Temp:  98.2 F (36.8 C)  TempSrc:  Oral  Resp:  18  Height: 6' 1" (1.854 m)   Weight: 324 lb (146.965 kg)   SpO2:  98%    Physical Exam:  General: Obese and in no acute distress. HEENT: Normocephalic, pupils equal and reactive to light. Injected throat with left-sided exudate. Increased cervical lymphadenopathy on left. Heart: Regular rate and rhythm, no murmur Lungs: Clear to auscultation bilaterally. Neuro: Alert, awake, oriented x3, nonfocal.   Medications: Prior to Admission medications   Medication Sig Start Date End Date Taking? Authorizing Provider  Blood Glucose Monitoring Suppl (TRUE METRIX METER) W/DEVICE KIT 1 each by Does not apply route every morning. Check sugar once daily, in the morning before eating or drinking. 06/26/15  Yes Olugbemiga E Jegede, MD  glucose blood (TRUE METRIX BLOOD GLUCOSE TEST) test strip Use as instructed, Check sugar once daily, in the morning before eating or  drinking. 06/26/15  Yes Olugbemiga E Jegede, MD  Lurasidone HCl (LATUDA) 60 MG TABS Take 120 mg by mouth daily.    Yes Historical Provider, MD  methocarbamol (ROBAXIN-750) 750 MG tablet Take 1 tablet (750 mg total) by mouth 2 (two) times daily as needed for muscle spasms. 09/02/15  Yes Enobong [name], MD  predniSONE (DELTASONE) 20 MG tablet Take 3 tablets (60 mg total) by mouth daily. 08/20/15  Yes Serena Y Sam, PA-C  TRUEPLUS LANCETS 28G MISC 1 each by Does not apply route every morning. Check sugar once daily, in the morning before eating or drinking. 06/26/15  Yes Olugbemiga E Jegede, MD  vitamin B-12 1000 MCG tablet Take 1 tablet (1,000 mcg total) by mouth daily. 05/30/15  Yes Carlos Madera, MD  acetaminophen (TYLENOL) 325 MG tablet Take 2 tablets (650 mg total) by mouth every 6 (six) hours as needed for moderate pain or headache. Patient not taking: Reported on 07/07/2015 05/18/15   Ripudeep K Rai, MD  amoxicillin (AMOXIL) 500 MG capsule Take 1 capsule (500 mg total) by mouth 3 (three) times daily. 09/07/15   Tiffany S Noel, PA-C  buPROPion (WELLBUTRIN XL) 150 MG 24 hr tablet Take 1 tablet (150 mg total) by mouth daily. Patient not taking: Reported on 07/07/2015 05/18/15   Ripudeep K Rai, MD  gabapentin (NEURONTIN) 100 MG capsule Take 1 capsule (100 mg total) by mouth 3 (three) times daily. Patient not taking: Reported on 07/07/2015 05/18/15   Ripudeep K Rai, MD  polyethylene glycol (MIRALAX / GLYCOLAX) packet Take 17 g by mouth daily as needed for   moderate constipation. Patient not taking: Reported on 07/07/2015 05/18/15   Ripudeep K Rai, MD  QUEtiapine (SEROQUEL) 50 MG tablet Take 50 mg by mouth at bedtime.    Historical Provider, MD  topiramate (TOPAMAX) 50 MG tablet Take 1 tablet (50 mg total) by mouth 2 (two) times daily. Patient not taking: Reported on 08/19/2015 07/09/15   Enobong [name], MD  traMADol (ULTRAM) 50 MG tablet Take 1 tablet (50 mg total) by mouth every 6 (six) hours as needed. Patient  not taking: Reported on 08/19/2015 08/17/15   Mark James, MD  traZODone (DESYREL) 100 MG tablet Take 100 mg by mouth at bedtime. Reported on 09/02/2015    Historical Provider, MD    Assessment: Acute pharyngitis.   Plan: Amoxicillin Warm salt water gargles Smoking cessation  Follow up:as scheduled  The patient was given clear instructions to go to ER or return to medical center if symptoms don't improve, worsen or new problems develop. The patient verbalized understanding. The patient was told to call to get lab results if they haven't heard anything in the next week.   This note has been created with Dragon speech recognition software and smart phrase technology. Any transcriptional errors are unintentional.   Tiffany Noel, PA-C 09/07/2015, 10:54 AM    

## 2015-09-07 NOTE — Patient Instructions (Addendum)
Warm salt water gargles 3 times per day Take the antibiotic 3 times per day Try to save the money in order to see a Neurologist ASAP Stop smoking

## 2015-09-07 NOTE — Telephone Encounter (Signed)
CMA called patient, patient verified name and DOB. Patient was told that the Neurologist did not accept the orange card. I spoke to Arna Mediciora about what the patient stated and she informed me that patient was sent an [facility]. Patient said that he have not receive [facility]. I told patient that i will send another packet to his address.

## 2015-09-07 NOTE — Telephone Encounter (Signed)
-----   Message from Dionne BucyNora E Soler sent at 09/07/2015 11:45 AM EST ----- Regarding: RE: Orange Card Correct . But they accept the [facility] discount . 09/02/15   Pt don't have insurance I send a letter to patient with application to apply for the [facility] discount to be refer to a Neurology specialist.    ----- Message -----    From: Earnestine LeysKimberly L Bennett-Curse, CMA    Sent: 09/07/2015  10:02 AM      To: Dionne BucyNora E Soler Subject: Orange Card                                    Hey, I received a call from the patient stating that the referral to the Neurologist does not take the orange card.   Thanks!

## 2015-09-07 NOTE — Telephone Encounter (Signed)
Pt. Called stating that he has a lump in his throat and it hurt. Pt. Does not want to go to the ED and would like some advise. Please f/u with pt.

## 2015-09-07 NOTE — Telephone Encounter (Signed)
Called patient and encouraged him to come to clinic now and see PA in our walk-in clinic.  Patient states he will come.

## 2015-09-07 NOTE — Progress Notes (Signed)
Patient her for numbness in face,SOB, swelling in left lymph nodes rating pain at 10/10.   Patient states his thorat feels like it's closing up making it difficult to breath.

## 2015-09-08 ENCOUNTER — Encounter (HOSPITAL_COMMUNITY): Payer: Self-pay

## 2015-09-08 ENCOUNTER — Emergency Department (HOSPITAL_COMMUNITY)
Admission: EM | Admit: 2015-09-08 | Discharge: 2015-09-08 | Disposition: A | Discharge status: Home / Self Care | Payer: Medicaid Other | Attending: Emergency Medicine | Admitting: Emergency Medicine

## 2015-09-08 ENCOUNTER — Telehealth: Payer: Self-pay | Admitting: General Practice

## 2015-09-08 DIAGNOSIS — Z7952 Long term (current) use of systemic steroids: Secondary | ICD-10-CM | POA: Diagnosis not present

## 2015-09-08 DIAGNOSIS — F329 Major depressive disorder, single episode, unspecified: Secondary | ICD-10-CM | POA: Diagnosis not present

## 2015-09-08 DIAGNOSIS — J358 Other chronic diseases of tonsils and adenoids: Secondary | ICD-10-CM

## 2015-09-08 DIAGNOSIS — F17210 Nicotine dependence, cigarettes, uncomplicated: Secondary | ICD-10-CM | POA: Insufficient documentation

## 2015-09-08 DIAGNOSIS — Z79899 Other long term (current) drug therapy: Secondary | ICD-10-CM | POA: Diagnosis not present

## 2015-09-08 DIAGNOSIS — Z792 Long term (current) use of antibiotics: Secondary | ICD-10-CM | POA: Insufficient documentation

## 2015-09-08 DIAGNOSIS — J029 Acute pharyngitis, unspecified: Secondary | ICD-10-CM | POA: Diagnosis not present

## 2015-09-08 DIAGNOSIS — Z8669 Personal history of other diseases of the nervous system and sense organs: Secondary | ICD-10-CM | POA: Diagnosis not present

## 2015-09-08 DIAGNOSIS — R000 Tachycardia, unspecified: Secondary | ICD-10-CM | POA: Diagnosis not present

## 2015-09-08 DIAGNOSIS — J039 Acute tonsillitis, unspecified: Secondary | ICD-10-CM

## 2015-09-08 HISTORY — DX: Guillain-Barre syndrome: G61.0

## 2015-09-08 LAB — RAPID STREP SCREEN (MED CTR MEBANE ONLY): Streptococcus, Group A Screen (Direct): NEGATIVE

## 2015-09-08 NOTE — ED Provider Notes (Signed)
CSN: [id number]     Arrival date & time 09/08/15  1542 History  By signing my name below, I, Donald Mckay, attest that this documentation has been prepared under the direction and in the presence of Delrae Rend, PA-C Electronically Signed: Ladene Artist, ED Scribe 09/08/2015 at 3:59 PM.   Chief Complaint  Patient presents with  . Sore Throat   The history is provided by the patient. No language interpreter was used.   HPI Comments: Donald Apo Trieu Brooke Bonito. is a 24 y.o. male who presents to the Emergency Department complaining of persistent sore throat onset yesterday. Pain is exacerbated with swallowing. He was seen at urgent care yesterday for the same with no tx. He denies fever and cough. No sick contacts with similar symptoms. Denies dysphagia, trismus, drooling.  Past Medical History  Diagnosis Date  . Medical history non-contributory   . Neuromuscular disorder (HCC)     GBS  . Depression 2012    History of self injury.   Past Surgical History  Procedure Laterality Date  . Nasal cauterization     Family History  Problem Relation Age of Onset  . Cancer Mother   . Stroke Maternal Grandmother    Social History  Substance Use Topics  . Smoking status: Current Every Day Smoker -- 0.25 packs/day    Types: Cigarettes  . Smokeless tobacco: Never Used  . Alcohol Use: No    Review of Systems  Constitutional: Negative for fever.  HENT: Positive for sore throat.   Respiratory: Negative for cough.   All other systems reviewed and are negative.  Allergies  Ibuprofen and Fluzone   Home Medications   Prior to Admission medications   Medication Sig Start Date End Date Taking? Authorizing Provider  acetaminophen (TYLENOL) 325 MG tablet Take 2 tablets (650 mg total) by mouth every 6 (six) hours as needed for moderate pain or headache. Patient not taking: Reported on 07/07/2015 05/18/15   Ripudeep Krystal Eaton, MD  amoxicillin (AMOXIL) 500 MG capsule Take 1 capsule (500 mg total) by  mouth 3 (three) times daily. 09/07/15   Brayton Caves, PA-C  Blood Glucose Monitoring Suppl (TRUE METRIX METER) W/DEVICE KIT 1 each by Does not apply route every morning. Check sugar once daily, in the morning before eating or drinking. 06/26/15   Tresa Garter, MD  buPROPion (WELLBUTRIN XL) 150 MG 24 hr tablet Take 1 tablet (150 mg total) by mouth daily. Patient not taking: Reported on 07/07/2015 05/18/15   Ripudeep Krystal Eaton, MD  gabapentin (NEURONTIN) 100 MG capsule Take 1 capsule (100 mg total) by mouth 3 (three) times daily. Patient not taking: Reported on 07/07/2015 05/18/15   Ripudeep Krystal Eaton, MD  glucose blood (TRUE METRIX BLOOD GLUCOSE TEST) test strip Use as instructed, Check sugar once daily, in the morning before eating or drinking. 06/26/15   Tresa Garter, MD  Lurasidone HCl (LATUDA) 60 MG TABS Take 120 mg by mouth daily.     Historical Provider, MD  methocarbamol (ROBAXIN-750) 750 MG tablet Take 1 tablet (750 mg total) by mouth 2 (two) times daily as needed for muscle spasms. 09/02/15   Arnoldo Morale, MD  polyethylene glycol (MIRALAX / GLYCOLAX) packet Take 17 g by mouth daily as needed for moderate constipation. Patient not taking: Reported on 07/07/2015 05/18/15   Ripudeep Krystal Eaton, MD  predniSONE (DELTASONE) 20 MG tablet Take 3 tablets (60 mg total) by mouth daily. 08/20/15   Olivia Canter Broady Lafoy, PA-C  QUEtiapine (SEROQUEL) 50  MG tablet Take 50 mg by mouth at bedtime.    Historical Provider, MD  topiramate (TOPAMAX) 50 MG tablet Take 1 tablet (50 mg total) by mouth 2 (two) times daily. Patient not taking: Reported on 08/19/2015 07/09/15   Arnoldo Morale, MD  traMADol (ULTRAM) 50 MG tablet Take 1 tablet (50 mg total) by mouth every 6 (six) hours as needed. Patient not taking: Reported on 08/19/2015 08/17/15   Tanna Furry, MD  traZODone (DESYREL) 100 MG tablet Take 100 mg by mouth at bedtime. Reported on 09/02/2015    Historical Provider, MD  TRUEPLUS LANCETS 28G MISC 1 each by Does not apply  route every morning. Check sugar once daily, in the morning before eating or drinking. 06/26/15   Tresa Garter, MD  vitamin B-12 1000 MCG tablet Take 1 tablet (1,000 mcg total) by mouth daily. 05/30/15   Barton Dubois, MD   BP 142/85 mmHg  Pulse 102  Temp(Src) 98.9 F (37.2 C) (Oral)  Resp 20  Ht 6' 1"  (1.854 m)  Wt 300 lb (136.079 kg)  BMI 39.59 kg/m2  SpO2 100% Physical Exam  Constitutional: He is oriented to person, place, and time. He appears well-developed and well-nourished. No distress.  HENT:  Head: Normocephalic and atraumatic.  Mouth/Throat: Posterior oropharyngeal erythema present. No posterior oropharyngeal edema.  L tonsil is swollen with exudate and tonsilloliths. R tonsil also cryptic though no enlargement or exudate.  Eyes: Conjunctivae and EOM are normal.  Neck: Neck supple. No tracheal deviation present.  Cardiovascular: Normal rate.   Pulmonary/Chest: Effort normal. No respiratory distress.  Musculoskeletal: Normal range of motion.  Lymphadenopathy:    He has cervical adenopathy (L anterior).  Neurological: He is alert and oriented to person, place, and time.  Skin: Skin is warm and dry.  Psychiatric: He has a normal mood and affect. His behavior is normal.  Nursing note and vitals reviewed.  ED Course  Procedures (including critical care time) DIAGNOSTIC STUDIES: Oxygen Saturation is 100% on RA, normal by my interpretation.    COORDINATION OF CARE: 3:56 PM-Discussed treatment plan which includes strep screen with pt at bedside and pt agreed to plan.   Labs Review Labs Reviewed  RAPID STREP SCREEN (NOT AT [facility])  CULTURE, GROUP A STREP    Imaging Review No results found. I have personally reviewed and evaluated these images and lab results as part of my medical decision-making.   EKG Interpretation None      MDM   Final diagnoses:  Viral pharyngitis  Tonsillitis  Tonsillolith    Meets 3 of 4 CENTOR criteria. Will obtain rapid strep.  Ddx includes strep vs viral tonsillopharyngitis . Pt slightly tachycardic, though afebrile. Suspect pain and anxiety component. Will reassess.   Upon re-eval HR down to 86. EMR review reveals pt was seen in [facility] and given Amoxicillin empirically. I asked pt about this and he says he does have the abx and has started the course but has a hard time swallowing the large capsules. Rapid strep still pending. If negative pt can d/c course of amoxicillin. If positive will instruct pt to complete course.   Rapid strep negative. Will d/c amoxicillin. Encouraged NSAIDs and warm salt water gargles. ER return precautions given. PCP f/u encouraged. Discussed with pt if he continues to have chronic tonsillitis/pharyngitis he should consider ENT f/u.   I personally performed the services described in this documentation, which was scribed in my presence. The recorded information has been reviewed and is accurate.  Anne Ng, PA-C 09/09/15 3212  Fredia Sorrow, MD 09/09/15 [phone]

## 2015-09-08 NOTE — ED Notes (Signed)
Pt here with c/o swollen left tonsil. Pt reports sore throat and states "it hurts to breathe." Pt reports decreased oral intake.

## 2015-09-08 NOTE — Telephone Encounter (Signed)
Left HIPAA compliant voicemail that RN was returning patient call.

## 2015-09-08 NOTE — Discharge Instructions (Signed)
Your rapid strep was negative. STOP taking the amoxicillin. Please follow-up with your primary care provider. If you do not have one contact one of the clinics listed below. If you keep getting tonsillitis multiple times you might need a referral to an ENT doctor. Return to the ER for new or worsening symptoms.   Pharyngitis Pharyngitis is redness, pain, and swelling (inflammation) of your pharynx.  CAUSES  Pharyngitis is usually caused by infection. Most of the time, these infections are from viruses (viral) and are part of a cold. However, sometimes pharyngitis is caused by bacteria (bacterial). Pharyngitis can also be caused by allergies. Viral pharyngitis may be spread from person to person by coughing, sneezing, and personal items or utensils (cups, forks, spoons, toothbrushes). Bacterial pharyngitis may be spread from person to person by more intimate contact, such as kissing.  SIGNS AND SYMPTOMS  Symptoms of pharyngitis include:   Sore throat.   Tiredness (fatigue).   Low-grade fever.   Headache.  Joint pain and muscle aches.  Skin rashes.  Swollen lymph nodes.  Plaque-like film on throat or tonsils (often seen with bacterial pharyngitis). DIAGNOSIS  Your health care provider will ask you questions about your illness and your symptoms. Your medical history, along with a physical exam, is often all that is needed to diagnose pharyngitis. Sometimes, a rapid strep test is done. Other lab tests may also be done, depending on the suspected cause.  TREATMENT  Viral pharyngitis will usually get better in 3-4 days without the use of medicine. Bacterial pharyngitis is treated with medicines that kill germs (antibiotics).  HOME CARE INSTRUCTIONS   Drink enough water and fluids to keep your urine clear or pale yellow.   Only take over-the-counter or prescription medicines as directed by your health care provider:   If you are prescribed antibiotics, make sure you finish them even  if you start to feel better.   Do not take aspirin.   Get lots of rest.   Gargle with 8 oz of salt water ( tsp of salt per 1 qt of water) as often as every 1-2 hours to soothe your throat.   Throat lozenges (if you are not at risk for choking) or sprays may be used to soothe your throat. SEEK MEDICAL CARE IF:   You have large, tender lumps in your neck.  You have a rash.  You cough up green, yellow-brown, or bloody spit. SEEK IMMEDIATE MEDICAL CARE IF:   Your neck becomes stiff.  You drool or are unable to swallow liquids.  You vomit or are unable to keep medicines or liquids down.  You have severe pain that does not go away with the use of recommended medicines.  You have trouble breathing (not caused by a stuffy nose). MAKE SURE YOU:   Understand these instructions.  Will watch your condition.  Will get help right away if you are not doing well or get worse.   This information is not intended to replace advice given to you by your health care provider. Make sure you discuss any questions you have with your health care provider.   Document Released: 09/05/2005 Document Revised: 06/26/2013 Document Reviewed: 05/13/2013 Elsevier Interactive Patient Education [employer]2016 Elsevier Inc.

## 2015-09-08 NOTE — Telephone Encounter (Signed)
CMA called patient, patient verified name and DOB. Patient was inquiring about discount for the Neurologist and stated that they do not accept the orange card, but according to Arna Mediciora she stated they do, however, accept the [facility] discount. I let the patient know that the specialist accept the [facility] discount and per Arna Mediciora, it's a process and he should hear something shortly if he's approved or denied. Patient verbalized that he understood with no further questions.  Patient did mention to me that when he was here on 12/19 he was Dx with Acute pharyngitis, unspecified etiology. He think he may have been misdiagnosed. I informed the patient that [facility] and see how to proceed with patient concerns.

## 2015-09-08 NOTE — Telephone Encounter (Signed)
Patient called wanting to speak with nurse about recent diagnosis. Please follow up.

## 2015-09-11 LAB — CULTURE, GROUP A STREP: STREP A CULTURE: POSITIVE — AB

## 2015-09-12 ENCOUNTER — Telehealth (HOSPITAL_COMMUNITY): Payer: Self-pay

## 2015-09-12 NOTE — Telephone Encounter (Signed)
Post ED Visit - Positive Culture Follow-up: Successful Patient Follow-Up  Culture assessed and recommendations reviewed by: []  Enzo BiNathan Batchelder, Pharm.D. []  Celedonio MiyamotoJeremy Frens, [address] BCPS []  Garvin FilaMike Maccia, Pharm.D. []  Georgina PillionElizabeth Martin, Pharm.D., BCPS []  SnellingMinh Pham, [address]Ph[address] BCPS, AAHIVP []  Estella HuskMichelle Turner, Pharm.D., BCPS, AAHIVP []  Tennis Mustassie Stewart, Pharm.D. []  Sherle Poeob Vincent, [address] [facility] pharm d  Positive strep culture  [x]  Patient discharged without antimicrobial prescription and treatment is now indicated []  Organism is resistant to prescribed ED discharge antimicrobial []  Patient with positive blood cultures  Changes discussed with ED provider: Harolyn RutherfordShawn Joy PA New antibiotic prescription amoxicillin tid x 10 days.   Spoke with pt. Informed. States he already has full bottle of amoxicillin. He will start that today.     Ashley JacobsFesterman, Adrina Armijo C 09/12/2015, 10:23 AM

## 2015-10-07 ENCOUNTER — Ambulatory Visit: Payer: Self-pay | Admitting: Neurology

## 2015-10-09 ENCOUNTER — Encounter: Payer: Self-pay | Admitting: Family Medicine

## 2015-10-09 ENCOUNTER — Ambulatory Visit: Payer: Medicaid Other | Attending: Family Medicine | Admitting: Family Medicine

## 2015-10-09 VITALS — BP 125/79 | HR 94 | Temp 98.5°F | Resp 16 | Ht 73.0 in | Wt 317.0 lb

## 2015-10-09 DIAGNOSIS — F17210 Nicotine dependence, cigarettes, uncomplicated: Secondary | ICD-10-CM | POA: Insufficient documentation

## 2015-10-09 DIAGNOSIS — G709 Myoneural disorder, unspecified: Secondary | ICD-10-CM | POA: Insufficient documentation

## 2015-10-09 DIAGNOSIS — R209 Unspecified disturbances of skin sensation: Secondary | ICD-10-CM | POA: Diagnosis not present

## 2015-10-09 DIAGNOSIS — G729 Myopathy, unspecified: Secondary | ICD-10-CM | POA: Diagnosis not present

## 2015-10-09 DIAGNOSIS — Z887 Allergy status to serum and vaccine status: Secondary | ICD-10-CM | POA: Diagnosis not present

## 2015-10-09 DIAGNOSIS — Z886 Allergy status to analgesic agent status: Secondary | ICD-10-CM | POA: Diagnosis not present

## 2015-10-09 DIAGNOSIS — Z131 Encounter for screening for diabetes mellitus: Secondary | ICD-10-CM

## 2015-10-09 DIAGNOSIS — R569 Unspecified convulsions: Secondary | ICD-10-CM | POA: Insufficient documentation

## 2015-10-09 DIAGNOSIS — M549 Dorsalgia, unspecified: Secondary | ICD-10-CM | POA: Diagnosis not present

## 2015-10-09 DIAGNOSIS — F329 Major depressive disorder, single episode, unspecified: Secondary | ICD-10-CM | POA: Insufficient documentation

## 2015-10-09 DIAGNOSIS — M545 Low back pain, unspecified: Secondary | ICD-10-CM

## 2015-10-09 DIAGNOSIS — R531 Weakness: Secondary | ICD-10-CM | POA: Diagnosis not present

## 2015-10-09 DIAGNOSIS — J0391 Acute recurrent tonsillitis, unspecified: Secondary | ICD-10-CM

## 2015-10-09 DIAGNOSIS — E669 Obesity, unspecified: Secondary | ICD-10-CM

## 2015-10-09 LAB — POCT GLYCOSYLATED HEMOGLOBIN (HGB A1C): Hemoglobin A1C: 5.2

## 2015-10-09 MED ORDER — METHOCARBAMOL 750 MG PO TABS: 750.0000 mg | ORAL_TABLET | Freq: Two times a day (BID) | ORAL | Status: DC | PRN

## 2015-10-09 MED ORDER — LEVETIRACETAM 500 MG PO TABS: 500.0000 mg | ORAL_TABLET | Freq: Two times a day (BID) | ORAL | Status: DC

## 2015-10-09 NOTE — Patient Instructions (Signed)
Seizure, Adult °A seizure means there is unusual activity in the brain. A seizure can cause changes in attention or behavior. Seizures often cause shaking (convulsions). Seizures often last from 30 seconds to 2 minutes. °HOME CARE  °· If you are given medicines, take them exactly as told by your doctor. °· Keep all doctor visits as told. °· Do not swim or drive until your doctor says it is okay. °· Teach others what to do if you have a seizure. They should: °¨ Lay you on the ground. °¨ Put a cushion under your head. °¨ Loosen any tight clothing around your neck. °¨ Turn you on your side. °¨ Stay with you until you get better. °GET HELP RIGHT AWAY IF:  °· The seizure lasts longer than 2 to 5 minutes. °· The seizure is very bad. °· The person does not wake up after the seizure. °· The person's attention or behavior changes. °Drive the person to the emergency room or call your local emergency services (911 in [place] °MAKE SURE YOU:  °· Understand these instructions. °· Will watch your condition. °· Will get help right away if you are not doing well or get worse. °  °This information is not intended to replace advice given to you by your health care provider. Make sure you discuss any questions you have with your health care provider. °  °Document Released: 02/22/2008 Document Revised: 11/28/2011 Document Reviewed: 04/17/2013 °Elsevier Interactive Patient Education ©2016 Elsevier Inc. ° °

## 2015-10-09 NOTE — Progress Notes (Signed)
Patient's here for f/up back pain, referral for ENT.   Patient reports back pain and HA's, random dizzy spells. Rated at 9/10.  Patient states frequent episode of seizure.  Patient requesting refill of robaxin.

## 2015-10-09 NOTE — Progress Notes (Signed)
Subjective:    Patient ID: Donald Heck [name], male    DOB: 10/19/1987, 25 y.o.   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 He had complained of bilateral lower extremity weakness, lower extremity sensation loss, headaches and blurry vision and he underwent several workup including EMG, lumbar puncture, MRI, serum electrophoresis and viral causes of myelopathy explored, HTLV screen came back negative.   He was referred to Neurology for a follow up but informs me he was told they did not accept the orange card.He no longer uses a walker but his left leg still feels weak. In the last month he has had recurrent tonsillitis and strept pharyngitis with tonsillar hypertrophy and is requesting ENT referral. Also states he is having more frequent episodes of seizures- about once/week and typically partial with no loss of sphincteric function; his friend has been an eye witness. He has a history of infrequent seizures but has never been on medications.  Past Medical History  Diagnosis Date  . Medical history non-contributory   . Neuromuscular disorder (HCC)     GBS  . Depression 2012    History of self injury.  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 Nasal cauterization      Social History   Social History  . Marital Status: Single    Spouse Name: N/A  . Number of Children: N/A  . Years of Education: N/A   Occupational History  . Not on file.   Social History Main Topics  . Smoking status: Current Every Day Smoker -- 0.25 packs/day    Types: Cigarettes  . Smokeless tobacco: Never Used  . Alcohol Use: No  . Drug Use: Yes    Special: Marijuana  . Sexual Activity: Yes   Other Topics Concern    . Not on file   Social History Narrative    Allergies  Allergen Reactions  . Ibuprofen Other (See Comments)    Nose bleeds  . Fluzone  [Flu Virus Vaccine] Other (See Comments)    Patient with documented Guillain-Barre syndrome diagnosis    Current Outpatient Prescriptions on File Prior to Visit  Medication Sig Dispense Refill  . amoxicillin (AMOXIL) 500 MG capsule Take 1 capsule (500 mg total) by mouth 3 (three) times daily. 30 capsule 0  . Blood Glucose Monitoring Suppl (TRUE METRIX METER) W/DEVICE KIT 1 each by Does not apply route every morning. Check sugar once daily, in the morning before eating or drinking. 1 kit 0  . glucose blood (TRUE METRIX BLOOD GLUCOSE TEST) test strip Use as instructed, Check sugar once daily, in the morning before eating or drinking. 100 each 3  . Lurasidone HCl (LATUDA) 60 MG TABS Take 120 mg by mouth daily.     . QUEtiapine (SEROQUEL) 50 MG tablet Take 50 mg by mouth at bedtime.    . traZODone (DESYREL) 100 MG tablet Take 100 mg by mouth at bedtime. Reported on 09/02/2015    . TRUEPLUS LANCETS 28G MISC 1 each by Does not apply route every morning. Check sugar once daily, in the morning before eating or drinking. 100 each 3  . vitamin B-12 1000 MCG tablet Take 1 tablet (1,000 mcg total) by mouth daily.    Marland Kitchen  acetaminophen (TYLENOL) 325 MG tablet Take 2 tablets (650 mg total) by mouth every 6 (six) hours as needed for moderate pain or headache. (Patient not taking: Reported on 07/07/2015) 30 tablet 3  . buPROPion (WELLBUTRIN XL) 150 MG 24 hr tablet Take 1 tablet (150 mg total) by mouth daily. (Patient not taking: Reported on 07/07/2015) 30 tablet 1   No current facility-administered medications on file prior to visit.    Review of Systems  Constitutional: Negative for activity change and appetite change.  HENT: Negative for sinus pressure and sore throat.   Eyes: Negative for visual disturbance.  Respiratory: Negative for cough, chest tightness and  shortness of breath.   Cardiovascular: Negative for chest pain and leg swelling.  Gastrointestinal: Negative for abdominal pain, diarrhea, constipation and abdominal distention.  Endocrine: Negative.   Genitourinary: Negative for dysuria.  Musculoskeletal: Positive for back pain. Negative for myalgias and joint swelling.  Skin: Negative for rash.  Allergic/Immunologic: Negative.   Neurological: Positive for seizures and weakness. Negative for light-headedness and numbness.  Psychiatric/Behavioral: Negative for suicidal ideas and dysphoric mood.       Objective: Filed Vitals:   10/09/15 1027  BP: 125/79  Pulse: 94  Temp: 98.5 F (36.9 C)  TempSrc: Oral  Resp: 16  Height: 6' 1" (1.854 m)  Weight: 317 lb (143.79 kg)  SpO2: 97%      Physical Exam  Constitutional: He is oriented to person, place, and time. He appears well-developed and well-nourished.  Obese  Cardiovascular: Normal rate, normal heart sounds and intact distal pulses.   No murmur heard. Pulmonary/Chest: Effort normal and breath sounds normal. He has no wheezes. He has no rales. He exhibits no tenderness.  Abdominal: Soft. Bowel sounds are normal. He exhibits no distension and no mass. There is no tenderness.  Musculoskeletal: Normal range of motion.  Neurological: He is alert and oriented to person, place, and time.  LLE strength -3+/5, RLE strength- 5/5 Bilateral upper extremity strength-  5/5  Psychiatric: He has a normal mood and affect.          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   Depression: Currently on Latuda and trazodone. Advised to keep appointment with [facility]     Back pain: Controlled on Robaxin He is unable to afford tramadol  He has not had any lower back imaging and so this will be ordered if symptoms persist   This note has been created with Surveyor, quantity. Any transcriptional errors are unintentional.

## 2015-10-14 ENCOUNTER — Emergency Department (HOSPITAL_COMMUNITY)
Admission: EM | Admit: 2015-10-14 | Discharge: 2015-10-14 | Disposition: A | Discharge status: Home / Self Care | Payer: Medicaid Other | Attending: Emergency Medicine | Admitting: Emergency Medicine

## 2015-10-14 ENCOUNTER — Encounter (HOSPITAL_COMMUNITY): Payer: Self-pay | Admitting: *Deleted

## 2015-10-14 DIAGNOSIS — F17210 Nicotine dependence, cigarettes, uncomplicated: Secondary | ICD-10-CM | POA: Insufficient documentation

## 2015-10-14 DIAGNOSIS — M791 Myalgia: Secondary | ICD-10-CM | POA: Insufficient documentation

## 2015-10-14 NOTE — ED Notes (Signed)
The pt reports that he  gb syndrome since sept 2016   For 2 hours he has had back pain headache and lt leg heavy  He has not tried tylenol or advil

## 2015-10-14 NOTE — ED Notes (Signed)
That's guillain-barre syndrome

## 2015-10-14 NOTE — ED Notes (Signed)
Pt has left AMA. Advised him he should stay and he advised he was leaving because of the long wait time.

## 2015-10-26 ENCOUNTER — Telehealth: Payer: Self-pay | Admitting: Family Medicine

## 2015-10-26 NOTE — Telephone Encounter (Signed)
Called patient and verbally gave him list of affordable dentists.  Patient appreciative.

## 2015-10-26 NOTE — Telephone Encounter (Signed)
Pt. Called requesting a dentist referral. Please f/u with pt.

## 2015-10-27 ENCOUNTER — Ambulatory Visit: Payer: MEDICAID | Attending: Family Medicine

## 2015-11-20 ENCOUNTER — Observation Stay (HOSPITAL_COMMUNITY)
Admission: EM | Admit: 2015-11-20 | Discharge: 2015-11-21 | Disposition: A | Discharge status: Home / Self Care | Payer: Medicaid Other | Attending: Emergency Medicine | Admitting: Emergency Medicine

## 2015-11-20 ENCOUNTER — Encounter (HOSPITAL_COMMUNITY): Payer: Self-pay | Admitting: *Deleted

## 2015-11-20 ENCOUNTER — Ambulatory Visit: Payer: Medicaid Other | Attending: Family Medicine | Admitting: Family Medicine

## 2015-11-20 ENCOUNTER — Emergency Department (HOSPITAL_COMMUNITY): Payer: Medicaid Other

## 2015-11-20 ENCOUNTER — Encounter: Payer: Self-pay | Admitting: Family Medicine

## 2015-11-20 VITALS — BP 130/83 | HR 84 | Temp 98.9°F | Resp 15 | Ht 73.0 in | Wt 318.6 lb

## 2015-11-20 DIAGNOSIS — R569 Unspecified convulsions: Secondary | ICD-10-CM | POA: Diagnosis not present

## 2015-11-20 DIAGNOSIS — Z9889 Other specified postprocedural states: Secondary | ICD-10-CM | POA: Diagnosis not present

## 2015-11-20 DIAGNOSIS — R531 Weakness: Secondary | ICD-10-CM | POA: Diagnosis present

## 2015-11-20 DIAGNOSIS — Z79899 Other long term (current) drug therapy: Secondary | ICD-10-CM | POA: Insufficient documentation

## 2015-11-20 DIAGNOSIS — R209 Unspecified disturbances of skin sensation: Secondary | ICD-10-CM | POA: Insufficient documentation

## 2015-11-20 DIAGNOSIS — R200 Anesthesia of skin: Secondary | ICD-10-CM | POA: Diagnosis present

## 2015-11-20 DIAGNOSIS — G729 Myopathy, unspecified: Secondary | ICD-10-CM

## 2015-11-20 DIAGNOSIS — F17210 Nicotine dependence, cigarettes, uncomplicated: Secondary | ICD-10-CM | POA: Diagnosis not present

## 2015-11-20 DIAGNOSIS — R202 Paresthesia of skin: Secondary | ICD-10-CM

## 2015-11-20 DIAGNOSIS — R079 Chest pain, unspecified: Principal | ICD-10-CM | POA: Insufficient documentation

## 2015-11-20 DIAGNOSIS — F32A Depression, unspecified: Secondary | ICD-10-CM

## 2015-11-20 DIAGNOSIS — R0602 Shortness of breath: Secondary | ICD-10-CM | POA: Diagnosis not present

## 2015-11-20 DIAGNOSIS — F329 Major depressive disorder, single episode, unspecified: Secondary | ICD-10-CM | POA: Insufficient documentation

## 2015-11-20 DIAGNOSIS — Z888 Allergy status to other drugs, medicaments and biological substances status: Secondary | ICD-10-CM | POA: Insufficient documentation

## 2015-11-20 DIAGNOSIS — F449 Dissociative and conversion disorder, unspecified: Secondary | ICD-10-CM | POA: Diagnosis not present

## 2015-11-20 LAB — BASIC METABOLIC PANEL
Anion gap: 13 (ref 5–15)
BUN: 12 mg/dL (ref 6–20)
CALCIUM: 9.5 mg/dL (ref 8.9–10.3)
CHLORIDE: 102 mmol/L (ref 101–111)
CO2: 22 mmol/L (ref 22–32)
CREATININE: 0.99 mg/dL (ref 0.61–1.24)
GFR calc Af Amer: >60 mL/min (ref >60)
GFR calc non Af Amer: >60 mL/min (ref >60)
GLUCOSE: 148 mg/dL — AB (ref 65–99)
Potassium: 3.8 mmol/L (ref 3.5–5.1)
Sodium: 137 mmol/L (ref 135–145)

## 2015-11-20 LAB — CBC
HCT: 47.5 % (ref 39.0–52.0)
Hemoglobin: 16.4 g/dL (ref 13.0–17.0)
MCH: 30.8 pg (ref 26.0–34.0)
MCHC: 34.5 g/dL (ref 30.0–36.0)
MCV: 89.3 fL (ref 78.0–100.0)
PLATELETS: 326 10*3/uL (ref 150–400)
RBC: 5.32 MIL/uL (ref 4.22–5.81)
RDW: 13.1 % (ref 11.5–15.5)
WBC: 9.3 10*3/uL (ref 4.0–10.5)

## 2015-11-20 LAB — TROPONIN I

## 2015-11-20 NOTE — ED Notes (Signed)
The pt is c/o chest pain all day and for 2 days he has had more lt sided weakness than usual he has guillain-barre  Diagnosed in august last year.  He usually has weakness from that but this is more than usual

## 2015-11-20 NOTE — Progress Notes (Signed)
Reports no pain but left leg heaviness

## 2015-11-20 NOTE — Progress Notes (Signed)
Subjective:    Patient ID: Donald Heck [name], male    DOB: [birth year], 25 y.o.   MRN: [id number]  HPI 25 year old male with a history of depression who comes into the clinic today complaining of left leg weakness which he states is worsening He was referred to Neurology for a follow up but informs me he was told they did not accept the orange card.He no longer uses a walker but his left leg still feels weak.  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   He informs me today he was to follow-up with neurology at [facility] but then never did because he was concerned they would admit him but then he also states he has no way of transporting himself to [place] since he is limited in driving due to his seizures  Past Medical History  Diagnosis Date  . Medical history non-contributory   . Neuromuscular disorder (HCC)     GBS  . Depression 2012    History of self injury.  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 Nasal cauterization      Social History   Social History  . Marital Status: Single    Spouse Name: N/A  . Number of Children: N/A  . Years of Education: N/A   Occupational History  . Not on file.   Social History Main Topics  . Smoking status: Current Every Day Smoker -- 0.25 packs/day    Types: Cigarettes  . Smokeless tobacco: Never Used  . Alcohol Use: No  . Drug Use: Yes    Special: Marijuana  . Sexual Activity: Yes   Other Topics Concern  . Not on file   Social History Narrative    Allergies  Allergen Reactions  . Ibuprofen Other (See Comments)    Nose bleeds  .  Fluzone  [Flu Virus Vaccine] Other (See Comments)    Patient with documented Guillain-Barre syndrome diagnosis    Current Outpatient Prescriptions on File Prior to Visit  Medication Sig Dispense Refill  . Blood Glucose Monitoring Suppl (TRUE METRIX METER) W/DEVICE KIT 1 each by Does not apply route every morning. Check sugar once daily, in the morning before eating or drinking. 1 kit 0  . glucose blood (TRUE METRIX BLOOD GLUCOSE TEST) test strip Use as instructed, Check sugar once daily, in the morning before eating or drinking. 100 each 3  . levETIRAcetam (KEPPRA) 500 MG tablet Take 1 tablet (500 mg total) by mouth 2 (two) times daily. 60 tablet 1  . Lurasidone HCl (LATUDA) 60 MG TABS Take 120 mg by mouth daily.     . methocarbamol (ROBAXIN-750) 750 MG tablet Take 1 tablet (750 mg total) by mouth 2 (two) times daily as needed for muscle spasms. 60 tablet 2  . QUEtiapine (SEROQUEL) 50 MG tablet Take 50 mg by mouth at bedtime.    . traZODone (DESYREL) 100 MG tablet Take 100 mg by mouth at bedtime. Reported on 09/02/2015    . TRUEPLUS LANCETS 28G MISC 1 each by Does not apply route every morning. Check sugar once daily, in the morning before eating or drinking. 100 each  3  . vitamin B-12 1000 MCG tablet Take 1 tablet (1,000 mcg total) by mouth daily.    Marland Kitchen acetaminophen (TYLENOL) 325 MG tablet Take 2 tablets (650 mg total) by mouth every 6 (six) hours as needed for moderate pain or headache. (Patient not taking: Reported on 07/07/2015) 30 tablet 3  . buPROPion (WELLBUTRIN XL) 150 MG 24 hr tablet Take 1 tablet (150 mg total) by mouth daily. (Patient not taking: Reported on 07/07/2015) 30 tablet 1   No current facility-administered medications on file prior to visit.      Review of Systems Constitutional: Negative for activity change and appetite change.  HENT: Negative for sinus pressure and sore throat.   Eyes: Negative for visual disturbance.  Respiratory: Negative for cough, chest tightness  and shortness of breath.   Cardiovascular: Negative for chest pain and leg swelling.  Gastrointestinal: Negative for abdominal pain, diarrhea, constipation and abdominal distention.  Endocrine: Negative.   Genitourinary: Negative for dysuria.  Musculoskeletal: Negative for myalgias and joint swelling.  Skin: Negative for rash.  Allergic/Immunologic: Negative.   Neurological: Positive for seizures and weakness worse in the left leg. Negative for light-headedness and positive for numbness.  Psychiatric/Behavioral: Negative for suicidal ideas and dysphoric mood.      Objective: Filed Vitals:   11/20/15 1559  BP: 130/83  Pulse: 84  Temp: 98.9 F (37.2 C)  Resp: 15  Height: 6' 1"  (1.854 m)  Weight: 318 lb 9.6 oz (144.516 kg)  SpO2: 97%      Physical Exam Constitutional: He is oriented to person, place, and time. He appears well-developed and well-nourished.  Obese  Cardiovascular: Normal rate, normal heart sounds and intact distal pulses.   No murmur heard. Pulmonary/Chest: Effort normal and breath sounds normal. He has no wheezes. He has no rales. He exhibits no tenderness.  Abdominal: Soft. Bowel sounds are normal. He exhibits no distension and no mass. There is no tenderness.  Musculoskeletal: Normal range of motion.  Neurological: He is alert and oriented to person, place, and time.  LLE strength -3+/5, RLE strength- 5/5 LUE strength -3+/5, RUE strength-5/5 Dysesthesia in bilateral lower extremity Psychiatric: He has a normal mood and affect.        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 He has been unable to see neurology due to lack of medical coverage and has been encouraged again to  apply for the [place] discount to facilitate this referral  Seizures: Remains on Keppra  Will need EEG and Neuro evaluation and so has been referred to Neurology. Advised against driving   Depression: Currently on Latuda and trazodone. Advised to keep appointment with [facility]      This note has been created with Surveyor, quantity. Any transcriptional errors are unintentional.

## 2015-11-21 ENCOUNTER — Encounter (HOSPITAL_COMMUNITY): Payer: Self-pay | Admitting: Internal Medicine

## 2015-11-21 DIAGNOSIS — F449 Dissociative and conversion disorder, unspecified: Secondary | ICD-10-CM

## 2015-11-21 DIAGNOSIS — R079 Chest pain, unspecified: Secondary | ICD-10-CM

## 2015-11-21 NOTE — ED Notes (Signed)
Dr. Kirkpatrick at bedside 

## 2015-11-21 NOTE — Discharge Instructions (Signed)
Follow-up with PCP as soon as possible for weakness. If possible follow-up with [facility] neurology for continued workup If you would worsening weakness, chest pain return for ED evaluation

## 2015-11-21 NOTE — ED Provider Notes (Signed)
CSN: [id number]     Arrival date & time 11/20/15  2041 History   First MD Initiated Contact with Patient 11/21/15 0010     Chief Complaint  Patient presents with  . Chest Pain   HPI 25 year old male with history of Conversion Guillain-Barr syndrome presenting for bilateral lower extremity weakness worse on the right as well as right arm weakness.   Weakness started yesterday suddenly. He was seen by his PCP this afternoon who referred him to the emergency room.  He also reports squeezing type central chest pain associated with shortness of breath has started at 4 PM this afternoon. Pain not sharp in nature, nonradiating. He has had pain like this before when he was first diagnosed with Guillain-Barr syndrome in August 2016.   He received IVIG twice for his symptoms   Otherwise he denies recent illnesses, fevers, nausea, vomiting , diarrhea, headache, changes vision    Past Medical History  Diagnosis Date  . Medical history non-contributory   . Neuromuscular disorder (HCC)     GBS  . Depression 2012    History of self injury.  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 Nasal cauterization     Family History  Problem Relation Age of Onset  . Cancer Mother   . Stroke Maternal Grandmother    Social History  Substance Use Topics  . Smoking status: Current Every Day Smoker -- 0.25 packs/day    Types: Cigarettes  . Smokeless tobacco: Never Used  . Alcohol Use: No    Review of Systems  HENT: Negative.   Eyes: Negative.   Respiratory: Negative.   Cardiovascular: Positive for chest pain. Negative for palpitations and leg swelling.  Gastrointestinal: Negative.   Genitourinary: Negative.   Skin: Negative.   Neurological: Positive for weakness. Negative for dizziness, tremors, seizures, syncope, facial asymmetry, speech difficulty, light-headedness, numbness and headaches.      Allergies  Ibuprofen and Fluzone   Home Medications    Prior to Admission medications   Medication Sig Start Date End Date Taking? Authorizing Provider  acetaminophen (TYLENOL) 325 MG tablet Take 2 tablets (650 mg total) by mouth every 6 (six) hours as needed for moderate pain or headache. Patient not taking: Reported on 07/07/2015 05/18/15   Ripudeep Krystal Eaton, MD  Blood Glucose Monitoring Suppl (TRUE METRIX METER) W/DEVICE KIT 1 each by Does not apply route every morning. Check sugar once daily, in the morning before eating or drinking. 06/26/15   Tresa Garter, MD  buPROPion (WELLBUTRIN XL) 150 MG 24 hr tablet Take 1 tablet (150 mg total) by mouth daily. Patient not taking: Reported on 07/07/2015 05/18/15   Ripudeep Krystal Eaton, MD  glucose blood (TRUE METRIX BLOOD GLUCOSE TEST) test strip Use as instructed, Check sugar once daily, in the morning before eating or drinking. 06/26/15   Tresa Garter, MD  levETIRAcetam (KEPPRA) 500 MG tablet Take 1 tablet (500 mg total) by mouth 2 (two) times daily. 10/09/15   Arnoldo Morale, MD  Lurasidone HCl (LATUDA) 60 MG TABS Take 120 mg by mouth daily.     Historical Provider, MD  methocarbamol (ROBAXIN-750) 750 MG tablet Take 1 tablet (750 mg total) by mouth 2 (two) times daily as needed for muscle spasms. 10/09/15   Arnoldo Morale, MD  QUEtiapine (SEROQUEL) 50 MG tablet Take 50 mg by mouth at bedtime.    Historical Provider, MD  traZODone (DESYREL) 100 MG tablet Take 100 mg  by mouth at bedtime. Reported on 09/02/2015    Historical Provider, MD  TRUEPLUS LANCETS 28G MISC 1 each by Does not apply route every morning. Check sugar once daily, in the morning before eating or drinking. 06/26/15   Tresa Garter, MD  vitamin B-12 1000 MCG tablet Take 1 tablet (1,000 mcg total) by mouth daily. 05/30/15   Barton Dubois, MD   BP 95/76 mmHg  Pulse 80  Temp(Src) 98.5 F (36.9 C) (Oral)  Resp 16  SpO2 100% Physical Exam  Constitutional: He appears well-developed and well-nourished.  Eyes: EOM are normal. Pupils are  equal, round, and reactive to light.  Neck: Normal range of motion.  Cardiovascular: Normal rate and regular rhythm.   Pulmonary/Chest: Effort normal and breath sounds normal.  Abdominal: Soft. Bowel sounds are normal. He exhibits no distension and no mass. There is no tenderness. There is no rebound and no guarding.   Cranial Nerves II - XII - III, IV, VI - Extraocular movements intact. V - Facial sensation intact bilaterally. VII - Facial movement intact bilaterally. X - Palate elevates symmetrically, no dysarthria. XI - Chin turning & shoulder shrug intact bilaterally. XII - Tongue protrusion intact.  Motor Strength - The patient's strength was 4/5 in RU extremity, 4/5 in bilateral LE and pronator drift was absent. Bulk was normal and fasciculations were absent.  Motor Tone - Muscle tone was assessed at the neck and appendages and was normal.  Cerebellar: No ataxia on finger-nose-finger bilaterally  Gait and Station - deferred due to safety concerns.   ED Course  Procedures (including critical care time) Labs Review Labs Reviewed  BASIC METABOLIC PANEL - Abnormal; Notable for the following:    Glucose, Bld 148 (*)    All other components within normal limits  CBC  TROPONIN I    Imaging Review Dg Chest 2 View  11/20/2015  CLINICAL DATA:  25 year old male with chest pain x2 days. EXAM: CHEST  2 VIEW COMPARISON:  Radiograph dated 08/17/2015 FINDINGS: The lungs are hypovolemic. Mild diffuse interstitial prominence likely related to poor inspiratory effort. There is no focal consolidation, pleural effusion, or pneumothorax. The cardiac silhouette is within normal limits. No acute osseous pathology identified. IMPRESSION: No active cardiopulmonary disease. Electronically Signed   By: Anner Crete M.D.   On: 11/20/2015 21:36   I have personally reviewed and evaluated these images and lab results as part of my medical decision-making.   EKG Interpretation   Date/Time:   Friday November 20 2015 21:09:13 EST Ventricular Rate:  117 PR Interval:  130 QRS Duration: 90 QT Interval:  318 QTC Calculation: 443 R Axis:   87 Text Interpretation:  Sinus tachycardia Septal infarct , age undetermined  T wave abnormality, consider inferior ischemia Abnormal ECG No significant  change since last tracing Confirmed by Christy Gentles  MD, DONALD ([id number]) on  11/21/2015 12:13:05 AM      MDM   25 y/o M with presumed Guilain Barre syndrome however not corroborated by testing. He does however have conversion disorder which likely explain his symtoms. Neurology was called for consultation and reinterated that Mr Mackowski does not have Guilain Barre syndrome  and does not need treatment or admission for his symptoms He was counseled to follow up with his PCP. Return precautions were discussed  He stated that he plans to follow with [facility]   Shanequa Whitenight A. Lincoln Brigham MD, [facility] Resident PGY-2 Pager [phone]      Veatrice Bourbon, MD 11/21/15 [phone]  Ripley Fraise, MD 11/21/15 [phone]

## 2015-11-21 NOTE — H&P (Deleted)
[facility] History and Physical  Donald Mckay. 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 He reports that chest pain symptoms started yesterday(3/3) in the afternoon. Describes the pain as squeezing in nature. Associated symptoms included some shortness of breath. Denies any significant family history of early cardiac death or heart problems. Denies any recent trauma or strenuous activity. Patient also notes onset of worsening right lower extremity weakness/numbness as well as left-sided upper and lower extremity weakness/numbness over the last 2 days. Patient notes that he still able to ambulate and has not had any recent falls. Denies any recent antibiotics, sick contacts, or illness.  Upon admission patient was evaluated in EKG that appears similar to previous tracings and negative cardiac troponins.    Review of Systems  Constitutional: Negative for fever and chills.  HENT: Negative for ear discharge and ear pain.   Eyes: Negative for photophobia and pain.  Respiratory: Positive for shortness of breath. Negative for cough and sputum production.   Cardiovascular: Positive for chest pain. Negative for leg swelling.  Gastrointestinal: Positive for diarrhea. Negative for nausea, vomiting, abdominal pain and constipation.  Skin: Negative for itching and rash.  Neurological: Positive for sensory change, focal weakness and seizures.  Endo/Heme/Allergies: Negative for environmental allergies and polydipsia.       Past Medical History  Diagnosis Date  . Neuromuscular disorder (HCC)     GBS  . Depression 2012    History of self  injury.  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 Nasal cauterization        Social History:  reports that he has been smoking Cigarettes.  He has been smoking about 0.25 packs per day. He has never used smokeless tobacco. He reports that he uses illicit drugs (Marijuana). He reports that he does not drink alcohol.   Allergies  Allergen Reactions  . Ibuprofen Other (See Comments)    Nose bleeds  . Fluzone  [Flu Virus Vaccine] Other (See Comments)    Patient with documented Guillain-Barre syndrome diagnosis    Family History  Problem Relation Age of Onset  . Cancer Mother   . Stroke Maternal Grandmother        Prior to Admission medications   Medication Sig Start Date End Date Taking? Authorizing Provider  acetaminophen (TYLENOL) 325 MG tablet Take 2 tablets (650 mg total) by mouth every 6 (six) hours as needed for moderate pain or headache. Patient not taking: Reported on 07/07/2015 05/18/15   Ripudeep Krystal Eaton, MD  Blood Glucose Monitoring Suppl (TRUE METRIX METER) W/DEVICE KIT 1 each by Does not apply route every morning. Check sugar once daily, in the morning before eating or drinking. 06/26/15   Tresa Garter, MD  buPROPion (WELLBUTRIN XL) 150 MG 24 hr tablet Take 1 tablet (150 mg total) by mouth daily. Patient not taking: Reported on 07/07/2015 05/18/15   Ripudeep Krystal Eaton, MD  glucose blood (TRUE METRIX BLOOD GLUCOSE TEST) test strip Use as instructed, Check sugar once daily, in the morning before eating or drinking. 06/26/15   Tresa Garter, MD  levETIRAcetam (KEPPRA) 500 MG tablet Take 1  tablet (500 mg total) by mouth 2 (two) times daily. 10/09/15   Arnoldo Morale, MD  Lurasidone HCl (LATUDA) 60 MG TABS Take 120 mg by mouth daily.     Historical Provider, MD  methocarbamol (ROBAXIN-750) 750 MG tablet Take 1 tablet (750 mg total) by mouth 2 (two) times daily as needed for muscle spasms. 10/09/15   Arnoldo Morale, MD   QUEtiapine (SEROQUEL) 50 MG tablet Take 50 mg by mouth at bedtime.    Historical Provider, MD  traZODone (DESYREL) 100 MG tablet Take 100 mg by mouth at bedtime. Reported on 09/02/2015    Historical Provider, MD  TRUEPLUS LANCETS 28G MISC 1 each by Does not apply route every morning. Check sugar once daily, in the morning before eating or drinking. 06/26/15   Tresa Garter, MD  vitamin B-12 1000 MCG tablet Take 1 tablet (1,000 mcg total) by mouth daily. 05/30/15   Barton Dubois, MD     Physical Exam: Filed Vitals:   11/20/15 2111 11/21/15 0030 11/21/15 0100  BP: 145/117 95/76 116/84  Pulse: 113 80 74  Temp: 98.5 F (36.9 C)    TempSrc: Oral    Resp: _0 SpO2: 98% 100% 99%     Constitutional: Vital signs reviewed. Patient is a well-developed and well-nourished in no acute distress and cooperative with exam. Alert and oriented x3.  Head: Normocephalic and atraumatic  Ear: TM normal bilaterally  Mouth: no erythema or exudates, MMM  Eyes: PERRL, EOMI, conjunctivae normal, No scleral icterus.  Neck: Supple, Trachea midline normal ROM, No JVD, mass, thyromegaly, or carotid bruit present.  Cardiovascular: RRR, S1 normal, S2 normal, no MRG, pulses symmetric and intact bilaterally  Pulmonary/Chest: CTAB, no wheezes, rales, or rhonchi  Abdominal: Soft. Non-tender, non-distended, bowel sounds are normal, no masses, organomegaly, or guarding present.  GU: no CVA tenderness Musculoskeletal: No joint deformities, erythema, or stiffness, ROM full and no nontender Ext: no edema and no cyanosis, pulses palpable bilaterally (DP and PT)  Hematology: no cervical, inginal, or axillary adenopathy.  Neurological: A&O x3, Strenght is normal and symmetric bilaterally, cranial nerve II-XII are grossly intact, no focal motor deficit, sensory intact to light touch bilaterally.  Skin: Warm, dry and intact. No rash, cyanosis, or clubbing.  Psychiatric: Normal mood and affect. speech and behavior is  normal. Judgment and thought content normal. Cognition and memory are normal.      Data Review   Micro Results No results found for this or any previous visit (from the past 240 hour(s)).  Radiology Reports Dg Chest 2 View  11/20/2015  CLINICAL DATA:  25 year old male with chest pain x2 days. EXAM: CHEST  2 VIEW COMPARISON:  Radiograph dated 08/17/2015 FINDINGS: The lungs are hypovolemic. Mild diffuse interstitial prominence likely related to poor inspiratory effort. There is no focal consolidation, pleural effusion, or pneumothorax. The cardiac silhouette is within normal limits. No acute osseous pathology identified. IMPRESSION: No active cardiopulmonary disease. Electronically Signed   By: Anner Crete M.D.   On: 11/20/2015 21:36     CBC  Recent Labs Lab 11/20/15 2109  WBC 9.3  HGB 16.4  HCT 47.5  PLT 326  MCV 89.3  MCH 30.8  MCHC 34.5  RDW 13.1    Chemistries   Recent Labs Lab 11/20/15 2109  NA 137  K 3.8  CL 102  CO2 22  GLUCOSE 148*  BUN 12  CREATININE 0.99  CALCIUM 9.5   ------------------------------------------------------------------------------------------------------------------ estimated creatinine clearance is 172 mL/min (by C-G  formula based on Cr of 0.99). ------------------------------------------------------------------------------------------------------------------ No results for input(s): HGBA1C in the last 72 hours. ------------------------------------------------------------------------------------------------------------------ No results for input(s): CHOL, HDL, LDLCALC, TRIG, CHOLHDL, LDLDIRECT in the last 72 hours. ------------------------------------------------------------------------------------------------------------------ No results for input(s): TSH, T4TOTAL, T3FREE, THYROIDAB in the last 72 hours.  Invalid input(s):  FREET3 ------------------------------------------------------------------------------------------------------------------ No results for input(s): VITAMINB12, FOLATE, FERRITIN, TIBC, IRON, RETICCTPCT in the last 72 hours.  Coagulation profile No results for input(s): INR, PROTIME in the last 168 hours.  No results for input(s): DDIMER in the last 72 hours.  Cardiac Enzymes  Recent Labs Lab 11/20/15 2109  TROPONINI <0.03   ------------------------------------------------------------------------------------------------------------------ Invalid input(s): POCBNP   CBG: No results for input(s): GLUCAP in the last 168 hours.     EKG: Independently reviewed. Sinus tachycardia with signs of septal infarct T-wave abnormalities   Assessment/Plan Chest pain: Acute. - Admit to telemetry  - Cardiac troponins q 3 hours 3 - Repeat EKG in a.m. - Check lipid panel  -  +/- Ec  Seizure disorder: Patient reports last seizure occurring sometime last week - Continue Keppra - Seizure precautions  - Held trazodone as this can lower the patient's seizure threshold   Hyperglycemia: Mild. Blood glucose level CXLVI 8 on admission. Last hemoglobin A1c was noted to be 5.2 in 09/2015. - continue to monitor     1.   Code Status:   full Family Communication: bedside Disposition Plan: admit   Total time spent 55 minutes.Greater than 50% of this time was spent in counseling, explanation of diagnosis, planning of further management, and coordination of care  [facility] Pager [phone]  If 7PM-7AM, please contact night-coverage [email or web address] Password [email or web address] 11/21/2015, 1:53 AM

## 2015-11-21 NOTE — ED Notes (Signed)
Dr. Smith at bedside.

## 2015-11-21 NOTE — ED Notes (Signed)
Resident at bedside.  

## 2015-11-21 NOTE — Consult Note (Signed)
Neurology Consultation Reason for Consult: Numbness Referring Physician: Bebe Shaggy, D  CC: Weakness  History is obtained from:patient  HPI: Donald Mckay. is a 25 y.o. male with significant psychiatric history who presents with paresthesias involving predominantly the left arm and bilateral legs. He states that this began getting worse, that has been coming and going ever since last year.  He developed paresthesias and presented here where he was diagnosed with Guillain-Barr Mckay treated with IVIG in August 2016. He was diagnosed based on paresthesias and elevated CSF protein. It should be noted, however, that his tap was traumatic and therefore the CSF protein could've been artifactual. He had preserved reflexes throughout that time. He was discharged   He then felt like his symptoms were getting worse and therefore presented to back to the [facility] ER. Due to unavailability of EMG, he was transferred to [facility] where an extensive evaluation including EMG, LP, MRI, SSEPs, VEPs, antibody testing. He was ultimately diagnosed with conversion disorder and psychiatry was consulted.  He was seen again in November with concern for possible side effect of Topamax which had been started at [facility] for headache. This was discontinued.  ROS: A 14 point ROS was performed and is negative except as noted in the HPI.   Past Medical History  Diagnosis Date  . Neuromuscular disorder (HCC)     GBS  . Depression 2012    History of self injury.  Donald Mckay [facility])      Family History  Problem Relation Age of Onset  . Cancer Mother   . Stroke Maternal Grandmother      Social History:  reports that he has been smoking Cigarettes.  He has been smoking about 0.25 packs per day. He has never used smokeless tobacco. He reports that he uses illicit drugs (Marijuana). He reports that he does not drink alcohol.   Exam: Current vital signs: BP 116/84 mmHg   Pulse 74  Temp(Src) 98.5 F (36.9 C) (Oral)  Resp 19  SpO2 99% Vital signs in last 24 hours: Temp:  [98.5 F (36.9 C)-98.9 F (37.2 C)] 98.5 F (36.9 C) (03/03 2111) Pulse Rate:  [74-113] 74 (03/04 0100) Resp:  [15-19] 19 (03/04 0100) BP: (95-145)/(76-117) 116/84 mmHg (03/04 0100) SpO2:  [97 %-100 %] 99 % (03/04 0100) Weight:  [144.516 kg (318 lb 9.6 oz)] 144.516 kg (318 lb 9.6 oz) (03/03 1559)   Physical Exam  Constitutional: Appears well-developed and well-nourished.  Psych: Affect appropriate to situation Eyes: No scleral injection HENT: No OP obstrucion Head: Normocephalic.  Cardiovascular: Normal rate and regular rhythm.  Respiratory: Effort normal and breath sounds normal to anterior ascultation GI: Soft.  No distension. There is no tenderness.  Skin: WDI  Neuro: Mental Status: Patient is awake, alert, oriented to person, place, month, year, and situation. Patient is able to give a clear and coherent history. No signs of aphasia or neglect Cranial Nerves: II: Visual Fields are full. Pupils are equal, round, and reactive to light.   III,IV, VI: EOMI without ptosis or diploplia.  V: Facial sensation is symmetric to temperature VII: Facial movement is symmetric.  VIII: hearing is intact to voice X: Uvula elevates symmetrically XI: Shoulder shrug is symmetric. XII: tongue is midline without atrophy or fasciculations.  Motor: Tone is normal. Bulk is normal. He has give way weakness of both her left arm and leg, he has downward drift without pronation of the left arm Sensory: He has circumferential numbness  of the left arm which occurs abruptly just below the elbow, bilateral numbness of both lower extremities. Deep Tendon Reflexes: 2+ and symmetric in the BR, patellae and ankles.  Cerebellar: No ataxia on finger-nose-finger bilaterally   I have reviewed labs in epic and the results pertinent to this consultation are: BMP-unremarkable   Impression:  25 year old male with paresthesias that have been coming and going since a hospitalization in August. He was [facility] and I don't have any reason to disagree with this diagnosis based on my examination here today. He has give way weakness and a very unusual pattern of numbness. With intact reflexes, I do not think that this represents Guillain-Barr Mckay. I suspect that he did not in fact have GBS on his initial presentation either, with an elevated protein that I think was secondary to traumatic tap.  Recommendations: 1) no further workup or treatment needed at this time. I encouraged the patient to get psychotherapy as an outpatient to help identify any underlying stressors that might be contributing to conversion disorder. 2) please call if any further questions or concerns remain.   Ritta SlotMcNeill Parish Dubose, MD [facility] [phone]  If 7pm- 7am, please page neurology on call as listed in AMION.

## 2015-11-21 NOTE — ED Notes (Signed)
Pt is no longer getting admitted per Dr.Kirkpatrick

## 2015-11-23 ENCOUNTER — Telehealth: Payer: Self-pay | Admitting: Family Medicine

## 2015-11-23 NOTE — Telephone Encounter (Signed)
Pt. Called stating he went to the ED on 11/20/15 and he said it did not help much. Pt. Said he already spoke with his PCP [facility] being [facility] but pt. Would like yo know if he can go [facility] because it is closer. Please f/u with pt.

## 2015-11-24 ENCOUNTER — Telehealth: Payer: Self-pay | Admitting: *Deleted

## 2015-11-24 NOTE — Telephone Encounter (Signed)
Spoke to patient and per MD patient [facility]

## 2015-11-24 NOTE — Telephone Encounter (Signed)
Patient states [facility] and was not happy with is care in the ED.  He left before being seen.  Patient needs advice.  Per MD [facility].  Patient agreed

## 2015-12-01 ENCOUNTER — Ambulatory Visit: Payer: Medicaid Other | Attending: Family Medicine | Admitting: Family Medicine

## 2015-12-01 ENCOUNTER — Encounter: Payer: Self-pay | Admitting: Family Medicine

## 2015-12-01 VITALS — BP 135/81 | HR 94 | Temp 99.1°F | Resp 15 | Ht 73.0 in | Wt 320.0 lb

## 2015-12-01 DIAGNOSIS — R5383 Other fatigue: Secondary | ICD-10-CM

## 2015-12-01 DIAGNOSIS — G729 Myopathy, unspecified: Secondary | ICD-10-CM

## 2015-12-01 DIAGNOSIS — Z6841 Body Mass Index (BMI) 40.0 and over, adult: Secondary | ICD-10-CM | POA: Insufficient documentation

## 2015-12-01 DIAGNOSIS — G8929 Other chronic pain: Secondary | ICD-10-CM

## 2015-12-01 DIAGNOSIS — R209 Unspecified disturbances of skin sensation: Secondary | ICD-10-CM | POA: Insufficient documentation

## 2015-12-01 DIAGNOSIS — R51 Headache: Secondary | ICD-10-CM | POA: Diagnosis not present

## 2015-12-01 DIAGNOSIS — R569 Unspecified convulsions: Secondary | ICD-10-CM

## 2015-12-01 DIAGNOSIS — R531 Weakness: Secondary | ICD-10-CM | POA: Diagnosis not present

## 2015-12-01 DIAGNOSIS — Z79899 Other long term (current) drug therapy: Secondary | ICD-10-CM | POA: Diagnosis not present

## 2015-12-01 DIAGNOSIS — F329 Major depressive disorder, single episode, unspecified: Secondary | ICD-10-CM | POA: Insufficient documentation

## 2015-12-01 DIAGNOSIS — R202 Paresthesia of skin: Secondary | ICD-10-CM

## 2015-12-01 DIAGNOSIS — E669 Obesity, unspecified: Secondary | ICD-10-CM | POA: Diagnosis not present

## 2015-12-01 LAB — T4, FREE: Free T4: 1.1 ng/dL (ref 0.8–1.8)

## 2015-12-01 LAB — TSH: TSH: 2.33 mIU/L (ref 0.40–4.50)

## 2015-12-01 LAB — T3, FREE: T3 FREE: 3.1 pg/mL (ref 2.3–4.2)

## 2015-12-01 LAB — VITAMIN B12: Vitamin B-12: 828 pg/mL (ref 200–1100)

## 2015-12-01 MED ORDER — LEVETIRACETAM 500 MG PO TABS: 500.0000 mg | ORAL_TABLET | Freq: Two times a day (BID) | ORAL | Status: DC

## 2015-12-01 MED ORDER — CYANOCOBALAMIN 1000 MCG/ML IJ SOLN: 1000.0000 ug | INTRAMUSCULAR | Status: DC

## 2015-12-01 NOTE — Progress Notes (Signed)
[facility] Started him on gabapentin and patient states they want him to do vitamin b12 injections He has had a headache for 2 weeks straight and states the only thing that helps is smoking marijuana Needs refill on Keppra

## 2015-12-01 NOTE — Progress Notes (Signed)
Subjective:  Patient ID: Donald CarnesWilliam Wayde Cathleen Cortischaak Jr., male    DOB: 1991-02-05  Age: 25 y.o. MRN: [id number]  CC: Follow-up   HPI Donald CarnesWilliam Wayde Stockman Montez HagemanJr. is a 25 year old male with a history of depression, previous history of Guillain-Barr syndrome (previously treated with IVIG and [facility]; also managed [facility] where he underwent several workup including EMG, lumbar puncture, MRI, serum electrophoresis and viral causes of myelopathy explored, HTLV screen came back negative) who comes into the clinic for follow-up visit. He has been unable to see neurology due to lack of medical coverage despite his persisting lower extremity weakness and paresthesia.  Since his last office visit he has presented [facility] and [facility] where he did not receive a favorable treatment as per the patient which led to subsequent presentation [facility] and he was seen by neurology and diagnosed with " chronic Guillain-Barr syndrome". He was commenced on gabapentin and vitamin B12 injections were recommended and scheduled for follow-up with neurology in 12/2015. Today he informs me that he has weakness of his lower extremities more on the left and now has numbness in both upper extremities and lower extremities. He previously ambulated without an assistive device at his last visit but now is in a wheelchair. He continues to have chronic headaches and this is relieved by taking marijuana; tried Topamax in the past but this was discontinued by one of his previous neurologist at an ED visit. Denies nausea or vomiting.  He remains on Keppra for seizures and has not had any recent seizures. He complains of fatigue even with exertion; most recent TSH was from 05/2015 which was mildly elevated. 4.871.  Outpatient Prescriptions Prior to Visit  Medication Sig Dispense Refill  . glucose blood (TRUE METRIX BLOOD GLUCOSE TEST) test strip Use as instructed, Check sugar  once daily, in the morning before eating or drinking. 100 each 3  . Lurasidone HCl (LATUDA) 60 MG TABS Take 120 mg by mouth daily.     . methocarbamol (ROBAXIN-750) 750 MG tablet Take 1 tablet (750 mg total) by mouth 2 (two) times daily as needed for muscle spasms. 60 tablet 2  . QUEtiapine (SEROQUEL) 50 MG tablet Take 50 mg by mouth at bedtime.    . traZODone (DESYREL) 100 MG tablet Take 100 mg by mouth at bedtime. Reported on 09/02/2015    . TRUEPLUS LANCETS 28G MISC 1 each by Does not apply route every morning. Check sugar once daily, in the morning before eating or drinking. 100 each 3  . levETIRAcetam (KEPPRA) 500 MG tablet Take 1 tablet (500 mg total) by mouth 2 (two) times daily. 60 tablet 1   No facility-administered medications prior to visit.    ROS Review of Systems Constitutional: Negative for activity change and appetite change.  positive for fatigue. HENT: Negative for sinus pressure and sore throat.   Eyes: Negative for visual disturbance.  Respiratory: Negative for cough, chest tightness and shortness of breath.   Cardiovascular: Negative for chest pain and leg swelling.  Gastrointestinal: Negative for abdominal pain, diarrhea, constipation and abdominal distention.  Endocrine: Negative.   Genitourinary: Negative for dysuria.  Musculoskeletal: Negative for myalgias and joint swelling.  Skin: Negative for rash.  Allergic/Immunologic: Negative.   Neurological: Positive for seizures and weakness worse in the left leg. Negative for light-headedness and positive for numbness.  Psychiatric/Behavioral: Negative for suicidal ideas and dysphoric mood.      Objective:  BP 135/81  mmHg  Pulse 94  Temp(Src) 99.1 F (37.3 C)  Resp 15  Ht  (1.854 m)  Wt 320 lb (145.151 kg)  BMI 42.23 kg/m2  SpO2 97%  BP/Weight 12/01/2015 11/21/2015 11/20/2015  Systolic BP 135 114 130  Diastolic BP 81 75 83  Wt. (Lbs) 320 - 318.6  BMI 42.23 - 42.04       Physical Exam Constitutional:  He is oriented to person, place, and time. He appears well-developed and well-nourished.  Obese  Cardiovascular: Normal rate, normal heart sounds and intact distal pulses.   No murmur heard. Pulmonary/Chest: Effort normal and breath sounds normal. He has no wheezes. He has no rales. He exhibits no tenderness.  Abdominal: Soft. Bowel sounds are normal. He exhibits no distension and no mass. There is no tenderness.  Musculoskeletal: Normal range of motion.  Neurological: He is alert and oriented to person, place, and time.  LLE strength -3+/5, RLE strength- 5/5 LUE strength -3+/5, RUE strength-5/5 Dysesthesia in bilateral lower extremity Psychiatric: He has a normal mood and affect.   Assessment & Plan:   1. Seizures (HCC) No recent seizures - levETIRAcetam (KEPPRA) 500 MG tablet; Take 1 tablet (500 mg total) by mouth 2 (two) times daily.  Dispense: 60 tablet; Refill: 1  2. 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 Other fatigue Unknown etiology. We'll send off labs - TSH - T4, free - T3, Free - Vitamin D, 25-hydroxy  5. Paresthesia Currently on gabapentin B12 shots recommended by neurology from [facility] - Vitamin B12  6. Chronic nonintractable headache, unspecified headache type He was previously on Topamax which controlled his headaches but this was discontinued by one of the neurologists at one of his ED visits and I am reluctant to place him back on it. I have advised him to discuss this with his neurologist as his upcoming appointment   Meds ordered this encounter  Medications  . levETIRAcetam (KEPPRA) 500 MG tablet    Sig: Take 1 tablet (500 mg total) by mouth 2 (two) times daily.    Dispense:  60 tablet    Refill:  1  . cyanocobalamin (,VITAMIN B-12,) 1000 MCG/ML  injection    Sig: Inject 1 mL (1,000 mcg total) into the muscle every 30 (thirty) days.    Dispense:  1 mL    Refill:  3    Follow-up: Return in about 2 months (around 01/31/2016) for follow up on fatigue.   Jaclyn Shaggy MD

## 2015-12-02 ENCOUNTER — Other Ambulatory Visit: Payer: Self-pay | Admitting: Family Medicine

## 2015-12-02 DIAGNOSIS — E559 Vitamin D deficiency, unspecified: Secondary | ICD-10-CM

## 2015-12-02 LAB — VITAMIN D 25 HYDROXY (VIT D DEFICIENCY, FRACTURES): Vit D, 25-Hydroxy: 15 ng/mL — ABNORMAL LOW (ref 30–100)

## 2015-12-02 MED ORDER — ERGOCALCIFEROL 1.25 MG (50000 UT) PO CAPS: 50000.0000 [IU] | ORAL_CAPSULE | ORAL | Status: DC

## 2015-12-16 ENCOUNTER — Telehealth: Payer: Self-pay | Admitting: Family Medicine

## 2015-12-16 NOTE — Telephone Encounter (Signed)
Patient is calling regarding B12 injections....Donald Mckay.Donald Mckay.patient states his PCP would contact him regarding when he could start the shots...either home or at our facility....  Patient states he has fallen 4 times...Donald Mckay.Donald Mckay.  Please follow up with patient

## 2015-12-16 NOTE — Telephone Encounter (Signed)
His medication is at the pharmacy and all he needs to do is to pickup the B12 and schedule a nurse visit to receive his monthly shots. Regarding his falling he needs to notify his neurologist who will be managing his Alene MiresGuillain Barre of his worsening symptoms; he might need a walker.

## 2015-12-21 NOTE — Telephone Encounter (Signed)
Spoke to patient and verified name and date of birth.  Gave instructions and transferred patient to schedulers to make RN appointment for his first shot.

## 2015-12-23 ENCOUNTER — Telehealth: Payer: Self-pay | Admitting: *Deleted

## 2015-12-23 ENCOUNTER — Ambulatory Visit: Payer: Self-pay

## 2015-12-23 MED ORDER — CYANOCOBALAMIN 1000 MCG/ML IJ SOLN: 1000.0000 ug | INTRAMUSCULAR | Status: DC

## 2015-12-24 ENCOUNTER — Ambulatory Visit: Payer: Medicaid Other | Attending: Family Medicine

## 2015-12-24 VITALS — Temp 98.6°F

## 2015-12-24 DIAGNOSIS — E539 Vitamin B deficiency, unspecified: Secondary | ICD-10-CM | POA: Diagnosis not present

## 2015-12-24 MED ORDER — CYANOCOBALAMIN 1000 MCG/ML IJ SOLN
1000.0000 ug | Freq: Once | INTRAMUSCULAR | Status: AC
  Administered 2015-12-24: 1000 ug via INTRAMUSCULAR

## 2015-12-24 MED ORDER — CYANOCOBALAMIN 1000 MCG/ML IJ KIT
1.0000 mL | PACK | Freq: Once | INTRAMUSCULAR | Status: AC
  Administered 2016-02-10: 1 mL via INTRAMUSCULAR

## 2015-12-24 NOTE — Progress Notes (Signed)
Patient's here for B12 injection.   Patient tolerated injection well.  Patient is due for next injection 12/31/2015.

## 2015-12-24 NOTE — Patient Instructions (Signed)
Patient is to return on 12/31/2015

## 2015-12-28 ENCOUNTER — Ambulatory Visit: Payer: Self-pay

## 2016-02-05 IMAGING — CR DG CHEST 2V
2 series · 2 of 2 positions shown · non-contrast
Comparison: Chest x-ray 05/24/2015.

CLINICAL DATA: 24-year-old male with chest pain.

EXAM:
CHEST  2 VIEW

[w chest pa]
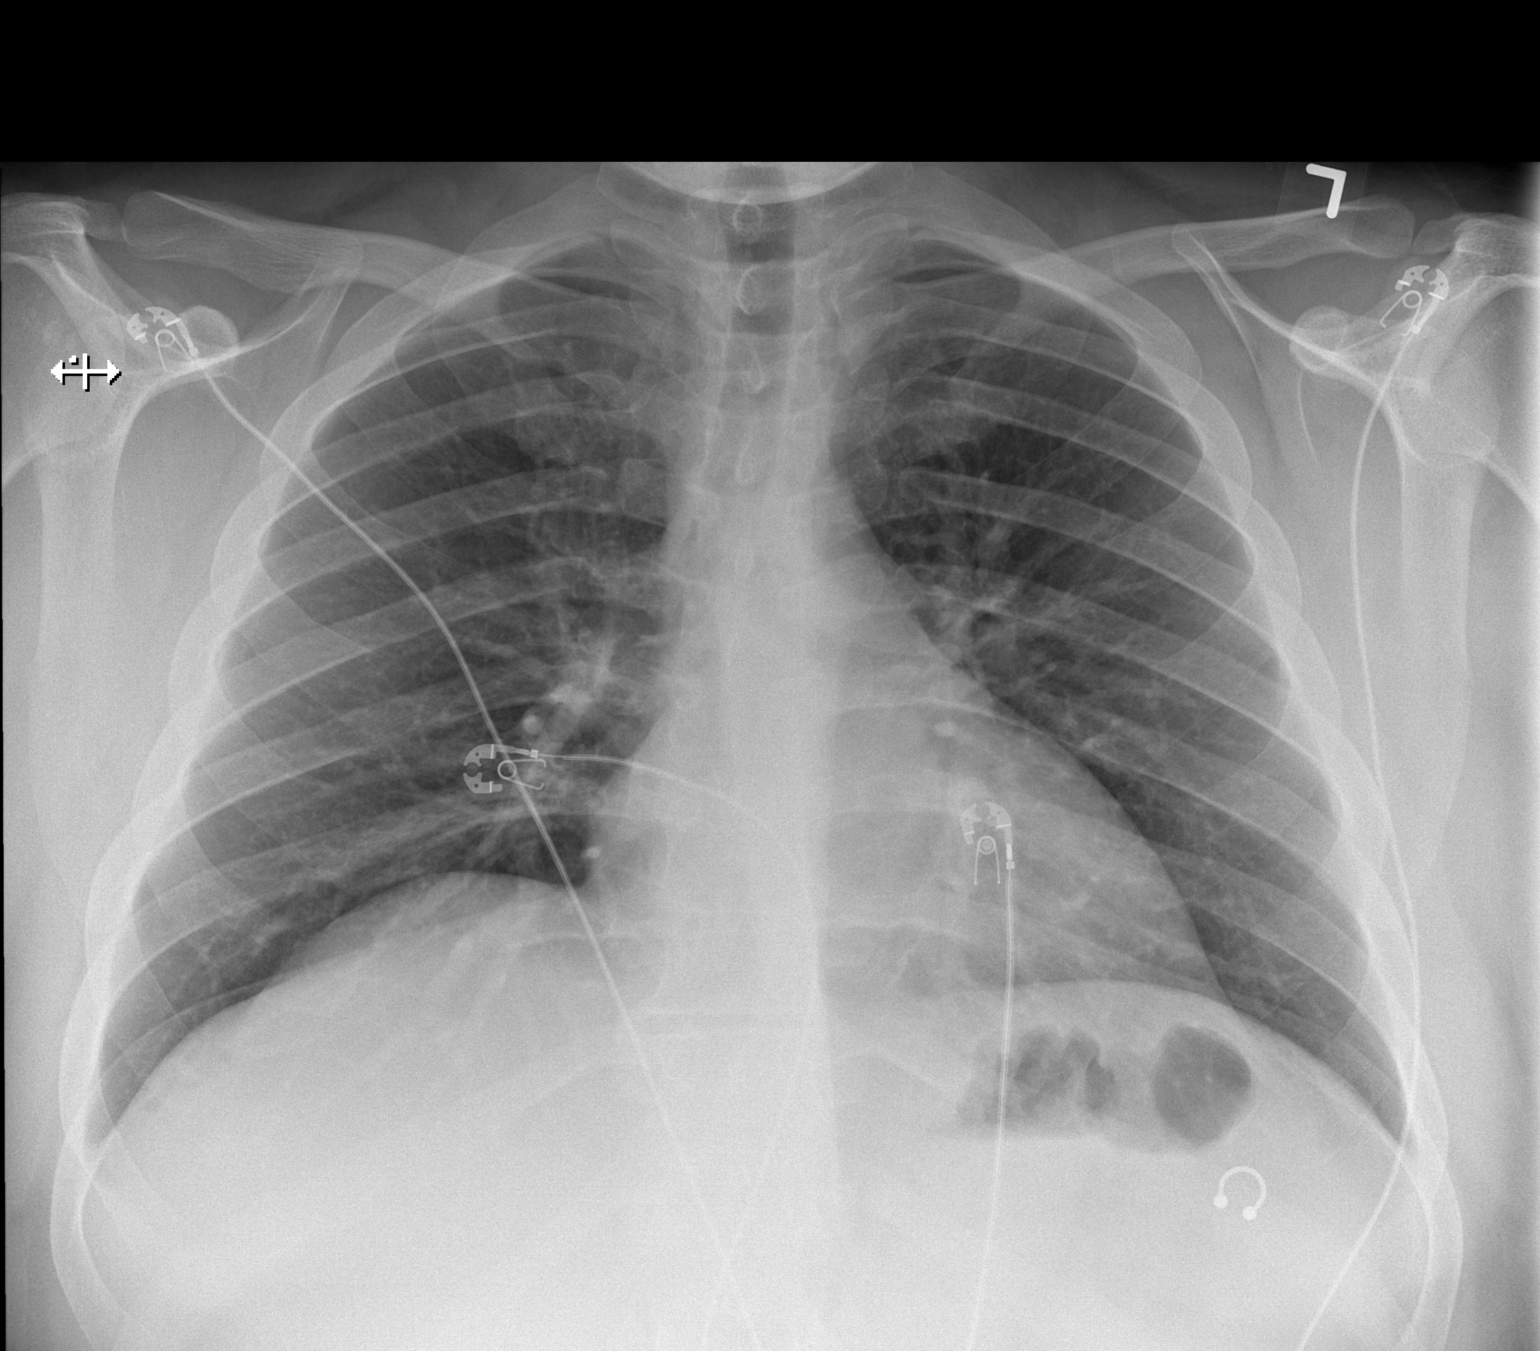

[w chest lat]
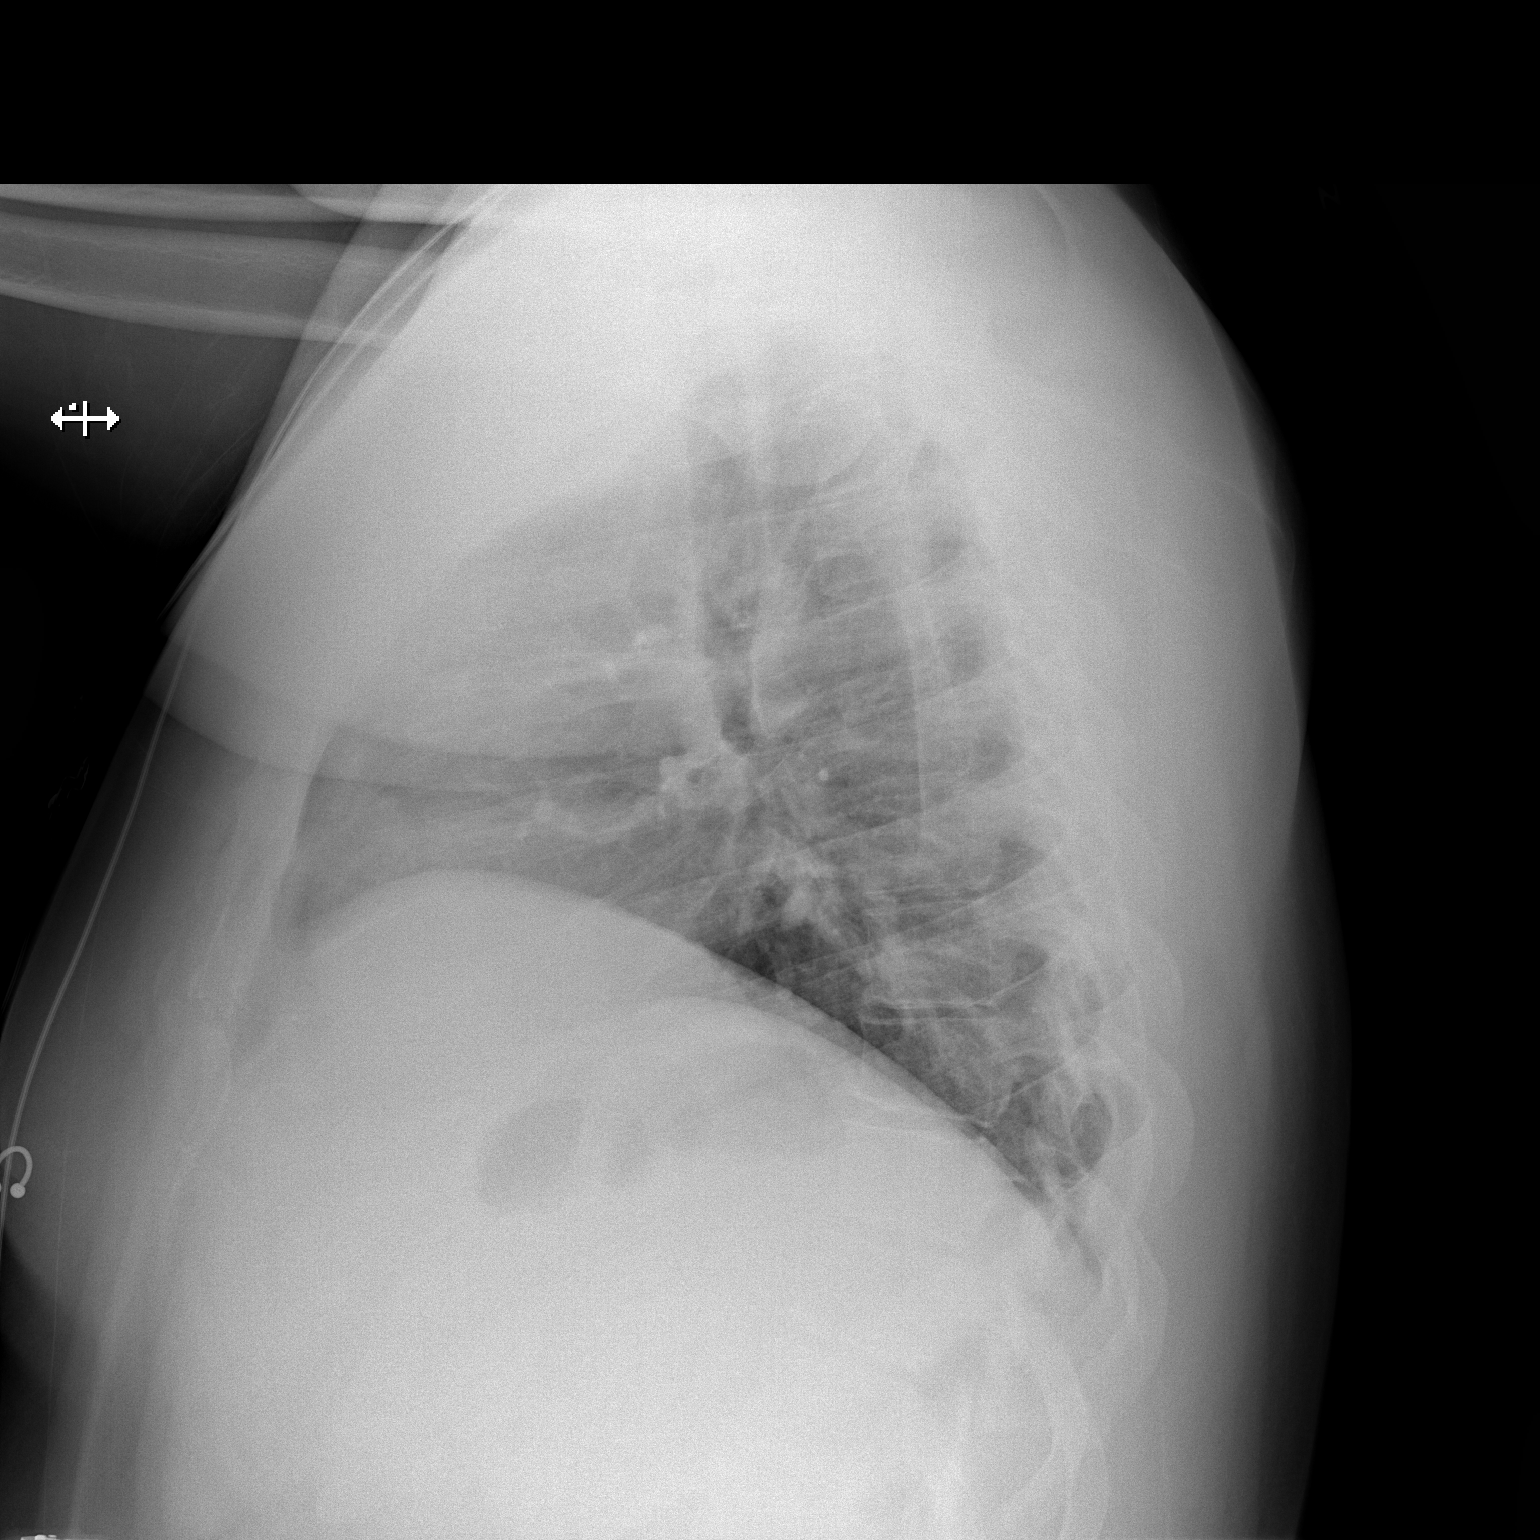

[2 of 2 positions shown; findings below may reference images not displayed]

FINDINGS: Lung volumes are normal. No consolidative airspace disease. No
pleural effusions. No pneumothorax. No pulmonary nodule or mass
noted. Pulmonary vasculature and the cardiomediastinal silhouette
are within normal limits. Axial No radiographic evidence of acute
cardiopulmonary disease. Left-sided nipple ring incidentally noted.
IMPRESSION: No active cardiopulmonary disease.

## 2016-02-08 ENCOUNTER — Telehealth: Payer: Self-pay

## 2016-02-08 ENCOUNTER — Ambulatory Visit: Payer: Medicaid Other | Attending: Family Medicine

## 2016-02-08 MED ORDER — CYANOCOBALAMIN 1000 MCG/ML IJ SOLN: 1000.0000 ug | INTRAMUSCULAR | Status: DC

## 2016-02-08 NOTE — Telephone Encounter (Signed)
Please send to [facility].

## 2016-02-08 NOTE — Progress Notes (Unsigned)
Patient was here for B12 injection. B12 was not available for patient.  Patient will reschedule next Tuesday, 30th, 2017.

## 2016-02-08 NOTE — Telephone Encounter (Signed)
Done

## 2016-02-08 NOTE — Telephone Encounter (Signed)
Patient had an appt today, 02/08/2016 for his B12 injection. Pharmacy did not have it in stock, so it needs to ordered. Patient will make an appt for next Tuesday, 29th, 2017 for his injection.  Please order it through Outpatient pharmacy.

## 2016-02-09 NOTE — Telephone Encounter (Signed)
Opened in error

## 2016-02-10 ENCOUNTER — Ambulatory Visit: Payer: Medicaid Other | Attending: Family Medicine | Admitting: *Deleted

## 2016-02-10 ENCOUNTER — Telehealth: Payer: Self-pay | Admitting: Family Medicine

## 2016-02-10 DIAGNOSIS — E538 Deficiency of other specified B group vitamins: Secondary | ICD-10-CM | POA: Insufficient documentation

## 2016-02-10 NOTE — Telephone Encounter (Signed)
Pt. Called stating that he called [facility] and they stated they did not have the cyanocobalamin (,VITAMIN B-12,) 1000 MCG/ML injection.  Pt. Is upset b/c he has not had the injection in over a month. Please f/u with pt. ASAP

## 2016-02-10 NOTE — Patient Instructions (Signed)
Patient has appointment to return on next week at 2pm

## 2016-02-17 ENCOUNTER — Ambulatory Visit: Payer: Self-pay

## 2016-02-22 ENCOUNTER — Telehealth: Payer: Self-pay | Admitting: Family Medicine

## 2016-02-22 ENCOUNTER — Ambulatory Visit: Payer: Medicaid Other | Attending: Family Medicine | Admitting: *Deleted

## 2016-02-22 DIAGNOSIS — E538 Deficiency of other specified B group vitamins: Secondary | ICD-10-CM | POA: Diagnosis not present

## 2016-02-22 DIAGNOSIS — R202 Paresthesia of skin: Secondary | ICD-10-CM | POA: Diagnosis not present

## 2016-02-22 MED ORDER — GLUCOSE BLOOD VI STRP: ORAL_STRIP | Status: DC

## 2016-02-22 MED ORDER — CYANOCOBALAMIN 1000 MCG/ML IJ SOLN: 1000.0000 ug | Freq: Once | INTRAMUSCULAR | Status: DC

## 2016-02-22 NOTE — Telephone Encounter (Signed)
Test strips have been refilled.

## 2016-02-22 NOTE — Progress Notes (Signed)
Patient tolerated injection well today 

## 2016-02-22 NOTE — Patient Instructions (Signed)
Patient scheduled next injection for next week.

## 2016-02-22 NOTE — Telephone Encounter (Signed)
Patient is requesting test strips.

## 2016-02-24 ENCOUNTER — Emergency Department (HOSPITAL_COMMUNITY)
Admission: EM | Admit: 2016-02-24 | Discharge: 2016-02-24 | Discharge status: Against Medical Advice | Payer: Medicaid Other | Attending: Dermatology | Admitting: Dermatology

## 2016-02-24 DIAGNOSIS — F17210 Nicotine dependence, cigarettes, uncomplicated: Secondary | ICD-10-CM | POA: Diagnosis not present

## 2016-02-24 DIAGNOSIS — R200 Anesthesia of skin: Secondary | ICD-10-CM | POA: Insufficient documentation

## 2016-02-24 DIAGNOSIS — R51 Headache: Secondary | ICD-10-CM | POA: Insufficient documentation

## 2016-02-24 DIAGNOSIS — Z5321 Procedure and treatment not carried out due to patient leaving prior to being seen by health care provider: Secondary | ICD-10-CM | POA: Insufficient documentation

## 2016-02-24 DIAGNOSIS — R509 Fever, unspecified: Secondary | ICD-10-CM | POA: Diagnosis not present

## 2016-02-24 NOTE — ED Notes (Signed)
Pt was informed by RN that he would be the next to get a room.  Pt decided that he wanted to leave and see his urologist in the morning.

## 2016-02-24 NOTE — ED Notes (Signed)
Pt has a hx of guillains Barre Syndrome and about three days ago he developed a headache, fever, has fallen and while waiting in [facility] his left arm went numb

## 2016-02-25 ENCOUNTER — Telehealth: Payer: Self-pay | Admitting: Family Medicine

## 2016-02-25 NOTE — Telephone Encounter (Signed)
His vitamin D prescription was for duration of 2 months. Refill only indicated if repeat vitamin D levels are still low.

## 2016-02-26 NOTE — Telephone Encounter (Signed)
Patient has not repeated his vitamin d levels.

## 2016-02-29 ENCOUNTER — Ambulatory Visit: Payer: Medicaid Other | Attending: Family Medicine

## 2016-02-29 DIAGNOSIS — E538 Deficiency of other specified B group vitamins: Secondary | ICD-10-CM

## 2016-02-29 MED ORDER — CYANOCOBALAMIN 1000 MCG/ML IJ SOLN
1000.0000 ug | Freq: Once | INTRAMUSCULAR | Status: AC
  Administered 2016-02-29: 1000 ug via INTRAMUSCULAR

## 2016-02-29 MED ORDER — CYANOCOBALAMIN 1000 MCG/ML IJ SOLN: 1000.0000 ug | Freq: Once | INTRAMUSCULAR | Status: DC

## 2016-02-29 NOTE — Progress Notes (Signed)
Patient to schedule next B12 injection for one week.-K.B RN

## 2016-03-03 NOTE — Telephone Encounter (Signed)
Patient receives injections in the office. Patient was seen Monday 02/29/16 for most recent injection.

## 2016-03-07 ENCOUNTER — Ambulatory Visit: Payer: Self-pay

## 2016-03-07 ENCOUNTER — Ambulatory Visit: Payer: Medicaid Other | Attending: Family Medicine

## 2016-03-07 DIAGNOSIS — E538 Deficiency of other specified B group vitamins: Secondary | ICD-10-CM | POA: Diagnosis present

## 2016-03-07 MED ORDER — CYANOCOBALAMIN 1000 MCG/ML IJ SOLN
1000.0000 ug | Freq: Once | INTRAMUSCULAR | Status: AC
  Administered 2016-03-07: 1000 ug via INTRAMUSCULAR

## 2016-03-14 ENCOUNTER — Encounter: Payer: Self-pay | Admitting: Family Medicine

## 2016-03-14 ENCOUNTER — Ambulatory Visit: Payer: Medicaid Other | Attending: Family Medicine | Admitting: Family Medicine

## 2016-03-14 VITALS — BP 139/80 | HR 96 | Temp 98.6°F | Resp 14 | Ht 73.0 in | Wt 326.6 lb

## 2016-03-14 DIAGNOSIS — E669 Obesity, unspecified: Secondary | ICD-10-CM

## 2016-03-14 DIAGNOSIS — R569 Unspecified convulsions: Secondary | ICD-10-CM

## 2016-03-14 DIAGNOSIS — K2970 Gastritis, unspecified, without bleeding: Secondary | ICD-10-CM

## 2016-03-14 DIAGNOSIS — M545 Low back pain, unspecified: Secondary | ICD-10-CM

## 2016-03-14 DIAGNOSIS — G729 Myopathy, unspecified: Secondary | ICD-10-CM

## 2016-03-14 DIAGNOSIS — K2990 Gastroduodenitis, unspecified, without bleeding: Secondary | ICD-10-CM

## 2016-03-14 DIAGNOSIS — F329 Major depressive disorder, single episode, unspecified: Secondary | ICD-10-CM

## 2016-03-14 DIAGNOSIS — F32A Depression, unspecified: Secondary | ICD-10-CM

## 2016-03-14 DIAGNOSIS — J302 Other seasonal allergic rhinitis: Secondary | ICD-10-CM

## 2016-03-14 DIAGNOSIS — R202 Paresthesia of skin: Secondary | ICD-10-CM | POA: Diagnosis not present

## 2016-03-14 MED ORDER — OMEPRAZOLE 20 MG PO CPDR: 20.0000 mg | DELAYED_RELEASE_CAPSULE | Freq: Every day | ORAL | Status: DC

## 2016-03-14 MED ORDER — METHOCARBAMOL 750 MG PO TABS: 750.0000 mg | ORAL_TABLET | Freq: Two times a day (BID) | ORAL | Status: DC | PRN

## 2016-03-14 MED ORDER — CETIRIZINE HCL 10 MG PO TABS: 10.0000 mg | ORAL_TABLET | Freq: Every day | ORAL | Status: DC

## 2016-03-14 NOTE — Patient Instructions (Signed)
Conversion Disorder  Conversion disorder is also known as functional neurological symptom disorder. People with this mental disorder have symptoms that suggest a brain or nervous system condition, such as a stroke or seizure. However, no such condition can be found to explain the symptoms. For people with conversion disorder, the symptoms may result in:  · Severe distress or anxiety.  · Disruption of relationships or other normal life activities.  · Medical evaluation. This often occurs in the primary care or emergency room setting.  Conversion disorder may begin anytime from late childhood through the young adult years. This disorder is more common in females than males.   CAUSES  The exact cause of this disorder is unknown. It is often triggered by stressful life events involving personal relationships.  SIGNS AND SYMPTOMS  Signs and symptoms of conversion disorder may include:  · Muscle weakness or paralysis. If this involves the bladder muscle, it can cause difficulty urinating.  · Seizures or attacks of being unresponsive.  · Abnormal movement, such as tremors, jerks, muscle spasms, loss of balance, or difficulty walking.  · Difficulty swallowing or a feeling of having a lump in the throat.  · Loss of speech (aphonia) or slurring of words (dysarthria).  · Loss of the sensation of touch or pain.  · Changes in vision, such as blindness or double vision.  · Seeing things that are not there (hallucinations).  · Changes in hearing, such as deafness.  Symptoms usually occur suddenly but may increase gradually over time. They usually go away completely in a short time. Seizures and tremors are more likely than other conversion symptoms to come back or continue.  DIAGNOSIS  Diagnosis of this disorder starts with an evaluation by your health care provider. Exams and tests will be done to rule out serious physical health problems. The evaluation will vary depending on your specific symptoms. It may include:  · A physical  exam, including a neurological exam.  · Lab tests on blood or urine samples.  · X-rays or other imaging studies.  · An electroencephalogram (EEG) to monitor brain waves for seizure activity.  Your health care provider may refer you to a mental health specialist for psychological evaluation.  TREATMENT  The main goal of treatment is to get the symptoms to go away as quickly as possible. Most people with this disorder respond well to relaxation techniques and reassurance from their health care provider that the symptoms are stress related. For people with symptoms that do not go away, the following treatment options are available:  · Counseling or talk therapy. Talk therapy is provided by mental health specialists. It involves discussing stressful life events that may have triggered the symptoms and ways to cope with these issues.  · Hypnotherapy. This is the use of hypnosis to help a person overcome conversion symptoms. It is provided by mental health specialists.  · Amobarbital interview. This involves discussing stressful life events that may have triggered the symptoms. The mental health specialist asks you questions after you take a short-acting medicine (amobarbital) that produces a hypnotic state.  · Medicine. Antidepressant medicine may be helpful even if a person with conversion symptoms is not depressed.  · Physical therapy. Physical therapy can help maintain muscle strength in cases of long-term paralysis.  HOME CARE INSTRUCTIONS  · Take medicines only as directed by your health care provider.  · Try to reduce stress.  · Keep all follow-up visits as directed by your health care provider. This is important.    SEEK MEDICAL CARE IF:  · Your symptoms do not go away or they become severe.  · You develop new symptoms.  SEEK IMMEDIATE MEDICAL CARE IF:  You have serious thoughts about hurting yourself or someone else.     This information is not intended to replace advice given to you by your health care provider.  Make sure you discuss any questions you have with your health care provider.     Document Released: 10/08/2010 Document Revised: 09/26/2014 Document Reviewed: 01/16/2014  Elsevier Interactive Patient Education ©2016 [employer]

## 2016-03-14 NOTE — Progress Notes (Signed)
Subjective:  Patient ID: Donald CarnesWilliam Wayde Cathleen Cortischaak Jr., male    DOB: September 27, 1990  Age: 25 y.o. MRN: [id number]  CC: Follow-up   HPI Donald CarnesWilliam Wayde Dapolito Montez HagemanJr. is a 25 year old male with a history of depression, previous history of Guillain-Barr syndrome (previously [facility] with [facility]; also managed at [facility] where he underwent several workup including EMG, lumbar puncture, MRI, serum electrophoresis and viral causes of myelopathy explored, HTLV screen came back negative) and symptoms were thought to be due to a conversion disorder.   He is being followed by Neurology at [facility] and he remains on gabapentin and vitamin B12 injections for Paresthesia. Records from care and really reviewed and at his last visit in 12/2015 he was thought to have conversion disorder. Repeat EMG ordered and recommendation was to reestablish with psychiatry. He has an upcoming appointment next month.  Today he informs me that he has weakness of his lower extremities more on the left and now has numbness in both upper extremities and lower extremities. He ambulates with the aid of a walker or wheelchair and recently took a fall because his walker would not fit in his bathroom. He is wondering if he could obtain PCS services and would like to have a wheelchair and also a motorized wheelchair. He was previously on JordanLatuda which he received from [facility] but informs me he could not go there and wait for 8 hours to be seen. Complains of some epigastric tenderness and allergy symptoms.   Past Medical History  Diagnosis Date  . Depression 2012    History of self injury.  Reyes Ivan. Guillain Barr syndrome [facility](HCC)     Suspect not actually GBS, but rather conversion disorder(see hospitalization at [facility] 05/2015)    Past Surgical History  Procedure Laterality Date  . Nasal cauterization      Allergies  Allergen Reactions  . Ibuprofen Other (See Comments)    Nose bleeds  . Fluzone  [Flu Virus  Vaccine] Other (See Comments)    Patient with documented Guillain-Barre syndrome diagnosis    Outpatient Prescriptions Prior to Visit  Medication Sig Dispense Refill  . cyanocobalamin (,VITAMIN B-12,) 1000 MCG/ML injection Inject 1 mL (1,000 mcg total) into the muscle every 30 (thirty) days. 1 mL 3  . gabapentin (NEURONTIN) 100 MG capsule Take 100 mg by mouth 3 (three) times daily.    Marland Kitchen. glucose blood (TRUE METRIX BLOOD GLUCOSE TEST) test strip Use as instructed, Check sugar once daily, in the morning before eating or drinking. 100 each 3  . levETIRAcetam (KEPPRA) 500 MG tablet Take 1 tablet (500 mg total) by mouth 2 (two) times daily. 60 tablet 1  . TRUEPLUS LANCETS 28G MISC 1 each by Does not apply route every morning. Check sugar once daily, in the morning before eating or drinking. 100 each 3  . methocarbamol (ROBAXIN-750) 750 MG tablet Take 1 tablet (750 mg total) by mouth 2 (two) times daily as needed for muscle spasms. 60 tablet 2  . cyanocobalamin (,VITAMIN B-12,) 1000 MCG/ML injection Inject 1 mL (1,000 mcg total) into the muscle once. (Patient not taking: Reported on 02/24/2016) 1 mL 0  . ergocalciferol (VITAMIN D2) 50000 units capsule Take 1 capsule (50,000 Units total) by mouth once a week. (Patient not taking: Reported on 03/14/2016) 60 capsule 0  . Lurasidone HCl (LATUDA) 60 MG TABS Take 120 mg by mouth daily. Reported on 03/14/2016    . QUEtiapine (SEROQUEL) 50 MG tablet Take 50 mg by  mouth at bedtime. Reported on 03/14/2016    . traZODone (DESYREL) 100 MG tablet Take 100 mg by mouth at bedtime. Reported on 03/14/2016     No facility-administered medications prior to visit.    ROS Review of Systems Constitutional: Negative for activity change and appetite change.  positive for fatigue. HENT: Negative for sinus pressure and sore throat, Positive for postnasal drip and nasal congestion.  Eyes: Negative for visual disturbance.  Respiratory: Negative for cough, chest tightness and  shortness of breath.   Cardiovascular: Negative for chest pain and leg swelling.  Gastrointestinal: Positive for abdominal pain, negative for diarrhea, constipation and abdominal distention.  Endocrine: Negative.   Genitourinary: Negative for dysuria.  Musculoskeletal: Negative for myalgias and joint swelling. Positive for back pain Skin: Negative for rash.  Allergic/Immunologic: Negative.   Neurological: Positive for seizures and weakness worse in the left leg. Negative for light-headedness and positive for numbness.  Psychiatric/Behavioral: Negative for suicidal ideas and dysphoric mood.      Objective:  BP 139/80 mmHg  Pulse 96  Temp(Src) 98.6 F (37 C) (Oral)  Resp 14  Ht 6\' 1"  (1.854 m)  Wt 326 lb 9.6 oz (148.145 kg)  BMI 43.10 kg/m2  SpO2 96%  BP/Weight 03/14/2016 02/24/2016 12/01/2015  Systolic BP 139 139 135  Diastolic BP 80 82 81  Wt. (Lbs) 326.6 - 320  BMI 43.1 - 42.23      Physical Exam Constitutional: He is oriented to person, place, and time. He appears well-developed and well-nourished.  Obese  Cardiovascular: Normal rate, normal heart sounds and intact distal pulses.   No murmur heard. Pulmonary/Chest: Effort normal and breath sounds normal. He has no wheezes. He has no rales. He exhibits no tenderness.  Abdominal: Soft. Bowel sounds are normal. He exhibits no distension and no mass. There is no tenderness.  Musculoskeletal: Normal range of motion.  Neurological: He is alert and oriented to person, place, and time.  LLE strength -3+/5, RLE strength- 5/5 LUE strength -3+/5, RUE strength-5/5 Dysesthesia in bilateral lower extremity Psychiatric: He has a normal mood and affect.   Assessment & Plan:   1. Obesity Advised on weight loss  2. Paresthesia Unknown etiology Neurological versus conversion disorder Continue B12 injections every month as per neurology  3. Right-sided low back pain without sciatica Could be positional as he also sits in a  wheelchair most of the time. - methocarbamol (ROBAXIN-750) 750 MG tablet; Take 1 tablet (750 mg total) by mouth 2 (two) times daily as needed for muscle spasms.  Dispense: 60 tablet; Refill: 2  4. Seizures (HCC) No recent seizures Continue Keppra I have informed him his refill of Keppra is deferred to his neurologist  5. Depression Referred to psychiatry for optimization of management - Ambulatory referral to Psychiatry  6. Myopathy Uses wheelchair as he is high risk for falls I have explained to him he does not qualify for a motorized wheelchair and I do not think he needs PCS services at home.  7. Gastritis and gastroduodenitis Start PPI - omeprazole (PRILOSEC) 20 MG capsule; Take 1 capsule (20 mg total) by mouth daily.  Dispense: 30 capsule; Refill: 3  8. Seasonal allergies Placed on antihistamines - cetirizine (ZYRTEC) 10 MG tablet; Take 1 tablet (10 mg total) by mouth daily.  Dispense: 30 tablet; Refill: 3   Meds ordered this encounter  Medications  . methocarbamol (ROBAXIN-750) 750 MG tablet    Sig: Take 1 tablet (750 mg total) by mouth 2 (two) times daily  as needed for muscle spasms.    Dispense:  60 tablet    Refill:  2  . omeprazole (PRILOSEC) 20 MG capsule    Sig: Take 1 capsule (20 mg total) by mouth daily.    Dispense:  30 capsule    Refill:  3  . cetirizine (ZYRTEC) 10 MG tablet    Sig: Take 1 tablet (10 mg total) by mouth daily.    Dispense:  30 tablet    Refill:  3    Follow-up: Return in about 1 month (around 04/13/2016) for nurse visit- b12 shots; 3 months with me.Jaclyn Shaggy MD

## 2016-03-14 NOTE — Progress Notes (Signed)
Pt here for F/U. Pt reports pain in back, chest, and head rated at a 7. Pt needs refills on lancets and test strips. Pt has not taken medications today.

## 2016-03-25 ENCOUNTER — Telehealth: Payer: Self-pay

## 2016-03-25 NOTE — Telephone Encounter (Signed)
Writer called patient to inform him that MD has filled out the [employer] [place] [employer] paerwork and MD has a sticky note on a particular page patient needs to fill out.  Paperwork left in the brown accordion folder at the front desk- patient to pick up on Monday.

## 2016-04-05 ENCOUNTER — Telehealth: Payer: Self-pay | Admitting: Family Medicine

## 2016-04-05 NOTE — Telephone Encounter (Signed)
Pt called states [facility] has been trying to f/up on wheelchair rx for pt,please f/up

## 2016-04-06 NOTE — Telephone Encounter (Addendum)
Spoke to [name] at [facility], wheelchair Rx still in process.  [facility] needs a valid Rx; MD's first and last name, four supporting safety accessories, OV notes supporting need for wheelchair. Advised Rx and forms faxed back to our office, filled out and given to [facility] to [facility] per Dr. Venetia NightAmao. [name] [name] in case review dept states forms or Rx never received. Will fax forms again. No other info needed other than forms.

## 2016-04-06 NOTE — Telephone Encounter (Signed)
I completed the order and it should have been faxed.

## 2016-04-11 ENCOUNTER — Telehealth: Payer: Self-pay | Admitting: Family Medicine

## 2016-04-11 NOTE — Telephone Encounter (Signed)
Pt called asking about his Rx for a wheel chair   Pt states he has been trying to get a wheel chair for awhile and the company needs additional paperwork from PCP   Please assist as soon as possible

## 2016-04-11 NOTE — Telephone Encounter (Signed)
Pt following up on referrals to ENT doctor and gastrologist   Pt states he requested a referral to these two places at his last visit and has not heard anything since then  Please follow up thank you

## 2016-04-13 NOTE — Telephone Encounter (Signed)
I spoke to patient and I explain to him that on 10/21/15 I sent the referral to [facility] and they didn't have slots available and patient oc expired 01/06/16 but now he has medicaid and I will sent his referral to [facility]  Thank you

## 2016-04-18 ENCOUNTER — Ambulatory Visit: Payer: Self-pay

## 2016-04-19 NOTE — Telephone Encounter (Signed)
Clld pt   - he advsd that  [facility] need more information from [facility].   I contacted [facility] [phone] spoke to Jan/CSR who advsd me that they are still waiting on office notes explaining why patient is in need of a wheelchair. Verified [facility] f# [phone]. Advsd I would fax 03/14/2016 office notes regarding the need for the use of a wheelchair due to him being Dx myopathy and the use would decrease his high risk of falls.

## 2016-05-02 ENCOUNTER — Emergency Department (HOSPITAL_COMMUNITY)
Admission: EM | Admit: 2016-05-02 | Discharge: 2016-05-03 | Disposition: A | Discharge status: Home / Self Care | Payer: Medicaid Other | Source: Home / Self Care

## 2016-05-02 ENCOUNTER — Emergency Department (HOSPITAL_COMMUNITY)
Admission: EM | Admit: 2016-05-02 | Discharge: 2016-05-02 | Discharge status: Against Medical Advice | Payer: Medicaid Other

## 2016-05-02 ENCOUNTER — Other Ambulatory Visit: Payer: Self-pay | Admitting: Otolaryngology

## 2016-05-02 ENCOUNTER — Emergency Department (HOSPITAL_COMMUNITY)
Admission: EM | Admit: 2016-05-02 | Discharge: 2016-05-02 | Disposition: A | Discharge status: Home / Self Care | Payer: Medicaid Other

## 2016-05-02 DIAGNOSIS — K91840 Postprocedural hemorrhage and hematoma of a digestive system organ or structure following a digestive system procedure: Secondary | ICD-10-CM | POA: Diagnosis not present

## 2016-05-02 DIAGNOSIS — J029 Acute pharyngitis, unspecified: Secondary | ICD-10-CM

## 2016-05-02 DIAGNOSIS — R042 Hemoptysis: Secondary | ICD-10-CM | POA: Diagnosis present

## 2016-05-02 DIAGNOSIS — Z5321 Procedure and treatment not carried out due to patient leaving prior to being seen by health care provider: Secondary | ICD-10-CM

## 2016-05-02 DIAGNOSIS — F17210 Nicotine dependence, cigarettes, uncomplicated: Secondary | ICD-10-CM | POA: Diagnosis not present

## 2016-05-02 MED ORDER — ONDANSETRON 4 MG PO TBDP
4.0000 mg | ORAL_TABLET | Freq: Once | ORAL | Status: AC | PRN
  Administered 2016-05-02: 4 mg via ORAL
  Filled 2016-05-02: qty 1

## 2016-05-02 NOTE — ED Triage Notes (Signed)
Pt called for reasses vitals no response.

## 2016-05-02 NOTE — ED Notes (Signed)
Pt stating he is wanting to leave due to wait time. RN notified.

## 2016-05-02 NOTE — ED Notes (Signed)
Pt was here earlier and called the charge RN to complain about having to wait, it was explained to him and then he left before being triaged, pt arrived back to the ED via EMS within an hour

## 2016-05-02 NOTE — ED Triage Notes (Signed)
Pt gave front desk clerk their armband and left.

## 2016-05-02 NOTE — ED Triage Notes (Addendum)
Pt BIB [facility] after having his tonsils out today before noon. States that he has been 'spitting up blood' and vomiting. Pt has not vomitted in the care of EMS and has been spitting up but no blood noted. Alert and oriented. Airway intact. Speaking in complete sentences.

## 2016-05-03 ENCOUNTER — Encounter (HOSPITAL_COMMUNITY): Payer: Self-pay | Admitting: *Deleted

## 2016-05-03 ENCOUNTER — Telehealth: Payer: Self-pay | Admitting: Family Medicine

## 2016-05-03 ENCOUNTER — Emergency Department (HOSPITAL_COMMUNITY)
Admission: EM | Admit: 2016-05-03 | Discharge: 2016-05-03 | Disposition: A | Discharge status: Home / Self Care | Payer: Medicaid Other | Attending: Emergency Medicine | Admitting: Emergency Medicine

## 2016-05-03 DIAGNOSIS — G8918 Other acute postprocedural pain: Secondary | ICD-10-CM

## 2016-05-03 DIAGNOSIS — T888XXA Other specified complications of surgical and medical care, not elsewhere classified, initial encounter: Secondary | ICD-10-CM

## 2016-05-03 DIAGNOSIS — K91840 Postprocedural hemorrhage and hematoma of a digestive system organ or structure following a digestive system procedure: Secondary | ICD-10-CM | POA: Insufficient documentation

## 2016-05-03 DIAGNOSIS — Z9089 Acquired absence of other organs: Secondary | ICD-10-CM

## 2016-05-03 DIAGNOSIS — F17210 Nicotine dependence, cigarettes, uncomplicated: Secondary | ICD-10-CM | POA: Insufficient documentation

## 2016-05-03 HISTORY — DX: Unspecified convulsions: R56.9

## 2016-05-03 HISTORY — DX: Anxiety disorder, unspecified: F41.9

## 2016-05-03 MED ORDER — LIDOCAINE VISCOUS 2 % MT SOLN
15.0000 mL | Freq: Once | OROMUCOSAL | Status: AC
  Administered 2016-05-03: 15 mL via OROMUCOSAL
  Filled 2016-05-03: qty 15

## 2016-05-03 MED ORDER — MORPHINE SULFATE (PF) 4 MG/ML IV SOLN
4.0000 mg | Freq: Once | INTRAVENOUS | Status: AC
  Administered 2016-05-03: 4 mg via INTRAVENOUS
  Filled 2016-05-03: qty 1

## 2016-05-03 MED ORDER — OXYCODONE HCL 5 MG/5ML PO SOLN
5.0000 mg | Freq: Once | ORAL | Status: AC
  Administered 2016-05-03: 5 mg via ORAL
  Filled 2016-05-03: qty 5

## 2016-05-03 MED ORDER — SODIUM CHLORIDE 0.9 % IV SOLN
500.0000 mg | Freq: Once | INTRAVENOUS | Status: AC
  Administered 2016-05-03: 500 mg via INTRAVENOUS
  Filled 2016-05-03: qty 5

## 2016-05-03 MED ORDER — SODIUM CHLORIDE 0.9 % IV BOLUS (SEPSIS)
2000.0000 mL | Freq: Once | INTRAVENOUS | Status: AC
  Administered 2016-05-03: 2000 mL via INTRAVENOUS

## 2016-05-03 MED ORDER — OXYCODONE HCL 5 MG PO TABS
5.0000 mg | ORAL_TABLET | Freq: Once | ORAL | Status: AC
Start: 2016-05-03 — End: 2016-05-03
  Administered 2016-05-03: 5 mg via ORAL
  Filled 2016-05-03: qty 1

## 2016-05-03 MED ORDER — OXYCODONE HCL 5 MG/5ML PO SOLN
5.0000 mg | Freq: Once | ORAL | Status: AC
  Administered 2016-05-03: 5 mg via ORAL

## 2016-05-03 MED ORDER — ACETAMINOPHEN 160 MG/5ML PO SOLN
500.0000 mg | Freq: Once | ORAL | Status: AC
  Administered 2016-05-03: 500 mg via ORAL
  Filled 2016-05-03: qty 20.3

## 2016-05-03 NOTE — ED Notes (Signed)
Pt. C/o nausea and pain unrelieved by morphine

## 2016-05-03 NOTE — ED Triage Notes (Signed)
Patient called to treatment room, second call, no answer.  

## 2016-05-03 NOTE — Telephone Encounter (Signed)
Pt. Called stating that he had surgery and would like to know what id the time frame until he can get his vitamin B12 injection. Please f/u with pt.

## 2016-05-03 NOTE — ED Triage Notes (Signed)
Came in today complaining of hemoptysis from a tonsillectomy performed yesterday. Pt. sts it is pure blood and his throat is very sore and he unable to keep food or drink or pain medication down. Pt. Has a history of Nelia ShiGillian Barre syndrome

## 2016-05-03 NOTE — ED Provider Notes (Signed)
[facility] DEPT Provider Note   CSN: [id number] Arrival date & time: 05/03/16  1439     History   Chief Complaint Chief Complaint  Patient presents with  . Hemoptysis  . Sore Throat    HPI Donald CarnesWilliam Wayde Crom Montez HagemanJr. is a 25 y.o. male.  25 year old male with past medical history including seizures, depression, conversion disorder, recent tonsillectomy who presents with hemoptysis and throat pain. The patient had a routine tonsillectomy yesterday and approximately one hour after he was discharged, he began coughing and spitting up blood. He presented here to the ER last night but later left without being seen due to wait time. He reports that the spitting up blood and has worsened since yesterday; he states he is both spitting up and vomiting blood. His pain is currently severe and constant in his throat because he has not been able to swallow any pain medication. He also reports that he has not taken Keppra since the night prior to surgery. He reports MAXIMUM TEMPERATURE 99.8 at home. He spoke with his surgeon who sent him here for IV fluids due to concern for dehydration as he has not urinated since yesterday.   The history is provided by the patient.  Sore Throat     Past Medical History:  Diagnosis Date  . Anxiety   . Depression 2012   History of self injury.  Reyes Ivan. Guillain Barr syndrome [facility](HCC)    Suspect not actually GBS, but rather conversion disorder([facility])  . Seizures [facility]HCC)     Patient Active Problem List   Diagnosis Date Noted  . Gastritis and gastroduodenitis 03/14/2016  . Vitamin D deficiency 12/02/2015  . Chest pain 11/21/2015  . Seizures (HCC) 10/09/2015  . Recurrent tonsillitis 10/09/2015  . Obesity 10/09/2015  . Back pain 09/02/2015  . 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 Headache 07/09/2015  . Paresthesia 07/07/2015  . 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 Encounter for lumbar puncture   . Depression 05/15/2015  . Tobacco use disorder  05/12/2015    Past Surgical History:  Procedure Laterality Date  . Nasal Cauterization    . TONSILLECTOMY         Home Medications    Prior to Admission medications   Medication Sig Start Date End Date Taking? Authorizing Provider  cetirizine (ZYRTEC) 10 MG tablet Take 1 tablet (10 mg total) by mouth daily. 03/14/16   Jaclyn ShaggyEnobong [name], MD  cyanocobalamin (,VITAMIN B-12,) 1000 MCG/ML injection Inject 1 mL (1,000 mcg total) into the muscle every 30 (thirty) days. 02/08/16   Jaclyn ShaggyEnobong [name], MD  cyanocobalamin (,VITAMIN B-12,) 1000 MCG/ML injection Inject 1 mL (1,000 mcg total) into the muscle once. Patient not taking: Reported on 02/24/2016 02/22/16   Jaclyn ShaggyEnobong [name], MD  ergocalciferol (VITAMIN D2) 50000 units capsule Take 1 capsule (50,000 Units total) by mouth once a week. Patient not taking: Reported on 03/14/2016 12/02/15   Jaclyn ShaggyEnobong [name], MD  gabapentin (NEURONTIN) 100 MG capsule Take 100 mg by mouth 3 (three) times daily.    Historical Provider, MD  glucose blood (TRUE METRIX BLOOD GLUCOSE TEST) test strip Use as instructed, Check sugar once daily, in the morning before eating or drinking. 02/22/16   Jaclyn ShaggyEnobong [name], MD  levETIRAcetam (KEPPRA) 500 MG tablet Take 1 tablet (500 mg total) by mouth 2 (two) times daily. 12/01/15   Jaclyn ShaggyEnobong [name], MD  Lurasidone HCl (LATUDA) 60 MG TABS Take 120 mg by mouth daily. Reported on 03/14/2016    Historical Provider, MD  methocarbamol (ROBAXIN-750) 750 MG  tablet Take 1 tablet (750 mg total) by mouth 2 (two) times daily as needed for muscle spasms. 03/14/16   Jaclyn ShaggyEnobong [name], MD  omeprazole (PRILOSEC) 20 MG capsule Take 1 capsule (20 mg total) by mouth daily. 03/14/16   Jaclyn ShaggyEnobong [name], MD  QUEtiapine (SEROQUEL) 50 MG tablet Take 50 mg by mouth at bedtime. Reported on 03/14/2016    Historical Provider, MD  traZODone (DESYREL) 100 MG tablet Take 100 mg by mouth at bedtime. Reported on 03/14/2016    Historical Provider, MD  TRUEPLUS LANCETS 28G MISC 1 each by Does not apply route every  morning. Check sugar once daily, in the morning before eating or drinking. 06/26/15   Quentin Angstlugbemiga E Jegede, MD    Family History Family History  Problem Relation Age of Onset  . Cancer Mother   . Stroke Maternal Grandmother     Social History Social History  Substance Use Topics  . Smoking status: Current Every Day Smoker    Packs/day: 0.25    Types: Cigarettes  . Smokeless tobacco: Never Used  . Alcohol use Yes     Comment: occasional      Allergies   Ibuprofen and Fluzone  [flu virus vaccine]   Review of Systems Review of Systems 10 Systems reviewed and are negative for acute change except as noted in the HPI.   Physical Exam Updated Vital Signs BP 116/78   Pulse 102   Temp 97.8 F (36.6 C) (Oral)   Resp 24   Ht 6\' 1"  (1.854 m)   Wt (!) 315 lb (142.9 kg)   SpO2 100%   BMI 41.56 kg/m   Physical Exam  Constitutional: He is oriented to person, place, and time. He appears well-developed and well-nourished. No distress.  uncomfortable  HENT:  Head: Normocephalic and atraumatic.  Mildly dry mucous membranes; eschar over b/l tonsillar fossae, no active bleeding and no coagulated blood visible  Eyes: Conjunctivae are normal. Pupils are equal, round, and reactive to light.  Neck: Neck supple.  Cardiovascular: Normal rate, regular rhythm and normal heart sounds.   No murmur heard. Pulmonary/Chest: Effort normal and breath sounds normal.  Abdominal: Soft. Bowel sounds are normal. He exhibits no distension. There is no tenderness.  Musculoskeletal: He exhibits no edema.  Neurological: He is alert and oriented to person, place, and time.  Fluent speech  Skin: Skin is warm and dry. No rash noted.  Psychiatric: He has a normal mood and affect.  Nursing note and vitals reviewed.    ED Treatments / Results  Labs (all labs ordered are listed, but only abnormal results are displayed) Labs Reviewed - No data to display  EKG  EKG Interpretation None        Radiology No results found.  Procedures Procedures (including critical care time)  Medications Ordered in ED Medications  oxyCODONE (ROXICODONE) 5 MG/5ML solution 5 mg (not administered)  morphine 4 MG/ML injection 4 mg (4 mg Intravenous Given 05/03/16 1714)  sodium chloride 0.9 % bolus 2,000 mL (0 mLs Intravenous Stopped 05/03/16 2037)  levETIRAcetam (KEPPRA) 500 mg in sodium chloride 0.9 % 100 mL IVPB (0 mg Intravenous Stopped 05/03/16 1815)  lidocaine (XYLOCAINE) 2 % viscous mouth solution 15 mL (15 mLs Mouth/Throat Given 05/03/16 1906)  oxyCODONE (Oxy IR/ROXICODONE) immediate release tablet 5 mg (5 mg Oral Given 05/03/16 1906)    And  acetaminophen (TYLENOL) solution 500 mg (500 mg Oral Given 05/03/16 1906)  oxyCODONE (ROXICODONE) 5 MG/5ML solution 5 mg (5 mg Oral Given 05/03/16  2024)  lidocaine (XYLOCAINE) 2 % viscous mouth solution 15 mL (15 mLs Mouth/Throat Given 05/03/16 2024)     Initial Impression / Assessment and Plan / ED Course  I have reviewed the triage vital signs and the nursing notes.    Clinical Course   Patient presents with spitting up blood and inability to the swallow due to pain after tonsillectomy yesterday. He was awake and alert, nontoxic and in no acute distress. Initial heart rate 101, BP 135/87. He was tolerating his secretions with no respiratory distress and no visible bleeding on exam. Gave the patient an IV fluid bolus, morphine, and his home dose of Keppra. Discussed with on call ENT surgeon, Dr. Pollyann Kennedy, who felt that if pt has not had any bleeding after multiple hours of observation, he felt comfortable w/ d/c after pain controlled and tolerating PO.   After several hours of observation, pt able to tolerate PO oxycodone liquid and well appearing on re-evaluation.He later stated that he had not had any bleeding since this morning. Discussed importance of staying on schedule for pain management and Following soft diet. Discussed which foods to avoid.  Instructed to contact Dr. Jenne Pane in the morning to discuss follow-up visit. Reviewed return precautions including fever or repeat bleeding. Patient voiced understanding and was discharged in satisfactory condition.  Final Clinical Impressions(s) / ED Diagnoses   Final diagnoses:  Post-tonsillectomy pain  Post-tonsillectomy hemorrhage, initial encounter    New Prescriptions New Prescriptions   No medications on file     Laurence Spates, MD 05/03/16 2138

## 2016-05-03 NOTE — ED Notes (Signed)
Pt. Unable to swallow oxy pill

## 2016-05-04 NOTE — Telephone Encounter (Signed)
His B12 injections are once/month and the next dose will be 1 month from his last dose except if he cannot make it to the clinic and he can reschedule it for when he is able to.

## 2016-05-06 NOTE — Telephone Encounter (Signed)
Writer called patient and LVM per Dr. Venetia NightAmao.  Writer informed him that he can have his B12 injection on the 29th of August when he comes in to see Dr. Venetia NightAmao for an appt.

## 2016-05-07 ENCOUNTER — Emergency Department (HOSPITAL_COMMUNITY)
Admission: EM | Admit: 2016-05-07 | Discharge: 2016-05-08 | Disposition: A | Discharge status: Home / Self Care | Payer: Medicaid Other | Attending: Emergency Medicine | Admitting: Emergency Medicine

## 2016-05-07 ENCOUNTER — Encounter (HOSPITAL_COMMUNITY): Payer: Self-pay | Admitting: Emergency Medicine

## 2016-05-07 DIAGNOSIS — F17210 Nicotine dependence, cigarettes, uncomplicated: Secondary | ICD-10-CM | POA: Insufficient documentation

## 2016-05-07 DIAGNOSIS — G8918 Other acute postprocedural pain: Secondary | ICD-10-CM | POA: Insufficient documentation

## 2016-05-07 DIAGNOSIS — R1310 Dysphagia, unspecified: Secondary | ICD-10-CM

## 2016-05-07 LAB — CBC
HEMATOCRIT: 44.8 % (ref 39.0–52.0)
Hemoglobin: 15.1 g/dL (ref 13.0–17.0)
MCH: 30.5 pg (ref 26.0–34.0)
MCHC: 33.7 g/dL (ref 30.0–36.0)
MCV: 90.5 fL (ref 78.0–100.0)
PLATELETS: 287 10*3/uL (ref 150–400)
RBC: 4.95 MIL/uL (ref 4.22–5.81)
RDW: 12.6 % (ref 11.5–15.5)
WBC: 6.5 10*3/uL (ref 4.0–10.5)

## 2016-05-07 LAB — BASIC METABOLIC PANEL
Anion gap: 8 (ref 5–15)
BUN: 9 mg/dL (ref 6–20)
CALCIUM: 9.1 mg/dL (ref 8.9–10.3)
CO2: 24 mmol/L (ref 22–32)
Chloride: 107 mmol/L (ref 101–111)
Creatinine, Ser: 0.9 mg/dL (ref 0.61–1.24)
GFR calc Af Amer: >60 mL/min (ref >60)
Glucose, Bld: 97 mg/dL (ref 65–99)
POTASSIUM: 3.7 mmol/L (ref 3.5–5.1)
SODIUM: 139 mmol/L (ref 135–145)

## 2016-05-07 MED ORDER — HYDROCODONE-ACETAMINOPHEN 7.5-325 MG/15ML PO SOLN
10.0000 mL | Freq: Once | ORAL | Status: AC
  Administered 2016-05-08: 10 mL via ORAL
  Filled 2016-05-07: qty 15

## 2016-05-07 MED ORDER — DEXAMETHASONE SODIUM PHOSPHATE 10 MG/ML IJ SOLN
10.0000 mg | Freq: Once | INTRAMUSCULAR | Status: AC
  Administered 2016-05-07: 10 mg via INTRAVENOUS
  Filled 2016-05-07: qty 1

## 2016-05-07 MED ORDER — SODIUM CHLORIDE 0.9 % IV BOLUS (SEPSIS)
1000.0000 mL | Freq: Once | INTRAVENOUS | Status: AC
  Administered 2016-05-07: 1000 mL via INTRAVENOUS

## 2016-05-07 MED ORDER — SODIUM CHLORIDE 0.9 % IV SOLN
500.0000 mg | Freq: Once | INTRAVENOUS | Status: AC
  Administered 2016-05-07: 500 mg via INTRAVENOUS
  Filled 2016-05-07: qty 5

## 2016-05-07 MED ORDER — KETOROLAC TROMETHAMINE 30 MG/ML IJ SOLN
30.0000 mg | Freq: Once | INTRAMUSCULAR | Status: AC
  Administered 2016-05-07: 30 mg via INTRAVENOUS
  Filled 2016-05-07: qty 1

## 2016-05-07 NOTE — ED Triage Notes (Addendum)
Pt c/o had tonsils removed on Monday. Pt has not been able to eat or drink since. Pt also reports redness to B/L eyes. Pt with hoarse voice

## 2016-05-07 NOTE — ED Provider Notes (Signed)
[facility] Provider Note   CSN: [id number] Arrival date & time: 05/07/16  1841     History   Chief Complaint Chief Complaint  Patient presents with  . Dehydration  . Post-op Problem    HPI Tawni Carnes Biglow [name] is a 25 y.o. male.  Patient with hx of anxiety, depression, GBS vs conversion disorder, and seizures presents to the ED for persistent dysphagia and sore throat. Patient is 5 days s/p tonsillectomy by Dr. Jenne Pane. He was evaluated on Tuesday for concern about post-tonsillectomy bleed. Patient presents today for persistent dysphagia. He states that he has been unable to tolerate soft foods, other than pudding, as he feels he cannot swallow it. He has not been able to take his liquid hydrocodone secondary to worsening pain as well. Patient concerned for dehydration also given lack of PO intake. Last void was yesterday. Patient also noting some blood in his sputum today. He states that he is scheduled to have a repeat surgical procedure done with Dr. Jenne Pane on Monday because he "hasn't been healing well". Patient denies contacting Dr. Jenne Pane since his last ED visit and cannot give a good reason for his lack of follow up. He denies taking his Keppra secondary to dysphagia. No known seizure activity recently. Patient denies fevers and drooling. No c/o SOB.     Past Medical History:  Diagnosis Date  . Anxiety   . Depression 2012   History of self injury.  Reyes Ivan syndrome [facility])    Suspect not actually GBS, but rather conversion disorder(see hospitalization at [facility] 05/2015)  . Seizures [facility])     Patient Active Problem List   Diagnosis Date Noted  . Gastritis and gastroduodenitis 03/14/2016  . Vitamin D deficiency 12/02/2015  . Chest pain 11/21/2015  . Seizures (HCC) 10/09/2015  . Recurrent tonsillitis 10/09/2015  . Obesity 10/09/2015  . Back pain 09/02/2015  . 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 Headache 07/09/2015  . Paresthesia 07/07/2015  . Conversion disorder  07/04/2015  . Encounter for lumbar puncture   . Depression 05/15/2015  . Tobacco use disorder 05/12/2015    Past Surgical History:  Procedure Laterality Date  . Nasal Cauterization    . TONSILLECTOMY       Home Medications    Prior to Admission medications   Medication Sig Start Date End Date Taking? Authorizing Provider  cetirizine (ZYRTEC) 10 MG tablet Take 1 tablet (10 mg total) by mouth daily. 03/14/16   Jaclyn Shaggy, MD  cyanocobalamin (,VITAMIN B-12,) 1000 MCG/ML injection Inject 1 mL (1,000 mcg total) into the muscle every 30 (thirty) days. 02/08/16   Jaclyn Shaggy, MD  cyanocobalamin (,VITAMIN B-12,) 1000 MCG/ML injection Inject 1 mL (1,000 mcg total) into the muscle once. Patient not taking: Reported on 02/24/2016 02/22/16   Jaclyn Shaggy, MD  ergocalciferol (VITAMIN D2) 50000 units capsule Take 1 capsule (50,000 Units total) by mouth once a week. Patient not taking: Reported on 03/14/2016 12/02/15   Jaclyn Shaggy, MD  gabapentin (NEURONTIN) 100 MG capsule Take 100 mg by mouth 3 (three) times daily.    Historical Provider, MD  glucose blood (TRUE METRIX BLOOD GLUCOSE TEST) test strip Use as instructed, Check sugar once daily, in the morning before eating or drinking. 02/22/16   Jaclyn Shaggy, MD  levETIRAcetam (KEPPRA) 500 MG tablet Take 1 tablet (500 mg total) by mouth 2 (two) times daily. 12/01/15   Jaclyn Shaggy, MD  Lurasidone HCl (LATUDA) 60 MG TABS Take 120 mg by mouth daily. Reported on 03/14/2016  Historical Provider, MD  methocarbamol (ROBAXIN-750) 750 MG tablet Take 1 tablet (750 mg total) by mouth 2 (two) times daily as needed for muscle spasms. 03/14/16   Jaclyn ShaggyEnobong [name], MD  omeprazole (PRILOSEC) 20 MG capsule Take 1 capsule (20 mg total) by mouth daily. 03/14/16   Jaclyn ShaggyEnobong [name], MD  QUEtiapine (SEROQUEL) 50 MG tablet Take 50 mg by mouth at bedtime. Reported on 03/14/2016    Historical Provider, MD  traZODone (DESYREL) 100 MG tablet Take 100 mg by mouth at bedtime. Reported on 03/14/2016     Historical Provider, MD  TRUEPLUS LANCETS 28G MISC 1 each by Does not apply route every morning. Check sugar once daily, in the morning before eating or drinking. 06/26/15   Quentin Angstlugbemiga E Jegede, MD    Family History Family History  Problem Relation Age of Onset  . Cancer Mother   . Stroke Maternal Grandmother     Social History Social History  Substance Use Topics  . Smoking status: Current Every Day Smoker    Packs/day: 0.25    Types: Cigarettes  . Smokeless tobacco: Never Used  . Alcohol use Yes     Comment: occasional      Allergies   Ibuprofen and Fluzone  [flu virus vaccine]   Review of Systems Review of Systems  Constitutional: Negative for fever.  HENT: Positive for sore throat and trouble swallowing. Negative for drooling.   Respiratory: Negative for shortness of breath.   Gastrointestinal: Positive for vomiting.  Ten systems reviewed and are negative for acute change, except as noted in the HPI.    Physical Exam Updated Vital Signs BP 150/92   Pulse 91   Temp 98.2 F (36.8 C) (Oral)   Resp 20   SpO2 97%   Physical Exam  Constitutional: He is oriented to person, place, and time. He appears well-developed and well-nourished. No distress.  Nontoxic/nonseptic appearing  HENT:  Head: Normocephalic and atraumatic.  Uvula midline. Posterior oropharyngeal exudates. Patient tolerating secretions. Voice hoarseness. No tripoding. No stridor. No evidence of bleeding post tonsillectomy.  Eyes: EOM are normal. No scleral icterus.  Subconjunctival hemorrhages noted bilaterally. EOMs normal. No proptosis or c/o eye pain.  Neck: Normal range of motion.  No nuchal rigidity or meningismus  Cardiovascular: Normal rate, regular rhythm and intact distal pulses.   Pulmonary/Chest: Effort normal. No respiratory distress.  Respirations even and unlabored  Musculoskeletal: Normal range of motion.  Lymphadenopathy:    He has cervical adenopathy.  Neurological: He is alert  and oriented to person, place, and time.  GCS 15. Patient moving all extremities.  Skin: Skin is warm and dry. No rash noted. He is not diaphoretic. No erythema. No pallor.  Psychiatric: He has a normal mood and affect. His behavior is normal.  Nursing note and vitals reviewed.    ED Treatments / Results  Labs (all labs ordered are listed, but only abnormal results are displayed) Labs Reviewed - No data to display  EKG  EKG Interpretation None       Radiology No results found.  Procedures Procedures (including critical care time)  Medications Ordered in ED Medications  sodium chloride 0.9 % bolus 1,000 mL (0 mLs Intravenous Stopped 05/07/16 2219)  sodium chloride 0.9 % bolus 1,000 mL (0 mLs Intravenous Stopped 05/08/16 0013)  ketorolac (TORADOL) 30 MG/ML injection 30 mg (30 mg Intravenous Given 05/07/16 2216)  dexamethasone (DECADRON) injection 10 mg (10 mg Intravenous Given 05/07/16 2216)  levETIRAcetam (KEPPRA) 500 mg in sodium chloride 0.9 %  100 mL IVPB (0 mg Intravenous Stopped 05/07/16 2234)  HYDROcodone-acetaminophen (HYCET) 7.5-325 mg/15 ml solution 10 mL (10 mLs Oral Given 05/08/16 0009)        Initial Impression / Assessment and Plan / ED Course  I have reviewed the triage vital signs and the nursing notes.  Pertinent labs & imaging results that were available during my care of the patient were reviewed by me and considered in my medical decision making (see chart for details).  Clinical Course   9:30 PM Patient assessed. C/o pain post tonsillectomy and decreased POs secondary to pain. No evidence of post tonsillectomy bleed. There are exudates in the posterior oropharynx which are suspected secondary to recent operative procedure. No fever or uvula deviation. No tripoding. Patient tolerating secretions. Low suspicion for infectious complication. IVF ordered as well as Decadron and Toradol for pain control. Will continue to monitor.  10:15 PM Plan to obtain labs  to evaluate for potential dehydration. Hycet to be ordered for throat pain control.  12:00 AM Labs reviewed. They are reassuring and do not suggest acute dehydration. Will PO challenge.  12:34 AM Patient asked nurse to call this writer to the room. Upon my entering the patient states, "I am mad at you. Why weren't you my doctor the last time I came? I can actually drink now! I didn't think I could drink!". Patient states that his pain has significantly improved with Decadron and Toradol. He is on his second cup of water. He has had no signs of repeat bleeding. He expresses thanks for care and comfort with discharge. Will provide 5 day course of prednisone given hx of intolerance to ibuprofen. Patient advised to continue Hycet and to f/u with Dr. Jenne PaneBates. Keppra liquid Rx provided as well to prevent noncompliance with medication regimen.   Return precautions discussed and provided. Patient discharged in satisfactory condition with no unaddressed concerns.   Final Clinical Impressions(s) / ED Diagnoses   Final diagnoses:  Postoperative pain  Dysphagia    Vitals:   05/07/16 2215 05/07/16 2304 05/07/16 2330 05/08/16 0013  BP: 138/85 130/74 132/75 136/71  Pulse: 74 73 85 80  Resp:      Temp:      TempSrc:      SpO2: 98% 98% 98% 97%    New Prescriptions New Prescriptions   LEVETIRACETAM (KEPPRA) 100 MG/ML SOLUTION    Take 5 mLs (500 mg total) by mouth 2 (two) times daily.   PREDNISOLONE (PRELONE) 15 MG/5ML SYRUP    Take 10 mLs (30 mg total) by mouth daily.     Antony MaduraKelly Sarafina Puthoff, PA-C 05/08/16 [id number]    Bethann BerkshireJoseph Zammit, MD 05/08/16 [id number]

## 2016-05-08 MED ORDER — PREDNISOLONE 15 MG/5ML PO SYRP: 30.0000 mg | ORAL_SOLUTION | Freq: Every day | ORAL | 0 refills | Status: AC

## 2016-05-08 MED ORDER — LEVETIRACETAM 100 MG/ML PO SOLN: 500.0000 mg | Freq: Two times a day (BID) | ORAL | 0 refills | Status: DC

## 2016-05-08 NOTE — Discharge Instructions (Signed)
Be sure to drink plenty of water and clear liquids to prevent dehydration. Take Liquid Keppra if you are unable to tolerate your Keppra tablets. Take prednisone for 5 days to try and further limit swelling. Continue taking Hydrocodone liquid for pain. Call Dr. Jenne PaneBates to schedule follow up for a recheck of your healing. Continue to apply ice packs to the front and sides of your neck MULTIPLE times per day as this will also help with swelling/inflammation.

## 2016-05-08 NOTE — ED Notes (Signed)
Pt stable, states understanding of discharge instructions, caregiver at bedside 

## 2016-05-16 ENCOUNTER — Telehealth (HOSPITAL_COMMUNITY): Payer: Self-pay

## 2016-05-16 ENCOUNTER — Ambulatory Visit (HOSPITAL_COMMUNITY): Payer: Self-pay | Admitting: Licensed Clinical Social Worker

## 2016-05-16 NOTE — Telephone Encounter (Signed)
05/16/16 2:54pm Patient called stating that he would be at 15mins late asked if he already fill-out paperwork - pt stated "NO" infomed pt about r/s pt stated that he was out of his medication for about 333-months and needed his meds - [facility]Marland Kitchen.Marguerite Olea/sh

## 2016-05-17 ENCOUNTER — Ambulatory Visit: Payer: Medicaid Other | Attending: Family Medicine | Admitting: Family Medicine

## 2016-05-17 ENCOUNTER — Encounter: Payer: Self-pay | Admitting: Family Medicine

## 2016-05-17 ENCOUNTER — Ambulatory Visit: Payer: Self-pay | Admitting: Family Medicine

## 2016-05-17 VITALS — BP 122/83 | HR 88 | Temp 98.6°F | Wt 314.2 lb

## 2016-05-17 DIAGNOSIS — E669 Obesity, unspecified: Secondary | ICD-10-CM | POA: Diagnosis not present

## 2016-05-17 DIAGNOSIS — F418 Other specified anxiety disorders: Secondary | ICD-10-CM | POA: Diagnosis not present

## 2016-05-17 DIAGNOSIS — M791 Myalgia: Secondary | ICD-10-CM | POA: Diagnosis not present

## 2016-05-17 DIAGNOSIS — R202 Paresthesia of skin: Secondary | ICD-10-CM | POA: Insufficient documentation

## 2016-05-17 DIAGNOSIS — Z993 Dependence on wheelchair: Secondary | ICD-10-CM | POA: Diagnosis not present

## 2016-05-17 DIAGNOSIS — R569 Unspecified convulsions: Secondary | ICD-10-CM | POA: Diagnosis not present

## 2016-05-17 DIAGNOSIS — Z6841 Body Mass Index (BMI) 40.0 and over, adult: Secondary | ICD-10-CM | POA: Insufficient documentation

## 2016-05-17 MED ORDER — CYANOCOBALAMIN 1000 MCG/ML IJ SOLN
1000.0000 ug | Freq: Once | INTRAMUSCULAR | Status: AC
  Administered 2016-05-17: 1000 ug via INTRAMUSCULAR

## 2016-05-17 NOTE — Progress Notes (Signed)
Subjective:  Patient ID: Donald CarnesWilliam Wayde Cathleen Cortischaak Jr., male    DOB: [birth year]  Age: 25 y.o. MRN: [id number]  CC: Follow-up (power wheel chair evaluation)   HPI Donald CarnesWilliam Wayde Huckaba Montez HagemanJr. is a 25 year old male with a history of paresthesia, myalgias who is currently being worked up by [facility]y [facility] for ? Guillain-Barr syndrome who comes in today to receive a B12 injection ordered by neurology. His vitamin B12 levels which were previously tested here in the clinic have been normal so far.  I had written him a prescription for a manual wheelchair even though he had requested a power wheelchair at a previous visit because I had felt he did not qualify for a power wheelchair but he comes in today with a form for a power mobility exam and I have again explained to him that I am of the opinion that he is able to operate a manual wheelchair and does not need a power wheelchair.  Past Medical History:  Diagnosis Date  . Anxiety   . Depression 2012   History of self injury.  Reyes Ivan. Guillain Barr syndrome [facility](HCC)    Suspect not actually GBS, but rather conversion disorder([facility])  . Seizures (HCC)     Past Surgical History:  Procedure Laterality Date  . Nasal Cauterization    . TONSILLECTOMY       Outpatient Medications Prior to Visit  Medication Sig Dispense Refill  . cetirizine (ZYRTEC) 10 MG tablet Take 1 tablet (10 mg total) by mouth daily. 30 tablet 3  . cyanocobalamin (,VITAMIN B-12,) 1000 MCG/ML injection Inject 1 mL (1,000 mcg total) into the muscle every 30 (thirty) days. 1 mL 3  . gabapentin (NEURONTIN) 100 MG capsule Take 100 mg by mouth 3 (three) times daily.    Marland Kitchen. levETIRAcetam (KEPPRA) 100 MG/ML solution Take 5 mLs (500 mg total) by mouth 2 (two) times daily. 473 mL 0  . Lurasidone HCl (LATUDA) 60 MG TABS Take 120 mg by mouth daily. Reported on 03/14/2016    . methocarbamol (ROBAXIN-750) 750 MG tablet Take 1 tablet (750 mg  total) by mouth 2 (two) times daily as needed for muscle spasms. 60 tablet 2  . omeprazole (PRILOSEC) 20 MG capsule Take 1 capsule (20 mg total) by mouth daily. 30 capsule 3  . prednisoLONE (PRELONE) 15 MG/5ML syrup Take 10 mLs (30 mg total) by mouth daily. 100 mL 0  . QUEtiapine (SEROQUEL) 50 MG tablet Take 50 mg by mouth at bedtime. Reported on 03/14/2016    . traZODone (DESYREL) 100 MG tablet Take 100 mg by mouth at bedtime. Reported on 03/14/2016     No facility-administered medications prior to visit.     ROS Review of Systems Constitutional: Negative for activity change and appetite change.  positive for fatigue. HENT: Negative for sinus pressure and sore throat, Positive for postnasal drip and nasal congestion.  Eyes: Negative for visual disturbance.  Respiratory: Negative for cough, chest tightness and shortness of breath.   Cardiovascular: Negative for chest pain and leg swelling.  Gastrointestinal: Positive for abdominal pain, negative for diarrhea, constipation and abdominal distention.  Endocrine: Negative.   Genitourinary: Negative for dysuria.  Musculoskeletal: Negative for myalgias and joint swelling. Positive for back pain Skin: Negative for rash.  Allergic/Immunologic: Negative.   Neurological: Positive for seizures and weakness worse in the left leg. Negative for light-headedness and positive for numbness.  Psychiatric/Behavioral: Negative for suicidal ideas and dysphoric mood.  Objective:  BP 122/83 (  BP Location: Right Arm, Patient Position: Sitting, Cuff Size: Large)   Pulse 88   Temp 98.6 F (37 C) (Oral)   Wt (!) 314 lb 3.2 oz (142.5 kg)   SpO2 98%   BMI 41.45 kg/m   BP/Weight 05/17/2016 05/08/2016 05/03/2016  Systolic BP 122 136 126  Diastolic BP 83 71 74  Wt. (Lbs) 314.2 - 315  BMI 41.45 - 41.56      Physical Exam Constitutional: He is oriented to person, place, and time. He appears well-developed and well-nourished.  Obese  Cardiovascular: Normal  rate, normal heart sounds and intact distal pulses.   No murmur heard. Pulmonary/Chest: Effort normal and breath sounds normal. He has no wheezes. He has no rales. He exhibits no tenderness.  Abdominal: Soft. Bowel sounds are normal. He exhibits no distension and no mass. There is no tenderness.  Musculoskeletal: Normal range of motion.  Neurological: He is alert and oriented to person, place, and time.  LLE strength -3+/5, RLE strength- 5/5 LUE strength -3+/5, RUE strength-5/5 Dysesthesia in bilateral lower extremity Psychiatric: He has a normal mood and affect.   Assessment & Plan:   1. Paresthesia Continue B12 injections Follow-up with neurology High risk for falls due to myalgias; use manual wheelchair.   No orders of the defined types were placed in this encounter.   Follow-up: Return in about 3 months (around 08/17/2016) for Follow-up on myalgias.   Jaclyn Shaggy MD

## 2016-06-07 ENCOUNTER — Ambulatory Visit: Payer: Self-pay

## 2016-06-20 ENCOUNTER — Ambulatory Visit: Payer: Medicaid Other | Attending: Family Medicine

## 2016-06-20 ENCOUNTER — Ambulatory Visit (HOSPITAL_COMMUNITY)
Admission: EM | Admit: 2016-06-20 | Discharge: 2016-06-20 | Discharge status: Against Medical Advice | Payer: Medicaid Other

## 2016-06-20 NOTE — ED Notes (Signed)
Patient called and failed in lobby.

## 2016-06-29 ENCOUNTER — Telehealth: Payer: Self-pay | Admitting: Family Medicine

## 2016-07-01 ENCOUNTER — Ambulatory Visit (HOSPITAL_COMMUNITY)
Admission: EM | Admit: 2016-07-01 | Discharge: 2016-07-01 | Disposition: A | Discharge status: Home / Self Care | Payer: Medicaid Other | Attending: Emergency Medicine | Admitting: Emergency Medicine

## 2016-07-01 ENCOUNTER — Telehealth: Payer: Self-pay | Admitting: Family Medicine

## 2016-07-01 ENCOUNTER — Encounter (HOSPITAL_COMMUNITY): Payer: Self-pay | Admitting: Emergency Medicine

## 2016-07-01 DIAGNOSIS — T7840XA Allergy, unspecified, initial encounter: Secondary | ICD-10-CM

## 2016-07-01 DIAGNOSIS — Z77120 Contact with and (suspected) exposure to mold (toxic): Secondary | ICD-10-CM | POA: Diagnosis not present

## 2016-07-01 DIAGNOSIS — J302 Other seasonal allergic rhinitis: Secondary | ICD-10-CM | POA: Diagnosis not present

## 2016-07-01 MED ORDER — CETIRIZINE HCL 10 MG PO TABS: 10.0000 mg | ORAL_TABLET | Freq: Every day | ORAL | 3 refills | Status: DC

## 2016-07-01 NOTE — ED Provider Notes (Signed)
CSN: [id number]     Arrival date & time 07/01/16  1938 History   First MD Initiated Contact with Patient 07/01/16 2137     Chief Complaint  Patient presents with  . Sore Throat   (Consider location/radiation/quality/duration/timing/severity/associated sxs/prior Treatment) 25 yo patient with GBS in wheelchair presents with reported exposure to mold in his apartment and he states that when he's home his throat is sore and feels "swollen". This has been ongoing over the last 2 weeks.  When he leaves his apartment the symptoms go away. He has no complaints at this visit other than being tired. He reports that he was told to go to the ED. He denies dyspnea. He is a smoker.       Past Medical History:  Diagnosis Date  . Anxiety   . Depression 2012   History of self injury.  Donald Donald syndrome [facility])    Suspect not actually GBS, but rather conversion disorder(see hospitalization at [facility] 05/2015)  . Seizures (HCC)    Past Surgical History:  Procedure Laterality Date  . Nasal Cauterization    . TONSILLECTOMY     Family History  Problem Relation Age of Onset  . Cancer Mother   . Stroke Maternal Grandmother    Social History  Substance Use Topics  . Smoking status: Current Every Day Smoker    Packs/day: 0.25    Types: Cigarettes  . Smokeless tobacco: Never Used  . Alcohol use Yes     Comment: occasional     Review of Systems  Constitutional: Positive for fatigue. Negative for chills and fever.  HENT: Negative for congestion and sore throat.   Eyes: Negative for discharge.  Respiratory: Negative for apnea, cough, shortness of breath, wheezing and stridor.   Skin: Negative.   Allergic/Immunologic: Positive for environmental allergies.  Psychiatric/Behavioral: Negative.     Allergies  Ibuprofen and Fluzone  [flu virus vaccine]  Home Medications   Prior to Admission medications   Medication Sig Start Date End Date Taking? Authorizing Provider  cyanocobalamin  (,VITAMIN B-12,) 1000 MCG/ML injection Inject 1 mL (1,000 mcg total) into the muscle every 30 (thirty) days. 02/08/16  Yes Jaclyn Shaggy, MD  gabapentin (NEURONTIN) 100 MG capsule Take 100 mg by mouth 3 (three) times daily.   Yes Historical Provider, MD  levETIRAcetam (KEPPRA) 100 MG/ML solution Take 5 mLs (500 mg total) by mouth 2 (two) times daily. 05/08/16  Yes Antony Madura, PA-C  Lurasidone HCl (LATUDA) 60 MG TABS Take 120 mg by mouth daily. Reported on 03/14/2016   Yes Historical Provider, MD  methocarbamol (ROBAXIN-750) 750 MG tablet Take 1 tablet (750 mg total) by mouth 2 (two) times daily as needed for muscle spasms. 03/14/16  Yes Jaclyn Shaggy, MD  omeprazole (PRILOSEC) 20 MG capsule Take 1 capsule (20 mg total) by mouth daily. 03/14/16  Yes Jaclyn Shaggy, MD  QUEtiapine (SEROQUEL) 50 MG tablet Take 50 mg by mouth at bedtime. Reported on 03/14/2016   Yes Historical Provider, MD  traZODone (DESYREL) 100 MG tablet Take 100 mg by mouth at bedtime. Reported on 03/14/2016   Yes Historical Provider, MD  cetirizine (ZYRTEC) 10 MG tablet Take 1 tablet (10 mg total) by mouth daily. 07/01/16   Riki Sheer, PA-C   Meds Ordered and Administered this Visit  Medications - No data to display  BP 123/77 (BP Location: Right Arm)   Pulse 85   Temp 98.7 F (37.1 C) (Oral)   Resp 12   SpO2 99%  No data found.   Physical Exam  Constitutional: He is oriented to person, place, and time. He appears well-developed and well-nourished. No distress.  HENT:  Mouth/Throat: Oropharynx is clear and moist.  Oropharynx is clear without exudate, no swelling is noted. No trachea deviation  Neck: No tracheal deviation present.  Cardiovascular: Normal rate and regular rhythm.   Pulmonary/Chest: Effort normal and breath sounds normal. No stridor. He has no wheezes.  Neurological: He is alert and oriented to person, place, and time.  Skin: Skin is warm and dry. He is not diaphoretic.  Psychiatric: His behavior is normal.   Nursing note and vitals reviewed.   Urgent Care Course   Clinical Course    Procedures (including critical care time)  Labs Review Labs Reviewed - No data to display  Imaging Review No results found.   Visual Acuity Review  Right Eye Distance:   Left Eye Distance:   Bilateral Distance:    Right Eye Near:   Left Eye Near:    Bilateral Near:         MDM   1. Allergic state, initial encounter   2. Mold suspected exposure   3. Seasonal allergies    Patient is asymptomatic. No emergent needs are noted on exam. I did restart his Claritin and he must address his landlord about the situation. He has no findings of a respiratory illness secondary to mold. A letter was requested and given. F/U with PCP    Riki SheerMichelle G Khalifa Knecht, PA-C 07/01/16 2222

## 2016-07-01 NOTE — Discharge Instructions (Signed)
If you are having symptoms while in your home, then you will need to remove yourself from this. Ok to start Zyrtec to help with the symptoms you are having, but ultimately will need this resolved.

## 2016-07-01 NOTE — ED Triage Notes (Signed)
[facility] stating that his physician told him to come to the Emergency Department for possible mold exposure over the last 2 weeks. The patient stated that he was "too impatient" to go to the ED. The patient stated that he has had a sore throat and felt like his throat has been closing for 2 weeks.

## 2016-07-01 NOTE — Telephone Encounter (Signed)
Patient is calling to report their desire to switch providers. Patient does not feel that current pcp is treating health problem as best. Patient stated that current pcp has denied request for a wheelchair, as well as in home care for health issues Patient had myopathy and Guillain-Barr syndrome.  Patient also said that his house has mold, and that this is affecting his breathing. Patient said that it feels like his nose is closing.

## 2016-07-20 ENCOUNTER — Telehealth: Payer: Self-pay | Admitting: Family Medicine

## 2016-07-20 DIAGNOSIS — K2970 Gastritis, unspecified, without bleeding: Secondary | ICD-10-CM

## 2016-07-20 DIAGNOSIS — K2990 Gastroduodenitis, unspecified, without bleeding: Principal | ICD-10-CM

## 2016-07-20 MED ORDER — OMEPRAZOLE 20 MG PO CPDR: 20.0000 mg | DELAYED_RELEASE_CAPSULE | Freq: Every day | ORAL | 3 refills | Status: DC

## 2016-07-20 NOTE — Telephone Encounter (Signed)
Pt. Called requesting a refill on omeprazole (PRILOSEC) 20 MG capsule  Please f/u with pt.

## 2016-07-20 NOTE — Telephone Encounter (Signed)
Omeprazole refilled.

## 2016-07-28 ENCOUNTER — Ambulatory Visit: Payer: Medicaid Other | Attending: Family Medicine

## 2016-07-28 DIAGNOSIS — E538 Deficiency of other specified B group vitamins: Secondary | ICD-10-CM

## 2016-07-28 MED ORDER — CYANOCOBALAMIN 1000 MCG/ML IJ SOLN
1000.0000 ug | Freq: Once | INTRAMUSCULAR | Status: AC
  Administered 2016-07-28: 1000 ug via INTRAMUSCULAR

## 2016-08-04 ENCOUNTER — Encounter (HOSPITAL_COMMUNITY): Payer: Self-pay | Admitting: Emergency Medicine

## 2016-08-04 ENCOUNTER — Emergency Department (HOSPITAL_COMMUNITY)
Admission: EM | Admit: 2016-08-04 | Discharge: 2016-08-04 | Disposition: A | Discharge status: Home / Self Care | Payer: Medicaid Other | Attending: Emergency Medicine | Admitting: Emergency Medicine

## 2016-08-04 DIAGNOSIS — F17210 Nicotine dependence, cigarettes, uncomplicated: Secondary | ICD-10-CM | POA: Diagnosis not present

## 2016-08-04 DIAGNOSIS — Z79899 Other long term (current) drug therapy: Secondary | ICD-10-CM | POA: Insufficient documentation

## 2016-08-04 DIAGNOSIS — M549 Dorsalgia, unspecified: Secondary | ICD-10-CM | POA: Diagnosis not present

## 2016-08-04 LAB — CBC WITH DIFFERENTIAL/PLATELET
BASOS ABS: 0 10*3/uL (ref 0.0–0.1)
BASOS PCT: 0 %
Eosinophils Absolute: 0.3 10*3/uL (ref 0.0–0.7)
Eosinophils Relative: 2 %
HEMATOCRIT: 48.6 % (ref 39.0–52.0)
HEMOGLOBIN: 17 g/dL (ref 13.0–17.0)
LYMPHS PCT: 25 %
Lymphs Abs: 2.6 10*3/uL (ref 0.7–4.0)
MCH: 31.4 pg (ref 26.0–34.0)
MCHC: 35 g/dL (ref 30.0–36.0)
MCV: 89.8 fL (ref 78.0–100.0)
Monocytes Absolute: 0.5 10*3/uL (ref 0.1–1.0)
Monocytes Relative: 5 %
NEUTROS ABS: 7 10*3/uL (ref 1.7–7.7)
NEUTROS PCT: 68 %
Platelets: 277 10*3/uL (ref 150–400)
RBC: 5.41 MIL/uL (ref 4.22–5.81)
RDW: 13 % (ref 11.5–15.5)
WBC: 10.4 10*3/uL (ref 4.0–10.5)

## 2016-08-04 LAB — BASIC METABOLIC PANEL
ANION GAP: 7 (ref 5–15)
BUN: 9 mg/dL (ref 6–20)
CO2: 24 mmol/L (ref 22–32)
Calcium: 9.7 mg/dL (ref 8.9–10.3)
Chloride: 106 mmol/L (ref 101–111)
Creatinine, Ser: 0.79 mg/dL (ref 0.61–1.24)
GLUCOSE: 82 mg/dL (ref 65–99)
POTASSIUM: 3.8 mmol/L (ref 3.5–5.1)
SODIUM: 137 mmol/L (ref 135–145)

## 2016-08-04 LAB — URINE MICROSCOPIC-ADD ON: RBC / HPF: NONE SEEN RBC/hpf (ref 0–5)

## 2016-08-04 LAB — URINALYSIS, ROUTINE W REFLEX MICROSCOPIC
Bilirubin Urine: NEGATIVE
GLUCOSE, UA: NEGATIVE mg/dL
Hgb urine dipstick: NEGATIVE
Ketones, ur: NEGATIVE mg/dL
NITRITE: NEGATIVE
PH: 6 (ref 5.0–8.0)
Protein, ur: NEGATIVE mg/dL
Specific Gravity, Urine: 1.021 (ref 1.005–1.030)

## 2016-08-04 MED ORDER — KETOROLAC TROMETHAMINE 30 MG/ML IJ SOLN
30.0000 mg | Freq: Once | INTRAMUSCULAR | Status: AC
  Administered 2016-08-04: 30 mg via INTRAMUSCULAR
  Filled 2016-08-04: qty 1

## 2016-08-04 MED ORDER — HYDROCODONE-ACETAMINOPHEN 5-325 MG PO TABS: 1.0000 | ORAL_TABLET | Freq: Four times a day (QID) | ORAL | 0 refills | Status: DC | PRN

## 2016-08-04 NOTE — ED Provider Notes (Signed)
[facility]-EMERGENCY DEPT Provider Note   CSN: [id number] Arrival date & time: 08/04/16  1539     History   Chief Complaint Chief Complaint  Patient presents with  . Back Pain  . Weakness    HPI Donald Mckay. is a 25 y.o. male.  The history is provided by the patient and medical records. No language interpreter was used.    Patient with hx of anxiety, depression, Guillain-Barre vs conversion disorder, and seizures who presents to the ED for mid-central back pain x 2 days which radiates up to neck and down to low back. Associated symptoms include headache. He also states that last night his left leg became weak and "gave out". He notes baseline numbness of bilateral LE's for a year now. He has taken robaxin with little relief. Denies injury or trauma. Denies fever, chills, saddle paresthesias, bowel/bladder incontinence.  Extensive chart review shows patient was initially hospitalized at [facility] and received IVIG for presumed Guillain Barre syndrome after he had presented with lower extremity weakness, paresthesias and was later transferred to [facility] where he was hospitalized from 06/01/15-06/24/15. He had complained of bilateral lower extremity weakness, lower extremity sensation loss, headaches and blurry vision and he underwent several workups including EMG, lumbar puncture, MRI, serum electrophoresis and viral causes of myelopathy explored, HTLV screen came back negative. Never required vent. He was evaluated by multiple specialties including neurology, neurosurgery, psychiatry and physical therapy. He was transferred to physical therapy where he underwent rehabilitation and received nursing care. 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 He was commenced on Prozac prior to discharge and recommended to follow-up with psychiatry outpatient as well as neurology. He states that he has been following up with neurology as he was recommended to do.   Past  Medical History:  Diagnosis Date  . Anxiety   . Depression 2012   History of self injury.  Reyes Ivan syndrome [facility])    Suspect not actually GBS, but rather conversion disorder(see hospitalization at [facility] 05/2015)  . Seizures [facility])     Patient Active Problem List   Diagnosis Date Noted  . Gastritis and gastroduodenitis 03/14/2016  . Vitamin D deficiency 12/02/2015  . Chest pain 11/21/2015  . Seizures (HCC) 10/09/2015  . Recurrent tonsillitis 10/09/2015  . Obesity 10/09/2015  . Back pain 09/02/2015  . 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 Headache 07/09/2015  . Paresthesia 07/07/2015  . 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 Encounter for lumbar puncture   . Depression 05/15/2015  . Tobacco use disorder 05/12/2015    Past Surgical History:  Procedure Laterality Date  . Nasal Cauterization    . TONSILLECTOMY         Home Medications    Prior to Admission medications   Medication Sig Start Date End Date Taking? Authorizing Provider  cetirizine (ZYRTEC) 10 MG tablet Take 1 tablet (10 mg total) by mouth daily. 07/01/16  Yes Riki Sheer, PA-C  cyanocobalamin (,VITAMIN B-12,) 1000 MCG/ML injection Inject 1 mL (1,000 mcg total) into the muscle every 30 (thirty) days. 02/08/16  Yes Jaclyn Shaggy, MD  gabapentin (NEURONTIN) 100 MG capsule Take 100 mg by mouth 3 (three) times daily.   Yes Historical Provider, MD  levETIRAcetam (KEPPRA) 500 MG tablet Take 500 mg by mouth 2 (two) times daily.   Yes Historical Provider, MD  Lurasidone HCl (LATUDA) 60 MG TABS Take 120 mg by mouth daily.    Yes Historical Provider, MD  methocarbamol (ROBAXIN-750) 750 MG tablet  Take 1 tablet (750 mg total) by mouth 2 (two) times daily as needed for muscle spasms. 03/14/16  Yes Jaclyn ShaggyEnobong [name], MD  omeprazole (PRILOSEC) 20 MG capsule Take 1 capsule (20 mg total) by mouth daily. 07/20/16  Yes Jaclyn ShaggyEnobong [name], MD  QUEtiapine (SEROQUEL) 50 MG tablet Take 50 mg by mouth at bedtime.    Yes Historical Provider, MD  traZODone  (DESYREL) 100 MG tablet Take 100 mg by mouth at bedtime.    Yes Historical Provider, MD  HYDROcodone-acetaminophen (NORCO/VICODIN) 5-325 MG tablet Take 1 tablet by mouth every 6 (six) hours as needed. 08/04/16   Chase PicketJaime Pilcher Demetris Meinhardt, PA-C    Family History Family History  Problem Relation Age of Onset  . Cancer Mother   . Stroke Maternal Grandmother     Social History Social History  Substance Use Topics  . Smoking status: Current Every Day Smoker    Packs/day: 0.25    Types: Cigarettes  . Smokeless tobacco: Never Used  . Alcohol use No     Comment: occasional      Allergies   Ibuprofen and Fluzone [flu virus vaccine]   Review of Systems Review of Systems  Constitutional: Negative for chills and fever.  HENT: Negative for congestion.   Eyes: Negative for visual disturbance.  Respiratory: Negative for cough and shortness of breath.   Cardiovascular: Negative.   Gastrointestinal: Negative for abdominal pain, nausea and vomiting.  Genitourinary: Negative for dysuria.  Musculoskeletal: Positive for back pain and neck pain. Negative for neck stiffness.  Skin: Negative for color change and rash.  Neurological: Positive for weakness (Left leg) and headaches. Negative for syncope.     Physical Exam Updated Vital Signs BP 154/80 (BP Location: Right Arm)   Pulse 88   Temp 98.5 F (36.9 C) (Oral)   Resp 18   Ht 6\' 1"  (1.854 m)   Wt (!) 142.4 kg   SpO2 95%   BMI 41.43 kg/m   Physical Exam  Constitutional: He is oriented to person, place, and time. He appears well-developed and well-nourished. No distress.  HENT:  Head: Normocephalic and atraumatic.  Neck:  No meningeal signs. Full range of motion without pain.  Cardiovascular: Normal rate, regular rhythm and normal heart sounds.   No murmur heard. Intact and equal distal pulses.  Pulmonary/Chest: Effort normal and breath sounds normal. No respiratory distress.  Abdominal: Soft. He exhibits no distension. There is no  tenderness.  Musculoskeletal: He exhibits no edema.       Arms: TTP as depicted in image. Bilateral UE with 5/5 muscle strength including grip strength. Bilateral LE with 4/5 muscle strength. Negative straight leg raises bilaterally.   Neurological: He is alert and oriented to person, place, and time.  Bilateral LE's with decreased sensation to light touch and pinprick which patient states is baseline. CN 2-12 grossly intact. Normal rapid alternating movements and finger to nose.  Skin: Skin is warm and dry.  Nursing note and vitals reviewed.    ED Treatments / Results  Labs (all labs ordered are listed, but only abnormal results are displayed) Labs Reviewed  URINALYSIS, ROUTINE W REFLEX MICROSCOPIC (NOT AT [facility]) - Abnormal; Notable for the following:       Result Value   APPearance CLOUDY (*)    Leukocytes, UA SMALL (*)    All other components within normal limits  URINE MICROSCOPIC-ADD ON - Abnormal; Notable for the following:    Squamous Epithelial / LPF 0-5 (*)    Bacteria, UA RARE (*)  All other components within normal limits  BASIC METABOLIC PANEL  CBC WITH DIFFERENTIAL/PLATELET    EKG  EKG Interpretation None       Radiology No results found.  Procedures Procedures (including critical care time)  Medications Ordered in ED Medications  ketorolac (TORADOL) 30 MG/ML injection 30 mg (30 mg Intramuscular Given 08/04/16 1854)     Initial Impression / Assessment and Plan / ED Course  I have reviewed the triage vital signs and the nursing notes.  Pertinent labs & imaging results that were available during my care of the patient were reviewed by me and considered in my medical decision making (see chart for details).  Clinical Course    Donald CarnesWilliam Wayde Sipe Montez HagemanJr. is a 25 y.o. male who presents to ED for back pain x 2 days. HX of ? Guillain Barre vs. Conversion disorder. Chart was reviewed extensively and records from primary care, neurology and prior  hospitalizations reviewed. On exam today, patient has no focal deficits. He does have decreased sensation to the bilateral lower extremities, however he states that this is baseline. He is afebrile and nontoxic appearing. He exhibits no meningeal signs. He endorses weakness to the left lower extremity. Bilateral LE's with 4/5 muscle strength which appears baseline when compared to notes from several visits. CBC, BMP and urine reassuring. Post-void residual bladder scan with < 150 cc. No red flag symptoms of back pain. Evaluation does not show pathology that would require ongoing emergent intervention or inpatient treatment. Patient should follow up with neurology and agrees to do so. Return precautions discussed and all questions answered.    Patient discussed with Dr. Ranae PalmsYelverton who agrees with treatment plan.   Final Clinical Impressions(s) / ED Diagnoses   Final diagnoses:  Acute midline back pain, unspecified back location    New Prescriptions Discharge Medication List as of 08/04/2016  8:15 PM    START taking these medications   Details  HYDROcodone-acetaminophen (NORCO/VICODIN) 5-325 MG tablet Take 1 tablet by mouth every 6 (six) hours as needed., Starting Thu 08/04/2016, Print         CIT GroupJaime Pilcher Sheyna Pettibone, PA-C 08/04/16 2032    Loren Raceravid Yelverton, MD 08/12/16 [phone]

## 2016-08-04 NOTE — ED Triage Notes (Addendum)
Patient reports back pain x 2 days. Patient has taken robaxin without relief. Patient reports left leg weakness x2 days. Hx of Guillain Barre Syndrome. Denies trauma/injury to the areas. Patient is speaking in full sentences and denies shortness of breath at this time. Vital signs stable. Triage nurses aware of patient and patient will be moved to the acute side.

## 2016-08-04 NOTE — Discharge Instructions (Signed)
Please follow up with your neurologist. I have listed the contact information below. Pain medication only as needed for severe pain - This can make you very drowsy - please do not drink alcohol, operate heavy machinery or drive on this medication. Return to ER for fevers, new or worsening symptoms, any additional concerns.   [facility] [address]   [place], [place] [id number]   [phone]

## 2016-08-16 ENCOUNTER — Encounter: Payer: Self-pay | Admitting: Licensed Clinical Social Worker

## 2016-08-16 ENCOUNTER — Ambulatory Visit: Payer: Medicaid Other | Attending: Internal Medicine | Admitting: Internal Medicine

## 2016-08-16 VITALS — BP 137/83 | HR 86 | Temp 98.8°F | Resp 20 | Wt 284.0 lb

## 2016-08-16 DIAGNOSIS — F17210 Nicotine dependence, cigarettes, uncomplicated: Secondary | ICD-10-CM | POA: Diagnosis not present

## 2016-08-16 DIAGNOSIS — G729 Myopathy, unspecified: Secondary | ICD-10-CM

## 2016-08-16 DIAGNOSIS — M62838 Other muscle spasm: Secondary | ICD-10-CM | POA: Diagnosis not present

## 2016-08-16 DIAGNOSIS — F329 Major depressive disorder, single episode, unspecified: Secondary | ICD-10-CM | POA: Insufficient documentation

## 2016-08-16 DIAGNOSIS — G610 Guillain-Barre syndrome: Secondary | ICD-10-CM | POA: Diagnosis not present

## 2016-08-16 DIAGNOSIS — T148XXA Other injury of unspecified body region, initial encounter: Secondary | ICD-10-CM

## 2016-08-16 DIAGNOSIS — F17200 Nicotine dependence, unspecified, uncomplicated: Secondary | ICD-10-CM

## 2016-08-16 DIAGNOSIS — E559 Vitamin D deficiency, unspecified: Secondary | ICD-10-CM

## 2016-08-16 DIAGNOSIS — F419 Anxiety disorder, unspecified: Secondary | ICD-10-CM | POA: Diagnosis not present

## 2016-08-16 DIAGNOSIS — A549 Gonococcal infection, unspecified: Secondary | ICD-10-CM | POA: Diagnosis not present

## 2016-08-16 DIAGNOSIS — H6120 Impacted cerumen, unspecified ear: Secondary | ICD-10-CM

## 2016-08-16 DIAGNOSIS — F12180 Cannabis abuse with cannabis-induced anxiety disorder: Secondary | ICD-10-CM | POA: Insufficient documentation

## 2016-08-16 DIAGNOSIS — R569 Unspecified convulsions: Secondary | ICD-10-CM | POA: Diagnosis not present

## 2016-08-16 DIAGNOSIS — R202 Paresthesia of skin: Secondary | ICD-10-CM

## 2016-08-16 DIAGNOSIS — Z23 Encounter for immunization: Secondary | ICD-10-CM | POA: Diagnosis not present

## 2016-08-16 MED ORDER — MELOXICAM 15 MG PO TABS
15.0000 mg | ORAL_TABLET | Freq: Two times a day (BID) | ORAL | 0 refills | Status: DC
Start: 2016-08-16 — End: 2016-10-21

## 2016-08-16 MED ORDER — LEVETIRACETAM 500 MG PO TABS: 500.0000 mg | ORAL_TABLET | Freq: Two times a day (BID) | ORAL | 3 refills | Status: DC

## 2016-08-16 MED ORDER — CEFTRIAXONE SODIUM 250 MG IJ SOLR
250.0000 mg | Freq: Once | INTRAMUSCULAR | Status: AC
  Administered 2016-08-16: 250 mg via INTRAMUSCULAR

## 2016-08-16 MED ORDER — AZITHROMYCIN 500 MG PO TABS
1000.0000 mg | ORAL_TABLET | Freq: Once | ORAL | Status: AC
  Administered 2016-08-16: 1000 mg via ORAL

## 2016-08-16 MED ORDER — CYCLOBENZAPRINE HCL 10 MG PO TABS: 10.0000 mg | ORAL_TABLET | Freq: Three times a day (TID) | ORAL | 0 refills | Status: DC | PRN

## 2016-08-16 MED ORDER — NICOTINE 21 MG/24HR TD PT24: 21.0000 mg | MEDICATED_PATCH | Freq: Every day | TRANSDERMAL | 0 refills | Status: DC

## 2016-08-16 MED ORDER — DICLOFENAC SODIUM 1 % TD GEL: 2.0000 g | Freq: Four times a day (QID) | TRANSDERMAL | 2 refills | Status: DC

## 2016-08-16 MED ORDER — GABAPENTIN 300 MG PO CAPS: 300.0000 mg | ORAL_CAPSULE | Freq: Three times a day (TID) | ORAL | 3 refills | Status: DC

## 2016-08-16 NOTE — Progress Notes (Signed)
Pt f/u with pain r/t medical condition, needs RF on Zyrtec and Trazadone. Pt arrived in wheelchair. Also stating he would like to receive assistance with medical equipment and assistance.

## 2016-08-16 NOTE — Patient Instructions (Addendum)
Steps to Quit Smoking Smoking tobacco can be bad for your health. It can also affect almost every organ in your body. Smoking puts you and people around you at risk for many serious long-lasting (chronic) diseases. Quitting smoking is hard, but it is one of the best things that you can do for your health. It is never too late to quit. What are the benefits of quitting smoking? When you quit smoking, you lower your risk for getting serious diseases and conditions. They can include:  Lung cancer or lung disease.  Heart disease.  Stroke.  Heart attack.  Not being able to have children (infertility).  Weak bones (osteoporosis) and broken bones (fractures). If you have coughing, wheezing, and shortness of breath, those symptoms may get better when you quit. You may also get sick less often. If you are pregnant, quitting smoking can help to lower your chances of having a baby of low birth weight. What can I do to help me quit smoking? Talk with your doctor about what can help you quit smoking. Some things you can do (strategies) include:  Quitting smoking totally, instead of slowly cutting back how much you smoke over a period of time.  Going to in-person counseling. You are more likely to quit if you go to many counseling sessions.  Using resources and support systems, such as:  Online chats with a counselor.  Phone quitlines.  Printed self-help materials.  Support groups or group counseling.  Text messaging programs.  Mobile phone apps or applications.  Taking medicines. Some of these medicines may have nicotine in them. If you are pregnant or breastfeeding, do not take any medicines to quit smoking unless your doctor says it is okay. Talk with your doctor about counseling or other things that can help you. Talk with your doctor about using more than one strategy at the same time, such as taking medicines while you are also going to in-person counseling. This can help make quitting  easier. What things can I do to make it easier to quit? Quitting smoking might feel very hard at first, but there is a lot that you can do to make it easier. Take these steps:  Talk to your family and friends. Ask them to support and encourage you.  Call phone quitlines, reach out to support groups, or work with a counselor.  Ask people who smoke to not smoke around you.  Avoid places that make you want (trigger) to smoke, such as:  Bars.  Parties.  Smoke-break areas at work.  Spend time with people who do not smoke.  Lower the stress in your life. Stress can make you want to smoke. Try these things to help your stress:  Getting regular exercise.  Deep-breathing exercises.  Yoga.  Meditating.  Doing a body scan. To do this, close your eyes, focus on one area of your body at a time from head to toe, and notice which parts of your body are tense. Try to relax the muscles in those areas.  Download or buy apps on your mobile phone or tablet that can help you stick to your quit plan. There are many free apps, such as QuitGuide from the CDC (Centers for Disease Control and Prevention). You can find more support from smokefree.gov and other websites. This information is not intended to replace advice given to you by your health care provider. Make sure you discuss any questions you have with your health care provider. Document Released: 07/02/2009 Document Revised: 05/03/2016 Document   Reviewed: 01/20/2015 Elsevier Interactive Patient Education  2017 Elsevier Inc.   -  Health Maintenance, Male A healthy lifestyle and preventative care can promote health and wellness.  Maintain regular health, dental, and eye exams.  Eat a healthy diet. Foods like vegetables, fruits, whole grains, low-fat dairy products, and lean protein foods contain the nutrients you need and are low in calories. Decrease your intake of foods high in solid fats, added sugars, and salt. Get information about a  proper diet from your health care provider, if necessary.  Regular physical exercise is one of the most important things you can do for your health. Most adults should get at least 150 minutes of moderate-intensity exercise (any activity that increases your heart rate and causes you to sweat) each week. In addition, most adults need muscle-strengthening exercises on 2 or more days a week.   Maintain a healthy weight. The body mass index (BMI) is a screening tool to identify possible weight problems. It provides an estimate of body fat based on height and weight. Your health care provider can find your BMI and can help you achieve or maintain a healthy weight. For males 20 years and older:  A BMI below 18.5 is considered underweight.  A BMI of 18.5 to 24.9 is normal.  A BMI of 25 to 29.9 is considered overweight.  A BMI of 30 and above is considered obese.  Maintain normal blood lipids and cholesterol by exercising and minimizing your intake of saturated fat. Eat a balanced diet with plenty of fruits and vegetables. Blood tests for lipids and cholesterol should begin at age 47 and be repeated every 5 years. If your lipid or cholesterol levels are high, you are over age 90, or you are at high risk for heart disease, you may need your cholesterol levels checked more frequently.Ongoing high lipid and cholesterol levels should be treated with medicines if diet and exercise are not working.  If you smoke, find out from your health care provider how to quit. If you do not use tobacco, do not start.  Lung cancer screening is recommended for adults aged 55-80 years who are at high risk for developing lung cancer because of a history of smoking. A yearly low-dose CT scan of the lungs is recommended for people who have at least a 30-pack-year history of smoking and are current smokers or have quit within the past 15 years. A pack year of smoking is smoking an average of 1 pack of cigarettes a day for 1 year  (for example, a 30-pack-year history of smoking could mean smoking 1 pack a day for 30 years or 2 packs a day for 15 years). Yearly screening should continue until the smoker has stopped smoking for at least 15 years. Yearly screening should be stopped for people who develop a health problem that would prevent them from having lung cancer treatment.  If you choose to drink alcohol, do not have more than 2 drinks per day. One drink is considered to be 12 oz (360 mL) of beer, 5 oz (150 mL) of wine, or 1.5 oz (45 mL) of liquor.  Avoid the use of street drugs. Do not share needles with anyone. Ask for help if you need support or instructions about stopping the use of drugs.  High blood pressure causes heart disease and increases the risk of stroke. High blood pressure is more likely to develop in:  People who have blood pressure in the end of the normal range (100-139/85-89 mm  Hg).  People who are overweight or obese.  People who are African American.  If you are 5618-24 years of age, have your blood pressure checked every 3-5 years. If you are 24 years of age or older, have your blood pressure checked every year. You should have your blood pressure measured twice-once when you are at a hospital or clinic, and once when you are not at a hospital or clinic. Record the average of the two measurements. To check your blood pressure when you are not at a hospital or clinic, you can use:  An automated blood pressure machine at a pharmacy.  A home blood pressure monitor.  If you are 2945-24 years old, ask your health care provider if you should take aspirin to prevent heart disease.  Diabetes screening involves taking a blood sample to check your fasting blood sugar level. This should be done once every 3 years after age 24 if you are at a normal weight and without risk factors for diabetes. Testing should be considered at a younger age or be carried out more frequently if you are overweight and have at least  1 risk factor for diabetes.  Colorectal cancer can be detected and often prevented. Most routine colorectal cancer screening begins at the age of 24 and continues through age 24. However, your health care provider may recommend screening at an earlier age if you have risk factors for colon cancer. On a yearly basis, your health care provider may provide home test kits to check for hidden blood in the stool. A small camera at the end of a tube may be used to directly examine the colon (sigmoidoscopy or colonoscopy) to detect the earliest forms of colorectal cancer. Talk to your health care provider about this at age 24 when routine screening begins. A direct exam of the colon should be repeated every 5-10 years through age 24, unless early forms of precancerous polyps or small growths are found.  People who are at an increased risk for hepatitis B should be screened for this virus. You are considered at high risk for hepatitis B if:  You were born in a country where hepatitis B occurs often. Talk with your health care provider about which countries are considered high risk.  Your parents were born in a high-risk country and you have not received a shot to protect against hepatitis B (hepatitis B vaccine).  You have HIV or AIDS.  You use needles to inject street drugs.  You live with, or have sex with, someone who has hepatitis B.  You are a man who has sex with other men (MSM).  You get hemodialysis treatment.  You take certain medicines for conditions like cancer, organ transplantation, and autoimmune conditions.  Hepatitis C blood testing is recommended for all people born from [id number] through 1964 and any individual with known risk factors for hepatitis C.  Healthy men should no longer receive prostate-specific antigen (PSA) blood tests as part of routine cancer screening. Talk to your health care provider about prostate cancer screening.  Testicular cancer screening is not recommended for  adolescents or adult males who have no symptoms. Screening includes self-exam, a health care provider exam, and other screening tests. Consult with your health care provider about any symptoms you have or any concerns you have about testicular cancer.  Practice safe sex. Use condoms and avoid high-risk sexual practices to reduce the spread of sexually transmitted infections (STIs).  You should be screened for STIs, including gonorrhea and  chlamydia if:  You are sexually active and are younger than 24 years.  You are older than 24 years, and your health care provider tells you that you are at risk for this type of infection.  Your sexual activity has changed since you were last screened, and you are at an increased risk for chlamydia or gonorrhea. Ask your health care provider if you are at risk.  If you are at risk of being infected with HIV, it is recommended that you take a prescription medicine daily to prevent HIV infection. This is called pre-exposure prophylaxis (PrEP). You are considered at risk if:  You are a man who has sex with other men (MSM).  You are a heterosexual man who is sexually active with multiple partners.  You take drugs by injection.  You are sexually active with a partner who has HIV.  Talk with your health care provider about whether you are at high risk of being infected with HIV. If you choose to begin PrEP, you should first be tested for HIV. You should then be tested every 3 months for as long as you are taking PrEP.  Use sunscreen. Apply sunscreen liberally and repeatedly throughout the day. You should seek shade when your shadow is shorter than you. Protect yourself by wearing long sleeves, pants, a wide-brimmed hat, and sunglasses year round whenever you are outdoors.  Tell your health care provider of new moles or changes in moles, especially if there is a change in shape or color. Also, tell your health care provider if a mole is larger than the size of a  pencil eraser.  A one-time screening for abdominal aortic aneurysm (AAA) and surgical repair of large AAAs by ultrasound is recommended for men aged 65-75 years who are current or former smokers.  Stay current with your vaccines (immunizations). This information is not intended to replace advice given to you by your health care provider. Make sure you discuss any questions you have with your health care provider. Document Released: 03/03/2008 Document Revised: 09/26/2014 Document Reviewed: 06/09/2015 Elsevier Interactive Patient Education  2017 Elsevier Inc.    -  RICE for Routine Care of Injuries Introduction Many injuries can be cared for using rest, ice, compression, and elevation (RICE therapy). Using RICE therapy can help to lessen pain and swelling. It can help your body to heal. Rest  Reduce your normal activities and avoid using the injured part of your body. You can go back to your normal activities when you feel okay and your doctor says it is okay. Ice  Do not put ice on your bare skin.  Put ice in a plastic bag.  Place a towel between your skin and the bag.  Leave the ice on for 20 minutes, 2-3 times a day. Do this for as long as told by your doctor. Compression  Compression means putting pressure on the injured area. This can be done with an elastic bandage. If an elastic bandage has been applied:  Remove and reapply the bandage every 3-4 hours or as told by your doctor.  Make sure the bandage is not wrapped too tight. Wrap the bandage more loosely if part of your body beyond the bandage is blue, swollen, cold, painful, or loses feeling (numb).  See your doctor if the bandage seems to make your problems worse. Elevation  Elevation means keeping the injured area raised. Raise the injured area above your heart or the center of your chest if you can. When should I  get help? You should get help if:  You keep having pain and swelling.  Your symptoms get worse. Get  help right away if: You should get help right away if:  You have sudden bad pain at or below the area of your injury.  You have redness or more swelling around your injury.  You have tingling or numbness at or below the injury that does not go away when you take off the bandage. This information is not intended to replace advice given to you by your health care provider. Make sure you discuss any questions you have with your health care provider. Document Released: 02/22/2008 Document Revised: 02/11/2016 Document Reviewed: 08/13/2014  2017 Elsevier  -  Back Pain, Adult Introduction Back pain is very common. The pain often gets better over time. The cause of back pain is usually not dangerous. Most people can learn to manage their back pain on their own. Follow these instructions at home: Watch your back pain for any changes. The following actions may help to lessen any pain you are feeling:  Stay active. Start with short walks on flat ground if you can. Try to walk farther each day.  Exercise regularly as told by your doctor. Exercise helps your back heal faster. It also helps avoid future injury by keeping your muscles strong and flexible.  Do not sit, drive, or stand in one place for more than 30 minutes.  Do not stay in bed. Resting more than 1-2 days can slow down your recovery.  Be careful when you bend or lift an object. Use good form when lifting:  Bend at your knees.  Keep the object close to your body.  Do not twist.  Sleep on a firm mattress. Lie on your side, and bend your knees. If you lie on your back, put a pillow under your knees.  Take medicines only as told by your doctor.  Put ice on the injured area.  Put ice in a plastic bag.  Place a towel between your skin and the bag.  Leave the ice on for 20 minutes, 2-3 times a day for the first 2-3 days. After that, you can switch between ice and heat packs.  Avoid feeling anxious or stressed. Find good ways  to deal with stress, such as exercise.  Maintain a healthy weight. Extra weight puts stress on your back. Contact a doctor if:  You have pain that does not go away with rest or medicine.  You have worsening pain that goes down into your legs or buttocks.  You have pain that does not get better in one week.  You have pain at night.  You lose weight.  You have a fever or chills. Get help right away if:  You cannot control when you poop (bowel movement) or pee (urinate).  Your arms or legs feel weak.  Your arms or legs lose feeling (numbness).  You feel sick to your stomach (nauseous) or throw up (vomit).  You have belly (abdominal) pain.  You feel like you may pass out (faint). This information is not intended to replace advice given to you by your health care provider. Make sure you discuss any questions you have with your health care provider. Document Released: 02/22/2008 Document Revised: 02/11/2016 Document Reviewed: 01/07/2014  2017 Elsevier Td Vaccine (Tetanus and Diphtheria): What You Need to Know 1. Why get vaccinated? Tetanus  and diphtheria are very serious diseases. They are rare in the [place] today, but people who do  become infected often have severe complications. Td vaccine is used to protect adolescents and adults from both of these diseases. Both tetanus and diphtheria are infections caused by bacteria. Diphtheria spreads from person to person through coughing or sneezing. Tetanus-causing bacteria enter the body through cuts, scratches, or wounds. TETANUS (lockjaw) causes painful muscle tightening and stiffness, usually all over the body.  It can lead to tightening of muscles in the head and neck so you can't open your mouth, swallow, or sometimes even breathe. Tetanus kills about 1 out of every 10 people who are infected even after receiving the best medical care. DIPHTHERIA can cause a thick coating to form in the back of the throat.  It can lead to  breathing problems, paralysis, heart failure, and death. Before vaccines, as many as 200,000 cases of diphtheria and hundreds of cases of tetanus were reported in the [place] each year. Since vaccination began, reports of cases for both diseases have dropped by about 99%. 2. Td vaccine Td vaccine can protect adolescents and adults from tetanus and diphtheria. Td is usually given as a booster dose every 10 years but it can also be given earlier after a severe and dirty wound or burn. Another vaccine, called Tdap, which protects against pertussis in addition to tetanus and diphtheria, is sometimes recommended instead of Td vaccine. Your doctor or the person giving you the vaccine can give you more information. Td may safely be given at the same time as other vaccines. 3. Some people should not get this vaccine  A person who has ever had a life-threatening allergic reaction after a previous dose of any tetanus or diphtheria containing vaccine, OR has a severe allergy to any part of this vaccine, should not get Td vaccine. Tell the person giving the vaccine about any severe allergies.  Talk to your doctor if you:  had severe pain or swelling after any vaccine containing diphtheria or tetanus,  ever had a condition called Guillain Barre Syndrome (GBS),  aren't feeling well on the day the shot is scheduled. 4. What are the risks from Td vaccine? With any medicine, including vaccines, there is a chance of side effects. These are usually mild and go away on their own. Serious reactions are also possible but are rare. Most people who get Td vaccine do not have any problems with it. Mild problems following Td vaccine: (Did not interfere with activities)  Pain where the shot was given (about 8 people in 10)  Redness or swelling where the shot was given (about 1 person in 4)  Mild fever (rare)  Headache (about 1 person in 4)  Tiredness (about 1 person in 4) Moderate problems following Td  vaccine: (Interfered with activities, but did not require medical attention)  Fever over 102F (rare) Severe problems following Td vaccine: (Unable to perform usual activities; required medical attention)  Swelling, severe pain, bleeding and/or redness in the arm where the shot was given (rare). Problems that could happen after any vaccine:  People sometimes faint after a medical procedure, including vaccination. Sitting or lying down for about 15 minutes can help prevent fainting, and injuries caused by a fall. Tell your doctor if you feel dizzy, or have vision changes or ringing in the ears.  Some people get severe pain in the shoulder and have difficulty moving the arm where a shot was given. This happens very rarely.  Any medication can cause a severe allergic reaction. Such reactions from a vaccine are very rare, estimated  at fewer than 1 in a million doses, and would happen within a few minutes to a few hours after the vaccination. As with any medicine, there is a very remote chance of a vaccine causing a serious injury or death. The safety of vaccines is always being monitored. For more information, visit: http://floyd.org/ 5. What if there is a serious reaction? What should I look for? Look for anything that concerns you, such as signs of a severe allergic reaction, very high fever, or unusual behavior. Signs of a severe allergic reaction can include hives, swelling of the face and throat, difficulty breathing, a fast heartbeat, dizziness, and weakness. These would usually start a few minutes to a few hours after the vaccination. What should I do?  If you think it is a severe allergic reaction or other emergency that can't wait, call 9-1-1 or get the person to the nearest hospital. Otherwise, call your doctor.  Afterward, the reaction should be reported to the Vaccine Adverse Event Reporting System (VAERS). Your doctor might file this report, or you can do it yourself  through the VAERS web site at www.vaers.LAgents.no, or by calling 1-867-093-3614.  VAERS does not give medical advice. 6. The National Vaccine Injury Compensation Program The [employer] Vaccine Injury Compensation Program (VICP) is a federal program that was created to compensate people who may have been injured by certain vaccines. Persons who believe they may have been injured by a vaccine can learn about the program and about filing a claim by calling 1-867-756-5187 or visiting the VICP website at SpiritualWord.at. There is a time limit to file a claim for compensation. 7. How can I learn more?  Ask your doctor. He or she can give you the vaccine package insert or suggest other sources of information.  Call your local or state health department.  Contact the Centers for Disease Control and Prevention (CDC):  Call (415) 135-0021 (1-800-CDC-INFO)  Visit CDC's website at PicCapture.uy CDC Td Vaccine VIS (12/29/15) This information is not intended to replace advice given to you by your health care provider. Make sure you discuss any questions you have with your health care provider. Document Released: 07/03/2006 Document Revised: 05/26/2016 Document Reviewed: 05/26/2016 Elsevier Interactive Patient Education  2017 Elsevier Inc. Pneumococcal Polysaccharide Vaccine: What You Need to Know 1. Why get vaccinated? Vaccination can protect older adults (and some children and younger adults) from pneumococcal disease. Pneumococcal disease is caused by bacteria that can spread from person to person through close contact. It can cause ear infections, and it can also lead to more serious infections of the:  Lungs (pneumonia),  Blood (bacteremia), and  Covering of the brain and spinal cord (meningitis). Meningitis can cause deafness and brain damage, and it can be fatal. Anyone can get pneumococcal disease, but children under 42 years of age, people with certain medical conditions,  adults over 1 years of age, and cigarette smokers are at the highest risk. About 18,000 older adults die each year from pneumococcal disease in the [place]. Treatment of pneumococcal infections with penicillin and other drugs used to be more effective. But some strains of the disease have become resistant to these drugs. This makes prevention of the disease, through vaccination, even more important. 2. Pneumococcal polysaccharide vaccine (PPSV23) Pneumococcal polysaccharide vaccine (PPSV23) protects against 23 types of pneumococcal bacteria. It will not prevent all pneumococcal disease. PPSV23 is recommended for:  All adults 5 years of age and older,  Anyone 2 through 24 years of age with certain long-term health  problems,  Anyone 2 through 24 years of age with a weakened immune system,  Adults 39 through 24 years of age who smoke cigarettes or have asthma. Most people need only one dose of PPSV. A second dose is recommended for certain high-risk groups. People 64 and older should get a dose even if they have gotten one or more doses of the vaccine before they turned 65. Your healthcare provider can give you more information about these recommendations. Most healthy adults develop protection within 2 to 3 weeks of getting the shot. 3. Some people should not get this vaccine  Anyone who has had a life-threatening allergic reaction to PPSV should not get another dose.  Anyone who has a severe allergy to any component of PPSV should not receive it. Tell your provider if you have any severe allergies.  Anyone who is moderately or severely ill when the shot is scheduled may be asked to wait until they recover before getting the vaccine. Someone with a mild illness can usually be vaccinated.  Children less than 47 years of age should not receive this vaccine.  There is no evidence that PPSV is harmful to either a pregnant woman or to her fetus. However, as a precaution, women who need the  vaccine should be vaccinated before becoming pregnant, if possible. 4. Risks of a vaccine reaction With any medicine, including vaccines, there is a chance of side effects. These are usually mild and go away on their own, but serious reactions are also possible. About half of people who get PPSV have mild side effects, such as redness or pain where the shot is given, which go away within about two days. Less than 1 out of 100 people develop a fever, muscle aches, or more severe local reactions. Problems that could happen after any vaccine:  People sometimes faint after a medical procedure, including vaccination. Sitting or lying down for about 15 minutes can help prevent fainting, and injuries caused by a fall. Tell your doctor if you feel dizzy, or have vision changes or ringing in the ears.  Some people get severe pain in the shoulder and have difficulty moving the arm where a shot was given. This happens very rarely.  Any medication can cause a severe allergic reaction. Such reactions from a vaccine are very rare, estimated at about 1 in a million doses, and would happen within a few minutes to a few hours after the vaccination. As with any medicine, there is a very remote chance of a vaccine causing a serious injury or death. The safety of vaccines is always being monitored. For more information, visit: http://floyd.org/ 5. What if there is a serious reaction? What should I look for? Look for anything that concerns you, such as signs of a severe allergic reaction, very high fever, or unusual behavior. Signs of a severe allergic reaction can include hives, swelling of the face and throat, difficulty breathing, a fast heartbeat, dizziness, and weakness. These would usually start a few minutes to a few hours after the vaccination. What should I do? If you think it is a severe allergic reaction or other emergency that can't wait, call 9-1-1 or get to the nearest hospital. Otherwise, call  your doctor. Afterward, the reaction should be reported to the Vaccine Adverse Event Reporting System (VAERS). Your doctor might file this report, or you can do it yourself through the VAERS web site at www.vaers.LAgents.no, or by calling 1-(913)616-2884. VAERS does not give medical advice. 6. How can I  learn more?  Ask your doctor. He or she can give you the vaccine package insert or suggest other sources of information.  Call your local or state health department.  Contact the Centers for Disease Control and Prevention (CDC):  Call 204-013-52591-539-009-0027 (1-800-CDC-INFO) or  Visit CDC's website at PicCapture.uywww.cdc.gov/vaccines CDC Pneumococcal Polysaccharide Vaccine VIS (01/10/14) This information is not intended to replace advice given to you by your health care provider. Make sure you discuss any questions you have with your health care provider. Document Released: 07/03/2006 Document Revised: 05/26/2016 Document Reviewed: 05/26/2016 Elsevier Interactive Patient Education  2017 [employer]

## 2016-08-16 NOTE — Progress Notes (Addendum)
Donald Mckay, is a 25 y.o. male  ZOX:[id number]  WJX:[id number]  DOB - 1991/06/24  CC: No chief complaint on file.      HPI: Donald Mckay is a 25 y.o. male here today to establish medical care, cleaning our clinic 05/17/2016 by Dr. Nancy MarusMal significantly paresthesias, myalgias, is currently being worked up by neurology for possible Katheran AweGilliam Barr syndrome versus B12 deficiency causing the descending paresthesias and myopathy.  There is also question of conversion disorder as well as a history of depression.  She notes that recently on the November 16, hhe pulled a muscle after playing with his mom and his dog with severe excruciating back pain that radiated up to his neck, and he went to the ED for further evaluation.  States pain persist. He denies any sensation on bilateral lower extremities.  Has f/u w/ Neuro this Friday for some further testing.  He is currently certainly an eye wheelchair requesting electric wheelchair if able. Also he's been off of work since this all started,  Which is causing boredom, and started smoking more now.   Last seizure. He said it was about 3-4 weeks ago. She describes them as silent/absence seizures. He is on Keppra for that.  He smokes a pack a day. Denies alcohol.  Patient has No headache, No chest pain, No abdominal pain - No Nausea, No new weakness tingling or numbness, No Cough - SOB.  He denies urinary and bladder incontinence.     Review of Systems: Per history of present illness, otherwise all systems reviewed, essentially unremarkable except for about   Allergies  Allergen Reactions  . Ibuprofen Other (See Comments)    Reaction:  Nose bleeds   . Fluzone [Flu Virus Vaccine] Other (See Comments)    Pt with documented diagnosis of Guillain-Barre syndrome.   Past Medical History:  Diagnosis Date  . Anxiety   . Depression 2012   History of self injury.  Reyes Ivan. Guillain Barr syndrome [facility](HCC)    Suspect not actually GBS, but rather conversion  disorder([facility])  . Seizures (HCC)    Current Outpatient Prescriptions on File Prior to Visit  Medication Sig Dispense Refill  . omeprazole (PRILOSEC) 20 MG capsule Take 1 capsule (20 mg total) by mouth daily. 30 capsule 3  . cetirizine (ZYRTEC) 10 MG tablet Take 1 tablet (10 mg total) by mouth daily. (Patient not taking: Reported on 08/16/2016) 30 tablet 3  . cyanocobalamin (,VITAMIN B-12,) 1000 MCG/ML injection Inject 1 mL (1,000 mcg total) into the muscle every 30 (thirty) days. (Patient not taking: Reported on 08/16/2016) 1 mL 3  . HYDROcodone-acetaminophen (NORCO/VICODIN) 5-325 MG tablet Take 1 tablet by mouth every 6 (six) hours as needed. (Patient not taking: Reported on 08/16/2016) 8 tablet 0  . Lurasidone HCl (LATUDA) 60 MG TABS Take 120 mg by mouth daily.     . QUEtiapine (SEROQUEL) 50 MG tablet Take 50 mg by mouth at bedtime.     . traZODone (DESYREL) 100 MG tablet Take 100 mg by mouth at bedtime.      No current facility-administered medications on file prior to visit.    Family History  Problem Relation Age of Onset  . Cancer Mother   . Stroke Maternal Grandmother    Social History   Social History  . Marital status: Single    Spouse name: N/A  . Number of children: N/A  . Years of education: N/A   Occupational History  . Not on file.  Social History Main Topics  . Smoking status: Current Every Day Smoker    Packs/day: 0.25    Types: Cigarettes  . Smokeless tobacco: Never Used  . Alcohol use No     Comment: occasional   . Drug use:     Types: Marijuana     Comment: pt smokes marijuana daily   . Sexual activity: Yes   Other Topics Concern  . Not on file   Social History Narrative  . No narrative on file    Objective:   Vitals:   08/16/16 0953  BP: 137/83  Pulse: 86  Resp: 20  Temp: 98.8 F (37.1 C)    Filed Weights   08/16/16 0947  Weight: 284 lb (128.8 kg)    BP Readings from Last 3 Encounters:  08/16/16  137/83  08/04/16 154/80  07/01/16 123/77    Physical Exam: Constitutional: Patient appears well-developed and well-nourished. No distress. AAOx3, obese. HENT: Normocephalic, atraumatic, External right and left ear normal. Oropharynx is clear and moist.  Eyes: Conjunctivae and EOM are normal. PERRL, no scleral icterus.  bilat ceruminosis. Neck: Normal ROM. Neck supple. No JVD.  CVS: RRR, S1/S2 +, no murmurs, no gallops, no carotid bruit.  Pulmonary: Effort and breath sounds normal, no stridor, rhonchi, wheezes, rales.  Abdominal: Soft. BS +, no distension, tenderness, rebound or guarding.  Musculoskeletal:  No edema.  Tenderness to palpation L4-L5 range, paraspinous region radiating up to his neck region as well. No nuchal rigidity. Negative brudzinski sign. LE: bilat/ no c/c/e, pulses 2+ bilateral.  Neuro: Alert. Reflexes 2 + bilat knees. muscle tone coordination wnl. No cranial nerve deficit grossly. Skin: Skin is warm and dry. No rash noted. Not diaphoretic. No erythema. No pallor. Psychiatric: Normal mood and affect. Behavior, judgment, thought content normal.  Lab Results  Component Value Date   WBC 10.4 08/04/2016   HGB 17.0 08/04/2016   HCT 48.6 08/04/2016   MCV 89.8 08/04/2016   PLT 277 08/04/2016   Lab Results  Component Value Date   CREATININE 0.79 08/04/2016   BUN 9 08/04/2016   NA 137 08/04/2016   K 3.8 08/04/2016   CL 106 08/04/2016   CO2 24 08/04/2016    Lab Results  Component Value Date   HGBA1C 5.20 10/09/2015   Lipid Panel     Component Value Date/Time   CHOL 167 07/07/2015 1631   TRIG 302 (H) 07/07/2015 1631   HDL 29 (L) 07/07/2015 1631   CHOLHDL 5.8 (H) 07/07/2015 1631   VLDL 60 (H) 07/07/2015 1631   LDLCALC 78 07/07/2015 1631        Depression screen PHQ 2/9 08/16/2016 08/16/2016 05/17/2016 05/17/2016 03/14/2016  Decreased Interest 3 3 3 3 3   Down, Depressed, Hopeless 2 2 3 3 2   PHQ - 2 Score 5 5 6 6 5   Altered sleeping 3 2 3  - 3  Tired,  decreased energy 3 2 3  - 3  Change in appetite 3 2 3  - 2  Feeling bad or failure about yourself  3 2 3  - 2  Trouble concentrating 3 2 3  - 3  Moving slowly or fidgety/restless 2 3 0 - 1  Suicidal thoughts 2 1 2  - 1  PHQ-9 Score 24 19 23  - 20    Assessment and plan:   1. Myopathy, with history of a sending mumps so paresthesias, numbness with questionable giving A syndrome versus conversion disorder - Currently being seen by neurology for further workup - Vitamin B12 - Lyme  Disease Abs IgG, IgM, IFA, CSF - Ambulatory referral to Physical Therapy - Ambulatory referral to Occupational Therapy  2. Paresthesia See above - Ambulatory referral to Physical Therapy - Ambulatory referral to Occupational Therapy  3.  Gonorrhea - Patient was in clinic she received a call is positive for gonorrhea. We treated with Rocephin and azithromycin for chlamydia. - Advised  patient to tell his partner to get treated as well - cefTRIAXone (ROCEPHIN) injection 250 mg; Inject 250 mg into the muscle once. - azithromycin (ZITHROMAX) tablet 1,000 mg; Take 2 tablets (1,000 mg total) by mouth once.  4. Lower back muscle strain - advised back stretches, RICE,  - Trial Mobic/diclofenac cream/Fioricet for muscle spasms as well as increasing his Neurontin to 300 mg 3 times a day when necessary    4. Vitamin D deficiency - VITAMIN D 25 Hydroxy (Vit-D Deficiency, Fractures)  5. Tobacco use disorder Total cessation recd, tips provided, Trial nicoderm patch 21mg  patch qd  6. Seizures (HCC) Renewed keppra, per neuro  7. Immunizations tdap and pneumococcal 23v today Holding off on flu vac.  8. Ceruminosis, bilat rn visit appt for irrigation.  Return in about 3 months (around 11/16/2016), or if symptoms worsen or fail to improve.  The patient was given clear instructions to go to ER or return to medical center if symptoms don't improve, worsen or new problems develop. The patient verbalized understanding.  The patient was told to call to get lab results if they haven't heard anything in the next week.    This note has been created with Education officer, environmental. Any transcriptional errors are unintentional.   Pete Glatter, MD, MBA/MHA [facility] [place], [place] [phone]   08/16/2016, 12:37 PM

## 2016-08-16 NOTE — BH Specialist Note (Signed)
Session Start time: 10:30 am   End Time: 11:00 am Total Time:  30 minutes Type of Service: Behavioral Health - Individual/Family Interpreter: No.   Interpreter Name & Language: N/A # [facility]-June [facility]   SUBJECTIVE: Donald CarnesWilliam Wayde Lenahan Montez HagemanJr. is a 25 y.o. male  Pt. was referred by Dr. Julien NordmannLangeland for:  anxiety and depression. Pt. reports the following symptoms/concerns: difficulty falling/staying asleep, low energy, withdrawn behavior, and racing thoughts. Pt has audio non-command hallucinations.  Duration of problem:  Ongoing Severity: severe Previous treatment: Pt receives medication management through [facility].    OBJECTIVE: Mood: Anxious and Dysphoric & Affect: Depressed Risk of harm to self or others: Pt denied SI/HI Assessments administered: PHQ-9; GAD-7  LIFE CONTEXT:  Family & Social: Pt resides with his mother and mother's partner. Pt has a couple of close friendships School/ Work: Pt is unemployed. He receives [employer]icaid and Food Stamps ($190) Self-Care: Pt has difficulty sleeping and reported having a poor diet. Pt smokes marijuana daily to cope with mental health diagnoses  Life changes: Pt was recently diagnosed with Bipolar, Schizophrenia, Major Depression, and Anxiety a couple of months ago by [facility] What is important to pt/family (values): Family   GOALS ADDRESSED:  Decrease symptoms of depression Decrease symptoms of anxiety  INTERVENTIONS: Solution Focused, Strength-based and Supportive   ASSESSMENT:  Pt currently experiencing depression and anxiety. Pt reports difficulty falling/staying asleep, low energy, withdrawn behavior, and racing thoughts. Pt has audio non-command hallucinations. Pt may benefit from psychotherapy and medication management. LCSWA educated pt on the cycle of depression and anxiety,  discussed how substance use can impact pt's mental and physical health, and discussed healthy coping skills to decrease symptoms. Pt is currently  receiving medication management with [facility]. Pt was provided with crisis intervention resources and was encouraged to initiate behavioral health services.      PLAN: 1. F/U with behavioral health clinician: Pt was encouraged to contact LCSWA if symptoms worsen or fail to improve to [facility]. 2. Behavioral Health meds: Latuda and Seroquel 3. Behavioral recommendations: LCSWA recommends that pt apply healthy coping skills discussed. Pt is encouraged to schedule follow up appointment with LCSWA 4. Referral: Brief Counseling/Psychotherapy, [facility], Problem-solving teaching/coping strategies, Psychoeducation and Supportive Counseling 5. From scale of 1-10, how likely are you to follow plan: 7/10   Bridgett LarssonJasmine D Lewis, MSW, LCSWA  Clinical Social Worker 08/16/16 12:59 pm  Warmhandoff:   Warm Hand Off Completed.

## 2016-08-17 ENCOUNTER — Ambulatory Visit: Payer: Medicaid Other

## 2016-08-17 ENCOUNTER — Other Ambulatory Visit: Payer: Self-pay | Admitting: Internal Medicine

## 2016-08-17 ENCOUNTER — Ambulatory Visit: Payer: Medicaid Other | Attending: Internal Medicine

## 2016-08-17 LAB — VITAMIN B12: Vitamin B-12: 515 pg/mL (ref 200–1100)

## 2016-08-17 NOTE — Progress Notes (Signed)
Patient here for lab visit only 

## 2016-08-18 LAB — VITAMIN D 25 HYDROXY (VIT D DEFICIENCY, FRACTURES): VIT D 25 HYDROXY: 16 ng/mL — AB (ref 30–100)

## 2016-08-18 LAB — LYME AB/WESTERN BLOT REFLEX: B burgdorferi Ab IgG+IgM: <0.9 Index (ref <0.90)

## 2016-08-22 ENCOUNTER — Other Ambulatory Visit: Payer: Self-pay | Admitting: Internal Medicine

## 2016-08-22 ENCOUNTER — Other Ambulatory Visit: Payer: Self-pay | Admitting: Pharmacist

## 2016-08-22 MED ORDER — VITAMIN D (ERGOCALCIFEROL) 1.25 MG (50000 UNIT) PO CAPS: 50000.0000 [IU] | ORAL_CAPSULE | ORAL | 0 refills | Status: DC

## 2016-08-22 MED ORDER — VOLTAREN 1 % TD GEL: 2.0000 g | Freq: Four times a day (QID) | TRANSDERMAL | 2 refills | Status: DC

## 2016-08-22 MED ORDER — CYANOCOBALAMIN 1000 MCG/ML IJ SOLN: 1000.0000 ug | INTRAMUSCULAR | 3 refills | Status: DC

## 2016-08-25 ENCOUNTER — Ambulatory Visit: Payer: Self-pay

## 2016-09-01 ENCOUNTER — Telehealth: Payer: Self-pay | Admitting: Internal Medicine

## 2016-09-01 NOTE — Telephone Encounter (Signed)
Patients mom dropped of paperwork to be signed by the doctor.

## 2016-09-01 NOTE — Telephone Encounter (Signed)
Will forward to pcp

## 2016-09-05 ENCOUNTER — Ambulatory Visit: Payer: Self-pay

## 2016-09-06 ENCOUNTER — Ambulatory Visit: Payer: Medicaid Other | Attending: Internal Medicine | Admitting: *Deleted

## 2016-09-06 ENCOUNTER — Ambulatory Visit: Payer: Medicaid Other | Attending: Internal Medicine | Admitting: Rehabilitative and Restorative Service Providers"

## 2016-09-06 DIAGNOSIS — G729 Myopathy, unspecified: Secondary | ICD-10-CM

## 2016-09-06 DIAGNOSIS — E538 Deficiency of other specified B group vitamins: Secondary | ICD-10-CM | POA: Insufficient documentation

## 2016-09-06 MED ORDER — CYANOCOBALAMIN 1000 MCG/ML IJ SOLN
1000.0000 ug | Freq: Once | INTRAMUSCULAR | Status: AC
  Administered 2016-09-06: 1000 ug via INTRAMUSCULAR

## 2016-09-06 NOTE — Telephone Encounter (Signed)
I already faxed it over but patient can pick it up

## 2016-09-06 NOTE — Telephone Encounter (Signed)
Pt. Came into facility to check on the status of his paperwork. Pt. States he needs it by the end of the week.  Please f/u

## 2016-09-22 ENCOUNTER — Ambulatory Visit: Payer: Self-pay

## 2016-09-28 ENCOUNTER — Telehealth: Payer: Self-pay | Admitting: Internal Medicine

## 2016-09-28 DIAGNOSIS — E559 Vitamin D deficiency, unspecified: Secondary | ICD-10-CM

## 2016-09-28 NOTE — Telephone Encounter (Signed)
Patient is requesting medication refill for cyclobenzaprine (FLEXERIL) 10 MG tablet and Vitamin D, Ergocalciferol, (DRISDOL) 50000 units CAPS capsule. Please send it to our pharmacy.   Thank you.

## 2016-09-30 MED ORDER — CYCLOBENZAPRINE HCL 5 MG PO TABS: 10.0000 mg | ORAL_TABLET | Freq: Three times a day (TID) | ORAL | 0 refills | Status: DC | PRN

## 2016-09-30 NOTE — Telephone Encounter (Signed)
I renewed flexeril, reduced dose to 5mg  tid prn for muscle spasms.  Lab ordered for rechk vit d levels, may not need heavy rx and just otc. Thanks.   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  He has yet to have somatosensory evoked potential testing or establish w/ PT or OT.  Needs to have therapy and rehab in potential recovery.  Neuro set yp up w/ reordered somatosensory evoked potential testing, reordered pt/ot.  Agreed w/ vit b12 supplementation for now.  Continue psychiatry recd as well. Fu 6months.

## 2016-10-04 ENCOUNTER — Ambulatory Visit: Payer: Medicaid Other | Attending: Internal Medicine | Admitting: *Deleted

## 2016-10-04 ENCOUNTER — Ambulatory Visit: Payer: Medicaid Other

## 2016-10-04 DIAGNOSIS — E559 Vitamin D deficiency, unspecified: Secondary | ICD-10-CM | POA: Diagnosis not present

## 2016-10-04 DIAGNOSIS — E538 Deficiency of other specified B group vitamins: Secondary | ICD-10-CM | POA: Diagnosis not present

## 2016-10-04 MED ORDER — CYANOCOBALAMIN 1000 MCG/ML IJ SOLN
1000.0000 ug | Freq: Once | INTRAMUSCULAR | Status: AC
  Administered 2016-10-04: 1000 ug via INTRAMUSCULAR

## 2016-10-04 NOTE — Progress Notes (Signed)
Patient here for lab visit only 

## 2016-10-04 NOTE — Progress Notes (Signed)
Pt arrived for B-12 injection. Tolerated injection well.

## 2016-10-05 LAB — VITAMIN D 25 HYDROXY (VIT D DEFICIENCY, FRACTURES): VIT D 25 HYDROXY: 16 ng/mL — AB (ref 30–100)

## 2016-10-06 ENCOUNTER — Other Ambulatory Visit: Payer: Self-pay | Admitting: Internal Medicine

## 2016-10-21 ENCOUNTER — Telehealth: Payer: Self-pay | Admitting: Internal Medicine

## 2016-10-21 NOTE — Telephone Encounter (Signed)
Patient is requesting refills on Mobic and Flexeril

## 2016-10-24 ENCOUNTER — Telehealth: Payer: Self-pay | Admitting: Internal Medicine

## 2016-10-24 MED ORDER — MELOXICAM 15 MG PO TABS: 15.0000 mg | ORAL_TABLET | Freq: Two times a day (BID) | ORAL | 0 refills | Status: DC

## 2016-10-24 MED ORDER — CYCLOBENZAPRINE HCL 5 MG PO TABS: 10.0000 mg | ORAL_TABLET | Freq: Three times a day (TID) | ORAL | 0 refills | Status: DC | PRN

## 2016-10-24 MED ORDER — VITAMIN D (ERGOCALCIFEROL) 1.25 MG (50000 UNIT) PO CAPS: 50000.0000 [IU] | ORAL_CAPSULE | ORAL | 0 refills | Status: DC

## 2016-10-24 NOTE — Telephone Encounter (Signed)
Please call pt and tell him meds were renewed. thanks

## 2016-10-24 NOTE — Telephone Encounter (Signed)
Patient called the office to request medication refill for cyclobenzaprine (FLEXERIL) 5 MG tablet, Vitamin D, Ergocalciferol, (DRISDOL) 50000 units CAPS capsule and cyclobenzaprine (FLEXERIL) 5 MG tablet.   Thank you.

## 2016-10-28 ENCOUNTER — Ambulatory Visit: Payer: Medicaid Other | Attending: Internal Medicine | Admitting: Physician Assistant

## 2016-10-28 VITALS — BP 142/94 | HR 84 | Temp 98.5°F | Resp 16

## 2016-10-28 DIAGNOSIS — F419 Anxiety disorder, unspecified: Secondary | ICD-10-CM | POA: Insufficient documentation

## 2016-10-28 DIAGNOSIS — R51 Headache: Secondary | ICD-10-CM | POA: Insufficient documentation

## 2016-10-28 DIAGNOSIS — R030 Elevated blood-pressure reading, without diagnosis of hypertension: Secondary | ICD-10-CM | POA: Diagnosis not present

## 2016-10-28 DIAGNOSIS — K2990 Gastroduodenitis, unspecified, without bleeding: Secondary | ICD-10-CM

## 2016-10-28 DIAGNOSIS — K2970 Gastritis, unspecified, without bleeding: Secondary | ICD-10-CM

## 2016-10-28 DIAGNOSIS — H6690 Otitis media, unspecified, unspecified ear: Secondary | ICD-10-CM

## 2016-10-28 DIAGNOSIS — G610 Guillain-Barre syndrome: Secondary | ICD-10-CM | POA: Insufficient documentation

## 2016-10-28 DIAGNOSIS — F329 Major depressive disorder, single episode, unspecified: Secondary | ICD-10-CM | POA: Insufficient documentation

## 2016-10-28 DIAGNOSIS — K219 Gastro-esophageal reflux disease without esophagitis: Secondary | ICD-10-CM | POA: Insufficient documentation

## 2016-10-28 DIAGNOSIS — R569 Unspecified convulsions: Secondary | ICD-10-CM | POA: Insufficient documentation

## 2016-10-28 MED ORDER — AMOXICILLIN 500 MG PO CAPS: 500.0000 mg | ORAL_CAPSULE | Freq: Three times a day (TID) | ORAL | 0 refills | Status: DC

## 2016-10-28 MED ORDER — PANTOPRAZOLE SODIUM 40 MG PO TBEC: 40.0000 mg | DELAYED_RELEASE_TABLET | Freq: Every day | ORAL | 3 refills | Status: DC

## 2016-10-28 NOTE — Progress Notes (Signed)
Donald Mckay, is a 26 y.o. male  ZOX:[id number]  [id number]:[id number]  DOB - 04-30-1991  Subjective:  Chief Complaint and HPI: Donald Mckay is a 26 y.o. male here today for several issues.  Recent Cerumen irrigation which initially improved his hearing.  A few days after the irrigation, she stopped being able to hear out of his R ear.  Some discomfort.  He says his omeprazole is not working for heartburn and reflux.  Says it has never really worked at all since he started taking it.  He hasn't tried OTC or other meds.   Episodes of reflux/heartburn last 20 mins to several hours.  He only eats one meal a day at night.    Also, he has been having frequent headaches.  The headaches are "pounding" in nature and in a frontal distribution.  No vision changes or neurological deficits.  He says his BP has been up a lot but he hasn't been checking it. (?)   ROS:   Constitutional:  No f/c, No night sweats, No unexplained weight loss. EENT:  No vision changes, No blurry vision, + hearing changes R ear. No mouth or throat Respiratory: No cough, No SOB Cardiac: No CP, no palpitations GI:  No abd pain, No N/V/D.  +reflux/heartburn GU: No Urinary s/sx Musculoskeletal: No joint pain Neuro: + headache, no dizziness, no motor weakness.  Skin: No rash Endocrine:  No polydipsia. No polyuria.  Psych: Denies SI/HI  No problems updated.  ALLERGIES: Allergies  Allergen Reactions  . Ibuprofen Other (See Comments)    Reaction:  Nose bleeds   . Fluzone [Flu Virus Vaccine] Other (See Comments)    Pt with documented diagnosis of Guillain-Barre syndrome.    PAST MEDICAL HISTORY: Past Medical History:  Diagnosis Date  . Anxiety   . Depression 2012   History of self injury.  Reyes Ivan. Guillain Barr syndrome [facility](HCC)    Suspect not actually GBS, but rather conversion disorder([facility])  . Seizures (HCC)     MEDICATIONS AT HOME: Prior to Admission medications   Medication Sig  Start Date End Date Taking? Authorizing Provider  amoxicillin (AMOXIL) 500 MG capsule Take 1 capsule (500 mg total) by mouth 3 (three) times daily. 10/28/16   Anders SimmondsAngela M Jevonte Clanton, PA-C  cetirizine (ZYRTEC) 10 MG tablet Take 1 tablet (10 mg total) by mouth daily. Patient not taking: Reported on 08/16/2016 07/01/16   Riki SheerMichelle G Young, PA-C  cyanocobalamin (,VITAMIN B-12,) 1000 MCG/ML injection Inject 1 mL (1,000 mcg total) into the muscle every 30 (thirty) days. 08/22/16   Pete Glatterawn T Langeland, MD  cyclobenzaprine (FLEXERIL) 5 MG tablet Take 2 tablets (10 mg total) by mouth 3 (three) times daily as needed for muscle spasms. 10/24/16   Pete Glatterawn T Langeland, MD  gabapentin (NEURONTIN) 300 MG capsule Take 1 capsule (300 mg total) by mouth 3 (three) times daily. 08/16/16   Pete Glatterawn T Langeland, MD  HYDROcodone-acetaminophen (NORCO/VICODIN) 5-325 MG tablet Take 1 tablet by mouth every 6 (six) hours as needed. Patient not taking: Reported on 08/16/2016 08/04/16   [facility] HospitalJaime Pilcher Ward, PA-C  levETIRAcetam (KEPPRA) 500 MG tablet Take 1 tablet (500 mg total) by mouth 2 (two) times daily. 08/16/16   Pete Glatterawn T Langeland, MD  Lurasidone HCl (LATUDA) 60 MG TABS Take 120 mg by mouth daily.     Historical Provider, MD  meloxicam (MOBIC) 15 MG tablet Take 1 tablet (15 mg total) by mouth 2 (two) times daily. Take with food 10/24/16   Dawn  Marland Mcalpine, MD  nicotine (NICODERM CQ) 21 mg/24hr patch Place 1 patch (21 mg total) onto the skin daily. Patient not taking: Reported on 10/28/2016 08/16/16   Pete Glatter, MD  pantoprazole (PROTONIX) 40 MG tablet Take 1 tablet (40 mg total) by mouth daily. 10/28/16   Anders Simmonds, PA-C  QUEtiapine (SEROQUEL) 50 MG tablet Take 50 mg by mouth at bedtime.     Historical Provider, MD  traZODone (DESYREL) 100 MG tablet Take 100 mg by mouth at bedtime.     Historical Provider, MD  Vitamin D, Ergocalciferol, (DRISDOL) 50000 units CAPS capsule Take 1 capsule (50,000 Units total) by mouth every 7 (seven)  days. Patient not taking: Reported on 10/28/2016 10/24/16   Pete Glatter, MD  VOLTAREN 1 % GEL Apply 2 g topically 4 (four) times daily. 08/22/16   Pete Glatter, MD     Objective:  EXAM:   Vitals:   10/28/16 1543  BP: (!) 142/94  Pulse: 84  Resp: 16  Temp: 98.5 F (36.9 C)  TempSrc: Oral  SpO2: 98%    General appearance : A&OX3. NAD. Non-toxic-appearing.  In a wheel chair HEENT: Atraumatic and Normocephalic.  PERRLA. EOM intact.  TM clear on L WNL, partially obstructed by cerumen.  R canal with scabbing and a small amount of fresh blood in canal.  TM is dull and erythematous but otherwise unremarkable Mouth-MMM, post pharynx WNL w/o erythema, No PND. Neck: supple, no JVD. No cervical lymphadenopathy. No thyromegaly Chest/Lungs:  Breathing-non-labored, Good air entry bilaterally, breath sounds normal without rales, rhonchi, or wheezing  CVS: S1 S2 regular, no murmurs, gallops, rubs  Extremities: Bilateral Lower Ext shows no edema, both legs are warm to touch with = pulse throughout Neurology:  CN II-XII grossly intact, Non focal.   Psych:  TP linear. J/I WNL. Normal speech. Appropriate eye contact and affect.  Skin:  No Rash  Data Review Lab Results  Component Value Date   HGBA1C 5.20 10/09/2015   HGBA1C 4.50 07/07/2015     Assessment & Plan   1. Gastritis and gastroduodenitis Stop omeprazole since ineffective.   - pantoprazole (PROTONIX) 40 MG tablet; Take 1 tablet (40 mg total) by mouth daily.  Dispense: 30 tablet; Refill: 3  2. Acute otitis media, unspecified otitis media type With qtip injury in canal.  He may have hit his TM causing barotrauma and erythema.  Will cover for AOM and refer if this doesn't improve.  Don't use QTips!! - amoxicillin (AMOXIL) 500 MG capsule; Take 1 capsule (500 mg total) by mouth 3 (three) times daily.  Dispense: 30 capsule; Refill: 0  3. Elevated BP without diagnosis of hypertension With recent increase in headaches BP on chart  review alternate bt mildly elevated and normotensive Check Blood pressure 3 times/week  And record and bring to your next visit.  Patient have been counseled extensively about nutrition and exercise  Return in about 2 weeks (around 11/11/2016) for Dr Julien Nordmann for AOM with decreased hearing, elevated BP/HA.  The patient was given clear instructions to go to ER or return to medical center if symptoms don't improve, worsen or new problems develop. The patient verbalized understanding. The patient was told to call to get lab results if they haven't heard anything in the next week.   Georgian Co, PA-C [facility] [place], [place] [phone]   10/28/2016, 4:27 PMPatient ID: Tawni Carnes [name]., male   DOB: May 24, 1991, 26 y.o.   MRN:  [id number]  

## 2016-10-28 NOTE — Patient Instructions (Signed)
Check Blood pressure 3 times/week  And record and bring to your next visit.

## 2016-11-01 ENCOUNTER — Ambulatory Visit: Payer: Self-pay

## 2016-11-03 ENCOUNTER — Telehealth: Payer: Self-pay | Admitting: Internal Medicine

## 2016-11-03 NOTE — Telephone Encounter (Signed)
Pt has medicaid do we write rx for bp monitors

## 2016-11-03 NOTE — Telephone Encounter (Signed)
Yes

## 2016-11-03 NOTE — Telephone Encounter (Signed)
Pt calling requesting a bp monitor States he is having to check his bp three times a week

## 2016-11-04 ENCOUNTER — Other Ambulatory Visit: Payer: Self-pay

## 2016-11-04 MED ORDER — BLOOD PRESSURE KIT: PACK | 0 refills | Status: AC

## 2016-11-04 NOTE — Telephone Encounter (Signed)
Sent blood pressure kit to [employer] [facility]

## 2016-11-04 NOTE — Progress Notes (Unsigned)
error 

## 2016-11-14 ENCOUNTER — Ambulatory Visit: Payer: Medicaid Other | Attending: Internal Medicine | Admitting: Internal Medicine

## 2016-11-14 ENCOUNTER — Encounter: Payer: Self-pay | Admitting: Internal Medicine

## 2016-11-14 ENCOUNTER — Ambulatory Visit (HOSPITAL_BASED_OUTPATIENT_CLINIC_OR_DEPARTMENT_OTHER): Payer: Medicaid Other | Admitting: *Deleted

## 2016-11-14 VITALS — BP 136/75 | HR 95 | Temp 98.8°F | Resp 16 | Wt 333.0 lb

## 2016-11-14 DIAGNOSIS — Z029 Encounter for administrative examinations, unspecified: Secondary | ICD-10-CM | POA: Diagnosis not present

## 2016-11-14 DIAGNOSIS — E538 Deficiency of other specified B group vitamins: Secondary | ICD-10-CM

## 2016-11-14 DIAGNOSIS — F319 Bipolar disorder, unspecified: Secondary | ICD-10-CM | POA: Insufficient documentation

## 2016-11-14 MED ORDER — CYANOCOBALAMIN 1000 MCG/ML IJ SOLN
1000.0000 ug | Freq: Once | INTRAMUSCULAR | Status: AC
  Administered 2016-11-14: 1000 ug via INTRAMUSCULAR

## 2016-11-22 ENCOUNTER — Telehealth: Payer: Self-pay | Admitting: Internal Medicine

## 2016-11-22 MED ORDER — CYCLOBENZAPRINE HCL 5 MG PO TABS: 10.0000 mg | ORAL_TABLET | Freq: Three times a day (TID) | ORAL | 0 refills | Status: DC | PRN

## 2016-11-22 NOTE — Telephone Encounter (Signed)
Patient called the office to check on status if his in home care paperwork. Patient stated that a fax was sent to us by PCA. Please follow up.   Thank you.

## 2016-11-22 NOTE — Telephone Encounter (Signed)
Patient called the office to request medication refill for cyclobenzaprine (FLEXERIL) 5 MG tablet.   Thank you.

## 2016-11-22 NOTE — Telephone Encounter (Signed)
Cyclobenzaprine refilled x 30 days

## 2016-11-23 NOTE — Telephone Encounter (Signed)
Returned pt call and we didn't receive any paperwork pt states he will call home care and see if they can refax it

## 2016-11-24 ENCOUNTER — Telehealth: Payer: Self-pay | Admitting: Internal Medicine

## 2016-11-24 NOTE — Telephone Encounter (Signed)
Patient calling to verify PCP received fax from [facility] for patient to receive home care  States paperwork was faxed yesterday, 3/7

## 2016-11-25 ENCOUNTER — Telehealth: Payer: Self-pay | Admitting: Internal Medicine

## 2016-11-25 ENCOUNTER — Other Ambulatory Visit: Payer: Self-pay | Admitting: Internal Medicine

## 2016-11-25 DIAGNOSIS — K2970 Gastritis, unspecified, without bleeding: Secondary | ICD-10-CM

## 2016-11-25 DIAGNOSIS — K2990 Gastroduodenitis, unspecified, without bleeding: Principal | ICD-10-CM

## 2016-11-25 MED ORDER — PANTOPRAZOLE SODIUM 40 MG PO TBEC: 40.0000 mg | DELAYED_RELEASE_TABLET | Freq: Every day | ORAL | 3 refills | Status: DC

## 2016-11-25 NOTE — Telephone Encounter (Signed)
[facility]

## 2016-11-25 NOTE — Telephone Encounter (Signed)
Patient is requesting Protonix to be refilled [facility]..Marland Kitchen

## 2016-11-28 ENCOUNTER — Ambulatory Visit: Payer: Self-pay | Admitting: Internal Medicine

## 2016-11-29 NOTE — Telephone Encounter (Signed)
Pt callin g in to inquire as to wether paperwork from [facility] has been received and faxed back. Please f/u with pt.

## 2016-11-30 NOTE — Telephone Encounter (Signed)
Paperwork will be faxed over today 3/14

## 2016-11-30 NOTE — Telephone Encounter (Signed)
I did this ppwk yesterday and it is being processed by the cma. Thanks.

## 2016-12-06 ENCOUNTER — Ambulatory Visit: Payer: Medicaid Other | Attending: Internal Medicine | Admitting: Internal Medicine

## 2016-12-06 ENCOUNTER — Other Ambulatory Visit: Payer: Self-pay | Admitting: Internal Medicine

## 2016-12-06 ENCOUNTER — Encounter: Payer: Self-pay | Admitting: Internal Medicine

## 2016-12-06 VITALS — BP 151/82 | HR 91 | Temp 98.6°F | Resp 16

## 2016-12-06 DIAGNOSIS — R531 Weakness: Secondary | ICD-10-CM | POA: Diagnosis not present

## 2016-12-06 DIAGNOSIS — F1290 Cannabis use, unspecified, uncomplicated: Secondary | ICD-10-CM | POA: Diagnosis not present

## 2016-12-06 DIAGNOSIS — I10 Essential (primary) hypertension: Secondary | ICD-10-CM | POA: Diagnosis not present

## 2016-12-06 DIAGNOSIS — F329 Major depressive disorder, single episode, unspecified: Secondary | ICD-10-CM | POA: Insufficient documentation

## 2016-12-06 DIAGNOSIS — R7989 Other specified abnormal findings of blood chemistry: Secondary | ICD-10-CM

## 2016-12-06 DIAGNOSIS — G610 Guillain-Barre syndrome: Secondary | ICD-10-CM | POA: Diagnosis not present

## 2016-12-06 DIAGNOSIS — E559 Vitamin D deficiency, unspecified: Secondary | ICD-10-CM

## 2016-12-06 DIAGNOSIS — F419 Anxiety disorder, unspecified: Secondary | ICD-10-CM | POA: Diagnosis not present

## 2016-12-06 DIAGNOSIS — F449 Dissociative and conversion disorder, unspecified: Secondary | ICD-10-CM

## 2016-12-06 DIAGNOSIS — R946 Abnormal results of thyroid function studies: Secondary | ICD-10-CM | POA: Diagnosis not present

## 2016-12-06 DIAGNOSIS — E538 Deficiency of other specified B group vitamins: Secondary | ICD-10-CM

## 2016-12-06 DIAGNOSIS — Z720 Tobacco use: Secondary | ICD-10-CM | POA: Diagnosis not present

## 2016-12-06 DIAGNOSIS — G729 Myopathy, unspecified: Secondary | ICD-10-CM | POA: Diagnosis not present

## 2016-12-06 LAB — TSH: TSH: 3.72 mIU/L (ref 0.40–4.50)

## 2016-12-06 LAB — LIPID PANEL
CHOL/HDL RATIO: 6 ratio — AB (ref <5.0)
Cholesterol: 161 mg/dL (ref <200)
HDL: 27 mg/dL — AB (ref >40)
LDL Cholesterol: 70 mg/dL (ref <100)
TRIGLYCERIDES: 318 mg/dL — AB (ref <150)
VLDL: 64 mg/dL — ABNORMAL HIGH (ref <30)

## 2016-12-06 LAB — CBC WITH DIFFERENTIAL/PLATELET
Basophils Absolute: 69 cells/uL (ref 0–200)
Basophils Relative: 1 %
Eosinophils Absolute: 414 cells/uL (ref 15–500)
Eosinophils Relative: 6 %
HCT: 49.7 % (ref 38.5–50.0)
Hemoglobin: 17.3 g/dL — ABNORMAL HIGH (ref 13.2–17.1)
Lymphocytes Relative: 40 %
Lymphs Abs: 2760 cells/uL (ref 850–3900)
MCH: 30.9 pg (ref 27.0–33.0)
MCHC: 34.8 g/dL (ref 32.0–36.0)
MCV: 88.9 fL (ref 80.0–100.0)
MPV: 9.9 fL (ref 7.5–12.5)
Monocytes Absolute: 483 cells/uL (ref 200–950)
Monocytes Relative: 7 %
Neutro Abs: 3174 cells/uL (ref 1500–7800)
Neutrophils Relative %: 46 %
Platelets: 279 10*3/uL (ref 140–400)
RBC: 5.59 MIL/uL (ref 4.20–5.80)
RDW: 13.4 % (ref 11.0–15.0)
WBC: 6.9 10*3/uL (ref 3.8–10.8)

## 2016-12-06 LAB — BASIC METABOLIC PANEL WITH GFR
BUN: 10 mg/dL (ref 7–25)
CO2: 20 mmol/L (ref 20–31)
Calcium: 9.7 mg/dL (ref 8.6–10.3)
Chloride: 106 mmol/L (ref 98–110)
Creat: 0.92 mg/dL (ref 0.60–1.35)
GFR, Est Non African American: >89 mL/min (ref >=60)
GLUCOSE: 90 mg/dL (ref 65–99)
POTASSIUM: 4.3 mmol/L (ref 3.5–5.3)
Sodium: 138 mmol/L (ref 135–146)

## 2016-12-06 MED ORDER — HYDROCHLOROTHIAZIDE 25 MG PO TABS: 25.0000 mg | ORAL_TABLET | Freq: Every day | ORAL | 3 refills | Status: DC

## 2016-12-06 MED ORDER — LEVETIRACETAM 500 MG PO TABS: 500.0000 mg | ORAL_TABLET | Freq: Two times a day (BID) | ORAL | 3 refills | Status: DC

## 2016-12-06 MED ORDER — VITAMIN D3 125 MCG (5000 UT) PO CAPS: 5000.0000 [IU] | ORAL_CAPSULE | Freq: Every day | ORAL | 3 refills | Status: DC

## 2016-12-06 MED ORDER — CYCLOBENZAPRINE HCL 5 MG PO TABS: 5.0000 mg | ORAL_TABLET | Freq: Three times a day (TID) | ORAL | 3 refills | Status: DC | PRN

## 2016-12-06 MED ORDER — GABAPENTIN 300 MG PO CAPS: 300.0000 mg | ORAL_CAPSULE | Freq: Three times a day (TID) | ORAL | 3 refills | Status: DC

## 2016-12-06 MED FILL — CYCLOBENZAPRINE 5 MG TABLET: 5 | 5 days supply | Qty: 30 | Fill #0

## 2016-12-06 MED FILL — ?PANTOPRAZOLE SOD DR 40MG: 40 MG | 30 days supply | Qty: 30 | Fill #0

## 2016-12-06 NOTE — Progress Notes (Signed)
Donald Mckay, is a 26 y.o. male  [id number]  [id number]  DOB - [birth year]  Chief Complaint  Patient presents with  . Follow-up        Subjective:   Donald Mckay is a 26 y.o. male here today for a follow up visit, last seen 08/16/16 by me, w/ hx of Guillian barre syndrome w/ residual myopathy, Conversion disorder, tob abuse.  He was recent seen by PA on 10/28/16 dx w/ gerd, right otitis media trx w/ amoxil. He did not finish the entire course of abx, states he stopped it when his hearing started coming back.  Of note, pt is getting hhc currently, but he does not know when the PT/OT will start. He is still sitting in wheelchair now, but able to get up and slowly ambulate w/o assist. However, he still c/o of left le weakness, and feels "unsteady." he denies walking w/ cane/walker, states he just "holds onto the walls" when he is at home.  He states he is still seeing Neuro - [facility] - next appt in June. He was seeing [facility], but has not been back lately. He was getting his trazodone/seroquel via [facility].   He has been getting b12 shots still in in our clinic, although b12 levels 08/16/16 wnl.  Still smoking marijuana. tob + 1pack /wk.  Patient has No headache, No chest pain, No abdominal pain - No Nausea, No new weakness tingling or numbness, No Cough - SOB.   He is here w/ his friend, who is helping him out.  No problems updated.  ALLERGIES: Allergies  Allergen Reactions  . Ibuprofen Other (See Comments)    Reaction:  Nose bleeds   . Fluzone [Flu Virus Vaccine] Other (See Comments)    Pt with documented diagnosis of Guillain-Barre syndrome.    PAST MEDICAL HISTORY: Past Medical History:  Diagnosis Date  . Anxiety   . Depression 2012   History of self injury.  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 Seizures ([place])     MEDICATIONS AT HOME: Prior to Admission  medications   Medication Sig Start Date End Date Taking? Authorizing Provider  amoxicillin (AMOXIL) 500 MG capsule Take 1 capsule (500 mg total) by mouth 3 (three) times daily. Patient not taking: Reported on 11/14/2016 10/28/16   Argentina Donovan, PA-C  Blood Pressure KIT Check blood pressure as directed 11/04/16   Tresa Garter, MD  cetirizine (ZYRTEC) 10 MG tablet Take 1 tablet (10 mg total) by mouth daily. Patient not taking: Reported on 08/16/2016 07/01/16   Bjorn Pippin, PA-C  Cholecalciferol (VITAMIN D3) 5000 units CAPS Take 1 capsule (5,000 Units total) by mouth daily. 12/06/16   Maren Reamer, MD  cyanocobalamin (,VITAMIN B-12,) 1000 MCG/ML injection Inject 1 mL (1,000 mcg total) into the muscle every 30 (thirty) days. 08/22/16   Maren Reamer, MD  cyclobenzaprine (FLEXERIL) 5 MG tablet Take 1 tablet (5 mg total) by mouth 3 (three) times daily as needed for muscle spasms. 12/06/16   Maren Reamer, MD  gabapentin (NEURONTIN) 300 MG capsule Take 1 capsule (300 mg total) by mouth 3 (three) times daily. 12/06/16   Maren Reamer, MD  hydrochlorothiazide (HYDRODIURIL) 25 MG tablet Take 1 tablet (25 mg total) by mouth daily. 12/06/16   Maren Reamer, MD  HYDROcodone-acetaminophen (NORCO/VICODIN) 5-325 MG tablet Take 1 tablet by mouth every 6 (six) hours as needed. Patient  not taking: Reported on 08/16/2016 08/04/16   [facility], PA-C  levETIRAcetam (KEPPRA) 500 MG tablet Take 1 tablet (500 mg total) by mouth 2 (two) times daily. 12/06/16   Maren Reamer, MD  Lurasidone HCl (LATUDA) 60 MG TABS Take 120 mg by mouth daily.     Historical Provider, MD  meloxicam (MOBIC) 15 MG tablet Take 1 tablet (15 mg total) by mouth 2 (two) times daily. Take with food 10/24/16   Maren Reamer, MD  nicotine (NICODERM CQ) 21 mg/24hr patch Place 1 patch (21 mg total) onto the skin daily. Patient not taking: Reported on 10/28/2016 08/16/16   Maren Reamer, MD  pantoprazole (PROTONIX) 40 MG  tablet Take 1 tablet (40 mg total) by mouth daily. 11/25/16   Maren Reamer, MD  QUEtiapine (SEROQUEL) 50 MG tablet Take 50 mg by mouth at bedtime.     Historical Provider, MD  traZODone (DESYREL) 100 MG tablet Take 100 mg by mouth at bedtime.     Historical Provider, MD  VOLTAREN 1 % GEL Apply 2 g topically 4 (four) times daily. 08/22/16   Maren Reamer, MD     Objective:   Vitals:   12/06/16 1400  BP: (!) 151/82  Pulse: 91  Resp: 16  Temp: 98.6 F (37 C)  TempSrc: Oral  SpO2: 99%    Exam General appearance : Awake, alert, not in any distress. Speech Clear. Not toxic looking, morbid obese, pleasant. HEENT: Atraumatic and Normocephalic, pupils equally reactive to light. Neck: supple, no JVD. No cervical lymphadenopathy.  Chest:Good air entry bilaterally, no added sounds. CVS: S1 S2 regular, no murmurs/gallups or rubs. Abdomen: Bowel sounds active, Non tender and not distended with no gaurding, rigidity or rebound. Extremities: B/L Lower Ext shows no edema, both legs are warm to touch Neurology: Awake alert, and oriented X 3, CN II-XII grossly intact, Non focal Skin:No Rash  Data Review Lab Results  Component Value Date   HGBA1C 5.20 10/09/2015   HGBA1C 4.50 07/07/2015    Depression screen [facility] 2/9 12/06/2016 10/28/2016 08/16/2016 08/16/2016 05/17/2016  Decreased Interest 3 3 3 3 3   Down, Depressed, Hopeless 3 2 2 2 3   PHQ - 2 Score 6 5 5 5 6   Altered sleeping 3 3 3 2 3   Tired, decreased energy 3 3 3 2 3   Change in appetite 3 3 3 2 3   Feeling bad or failure about yourself  2 2 3 2 3   Trouble concentrating 3 3 3 2 3   Moving slowly or fidgety/restless 0 2 2 3  0  Suicidal thoughts 2 2 2 1 2   PHQ-9 Score 22 23 24 19 23       Assessment & Plan   1. HTN (hypertension), benign Elevated, recd low salt diet, info provided - will start hctz 25 mg qd - BASIC METABOLIC PANEL WITH GFR - CBC with Differential - Lipid Panel  2. Myopathy, w/ hx of Guillian barre syndrome,  and Conversion d/o - continue neuro f/u w/ [facility] - Ambulatory referral to Physical Therapy - Ambulatory referral to Occupational Therapy - Ambulatory referral to Psychiatry - will ask CM to see if HHC includes OT and PT.  3. Conversion disorder - defer psyche meds to [facility] or psyche f/u. - Ambulatory referral to Psychiatry - ?malingering, although I do not know what the 2nd gain would be.  4. GBS (Guillain Barre syndrome) ([place]) h/o, being followed by Neuro, per outside neuro notes, they felt pt now  w/ Conversion d/o as cause of persistent weakness.  5. Vitamin D deficiency - VITAMIN D 25 Hydroxy (Vit-D Deficiency, Fractures)  6. B12 deficiency - will rechk levels, if all wnl, than dc b12 shots. - Vitamin B12 - Folate - Methylmalonic acid(mma), rnd urine - Homocystine, urine quant  7. Abnormal TSH - TSH     Patient have been counseled extensively about nutrition and exercise  Return in about 3 months (around 03/08/2017), or if symptoms worsen or fail to improve.  The patient was given clear instructions to go to ER or return to medical center if symptoms don't improve, worsen or new problems develop. The patient verbalized understanding. The patient was told to call to get lab results if they haven't heard anything in the next week.   This note has been created with Surveyor, quantity. Any transcriptional errors are unintentional.   Maren Reamer, MD, [place] [facility] [place], [place]   12/06/2016, 2:54 PM

## 2016-12-06 NOTE — Patient Instructions (Addendum)
Vitamin D 5,000 IU /daily  Goal Salt/sodium <2,000 mg/day.  For your tobacco abuse: Strongly recommend that he stop smoking back and all other inhaled products right away Call 1-800-Quit-Now to get free nicotine replacement from the state of [place]    Low-Sodium Eating Plan Sodium, which is an element that makes up salt, helps you maintain a healthy balance of fluids in your body. Too much sodium can increase your blood pressure and cause fluid and waste to be held in your body. Your health care provider or dietitian may recommend following this plan if you have high blood pressure (hypertension), kidney disease, liver disease, or heart failure. Eating less sodium can help lower your blood pressure, reduce swelling, and protect your heart, liver, and kidneys. What are tips for following this plan? General guidelines   Most people on this plan should limit their sodium intake to 1,500-2,000 mg (milligrams) of sodium each day. Reading food labels   The Nutrition Facts label lists the amount of sodium in one serving of the food. If you eat more than one serving, you must multiply the listed amount of sodium by the number of servings.  Choose foods with less than 140 mg of sodium per serving.  Avoid foods with 300 mg of sodium or more per serving. Shopping   Look for lower-sodium products, often labeled as "low-sodium" or "no salt added."  Always check the sodium content even if foods are labeled as "unsalted" or "no salt added".  Buy fresh foods.  Avoid canned foods and premade or frozen meals.  Avoid canned, cured, or processed meats  Buy breads that have less than 80 mg of sodium per slice. Cooking   Eat more home-cooked food and less restaurant, buffet, and fast food.  Avoid adding salt when cooking. Use salt-free seasonings or herbs instead of table salt or sea salt. Check with your health care provider or pharmacist before using salt substitutes.  Cook with plant-based oils,  such as canola, sunflower, or olive oil. Meal planning   When eating at a restaurant, ask that your food be prepared with less salt or no salt, if possible.  Avoid foods that contain MSG (monosodium glutamate). MSG is sometimes added to [place]inese food, bouillon, and some canned foods. What foods are recommended? The items listed may not be a complete list. Talk with your dietitian about what dietary choices are best for you. Grains  Low-sodium cereals, including oats, puffed wheat and rice, and shredded wheat. Low-sodium crackers. Unsalted rice. Unsalted pasta. Low-sodium bread. Whole-grain breads and whole-grain pasta. Vegetables  Fresh or frozen vegetables. "No salt added" canned vegetables. "No salt added" tomato sauce and paste. Low-sodium or reduced-sodium tomato and vegetable juice. Fruits  Fresh, frozen, or canned fruit. Fruit juice. Meats and other protein foods  Fresh or frozen (no salt added) meat, poultry, seafood, and fish. Low-sodium canned tuna and salmon. Unsalted nuts. Dried peas, beans, and lentils without added salt. Unsalted canned beans. Eggs. Unsalted nut butters. Dairy  Milk. Soy milk. Cheese that is naturally low in sodium, such as ricotta cheese, fresh mozzarella, or Swiss cheese Low-sodium or reduced-sodium cheese. Cream cheese. Yogurt. Fats and oils  Unsalted butter. Unsalted margarine with no trans fat. Vegetable oils such as canola or olive oils. Seasonings and other foods  Fresh and dried herbs and spices. Salt-free seasonings. Low-sodium mustard and ketchup. Sodium-free salad dressing. Sodium-free light mayonnaise. Fresh or refrigerated horseradish. Lemon juice. Vinegar. Homemade, reduced-sodium, or low-sodium soups. Unsalted popcorn and pretzels. Low-salt or  salt-free chips. What foods are not recommended? The items listed may not be a complete list. Talk with your dietitian about what dietary choices are best for you. Grains  Instant hot cereals. Bread stuffing,  pancake, and biscuit mixes. Croutons. Seasoned rice or pasta mixes. Noodle soup cups. Boxed or frozen macaroni and cheese. Regular salted crackers. Self-rising flour. Vegetables  Sauerkraut, pickled vegetables, and relishes. Olives. [place] fries. Onion rings. Regular canned vegetables (not low-sodium or reduced-sodium). Regular canned tomato sauce and paste (not low-sodium or reduced-sodium). Regular tomato and vegetable juice (not low-sodium or reduced-sodium). Frozen vegetables in sauces. Meats and other protein foods  Meat or fish that is salted, canned, smoked, spiced, or pickled. Bacon, ham, sausage, hotdogs, corned beef, chipped beef, packaged lunch meats, salt pork, jerky, pickled herring, anchovies, regular canned tuna, sardines, salted nuts. Dairy  Processed cheese and cheese spreads. Cheese curds. Blue cheese. Feta cheese. String cheese. Regular cottage cheese. Buttermilk. Canned milk. Fats and oils  Salted butter. Regular margarine. Ghee. Bacon fat. Seasonings and other foods  Onion salt, garlic salt, seasoned salt, table salt, and sea salt. Canned and packaged gravies. Worcestershire sauce. Tartar sauce. Barbecue sauce. Teriyaki sauce. Soy sauce, including reduced-sodium. Steak sauce. Fish sauce. Oyster sauce. Cocktail sauce. Horseradish that you find on the shelf. Regular ketchup and mustard. Meat flavorings and tenderizers. Bouillon cubes. Hot sauce and Tabasco sauce. Premade or packaged marinades. Premade or packaged taco seasonings. Relishes. Regular salad dressings. Salsa. Potato and tortilla chips. Corn chips and puffs. Salted popcorn and pretzels. Canned or dried soups. Pizza. Frozen entrees and pot pies. Summary  Eating less sodium can help lower your blood pressure, reduce swelling, and protect your heart, liver, and kidneys.  Most people on this plan should limit their sodium intake to 1,500-2,000 mg (milligrams) of sodium each day.  Canned, boxed, and frozen foods are high in  sodium. Restaurant foods, fast foods, and pizza are also very high in sodium. You also get sodium by adding salt to food.  Try to cook at home, eat more fresh fruits and vegetables, and eat less fast food, canned, processed, or prepared foods. This information is not intended to replace advice given to you by your health care provider. Make sure you discuss any questions you have with your health care provider. Document Released: 02/25/2002 Document Revised: 08/29/2016 Document Reviewed: 08/29/2016 Elsevier Interactive Patient Education  2017 Elsevier Inc.   -  Hypertension Hypertension is another name for high blood pressure. High blood pressure forces your heart to work harder to pump blood. This can cause problems over time. There are two numbers in a blood pressure reading. There is a top number (systolic) over a bottom number (diastolic). It is best to have a blood pressure below 120/80. Healthy choices can help lower your blood pressure. You may need medicine to help lower your blood pressure if:  Your blood pressure cannot be lowered with healthy choices.  Your blood pressure is higher than 130/80. Follow these instructions at home: Eating and drinking   If directed, follow the DASH eating plan. This diet includes:  Filling half of your plate at each meal with fruits and vegetables.  Filling one quarter of your plate at each meal with whole grains. Whole grains include whole wheat pasta, brown rice, and whole grain bread.  Eating or drinking low-fat dairy products, such as skim milk or low-fat yogurt.  Filling one quarter of your plate at each meal with low-fat (lean) proteins. Low-fat proteins include fish, skinless  chicken, eggs, beans, and tofu.  Avoiding fatty meat, cured and processed meat, or chicken with skin.  Avoiding premade or processed food.  Eat less than 1,500 mg of salt (sodium) a day.  Limit alcohol use to no more than 1 drink a day for nonpregnant women  and 2 drinks a day for men. One drink equals 12 oz of beer, 5 oz of wine, or 1 oz of hard liquor. Lifestyle   Work with your doctor to stay at a healthy weight or to lose weight. Ask your doctor what the best weight is for you.  Get at least 30 minutes of exercise that causes your heart to beat faster (aerobic exercise) most days of the week. This may include walking, swimming, or biking.  Get at least 30 minutes of exercise that strengthens your muscles (resistance exercise) at least 3 days a week. This may include lifting weights or pilates.  Do not use any products that contain nicotine or tobacco. This includes cigarettes and e-cigarettes. If you need help quitting, ask your doctor.  Check your blood pressure at home as told by your doctor.  Keep all follow-up visits as told by your doctor. This is important. Medicines   Take over-the-counter and prescription medicines only as told by your doctor. Follow directions carefully.  Do not skip doses of blood pressure medicine. The medicine does not work as well if you skip doses. Skipping doses also puts you at risk for problems.  Ask your doctor about side effects or reactions to medicines that you should watch for. Contact a doctor if:  You think you are having a reaction to the medicine you are taking.  You have headaches that keep coming back (recurring).  You feel dizzy.  You have swelling in your ankles.  You have trouble with your vision. Get help right away if:  You get a very bad headache.  You start to feel confused.  You feel weak or numb.  You feel faint.  You get very bad pain in your:  Chest.  Belly (abdomen).  You throw up (vomit) more than once.  You have trouble breathing. Summary  Hypertension is another name for high blood pressure.  Making healthy choices can help lower blood pressure. If your blood pressure cannot be controlled with healthy choices, you may need to take medicine. This  information is not intended to replace advice given to you by your health care provider. Make sure you discuss any questions you have with your health care provider. Document Released: 02/22/2008 Document Revised: 08/03/2016 Document Reviewed: 08/03/2016 Elsevier Interactive Patient Education  2017 [employer].

## 2016-12-07 ENCOUNTER — Telehealth: Payer: Self-pay | Admitting: Internal Medicine

## 2016-12-07 ENCOUNTER — Other Ambulatory Visit: Payer: Self-pay

## 2016-12-07 LAB — VITAMIN B12: VITAMIN B 12: 599 pg/mL (ref 200–1100)

## 2016-12-07 LAB — VITAMIN D 25 HYDROXY (VIT D DEFICIENCY, FRACTURES): Vit D, 25-Hydroxy: 23 ng/mL — ABNORMAL LOW (ref 30–100)

## 2016-12-07 LAB — FOLATE: FOLATE: 12.1 ng/mL (ref >5.4)

## 2016-12-07 MED ORDER — HYDROCHLOROTHIAZIDE 25 MG PO TABS: 25.0000 mg | ORAL_TABLET | Freq: Every day | ORAL | 3 refills | Status: DC

## 2016-12-07 MED ORDER — VITAMIN D3 125 MCG (5000 UT) PO CAPS: 5000.0000 [IU] | ORAL_CAPSULE | Freq: Every day | ORAL | 3 refills | Status: AC

## 2016-12-07 MED ORDER — GABAPENTIN 300 MG PO CAPS: 300.0000 mg | ORAL_CAPSULE | Freq: Three times a day (TID) | ORAL | 3 refills | Status: DC

## 2016-12-07 MED ORDER — LEVETIRACETAM 500 MG PO TABS: 500.0000 mg | ORAL_TABLET | Freq: Two times a day (BID) | ORAL | 3 refills | Status: AC

## 2016-12-07 MED ORDER — CYCLOBENZAPRINE HCL 5 MG PO TABS: 5.0000 mg | ORAL_TABLET | Freq: Three times a day (TID) | ORAL | 3 refills | Status: DC | PRN

## 2016-12-07 MED FILL — HYDROCHLOROTHIAZIDE 25 MG T: 25 | 30 days supply | Qty: 30 | Fill #0

## 2016-12-07 MED FILL — CYCLOBENZAPRINE 5 MG TABLET: 5 | 10 days supply | Qty: 30 | Fill #0

## 2016-12-07 MED FILL — levETIRAcetam 500 MG TABS: 500 | 30 days supply | Qty: 60 | Fill #0

## 2016-12-07 MED FILL — GABAPENTIN 300 MG CAPSULE: 300 | 30 days supply | Qty: 90 | Fill #0

## 2016-12-07 NOTE — Telephone Encounter (Signed)
Patient called the office to speak with nurse regarding his medication. Pt was seen yesterday and asked that his medication be called in to our pharmacy but it wasn't. Please follow up.  Thank you.

## 2016-12-07 NOTE — Telephone Encounter (Signed)
Resent medications to our pharmacy

## 2016-12-08 LAB — HOMOCYSTEINE: HOMOCYSTEINE: 8.9 umol/L (ref <11.4)

## 2016-12-10 LAB — METHYLMALONIC ACID(MMA), RND URINE
CREATININE RANDOM UR MMA: 17.45 mmol/L (ref 2.38–26.55)
Methylmalonic acid: 0.6 mmol/mol{creat} (ref 0.3–1.9)

## 2016-12-11 ENCOUNTER — Other Ambulatory Visit: Payer: Self-pay | Admitting: Internal Medicine

## 2016-12-11 MED ORDER — FENOFIBRATE 145 MG PO TABS: 145.0000 mg | ORAL_TABLET | Freq: Every day | ORAL | 3 refills | Status: DC

## 2016-12-11 MED ORDER — VITAMIN B-12 1000 MCG PO TABS: 1000.0000 ug | ORAL_TABLET | Freq: Every day | ORAL | 3 refills | Status: DC

## 2016-12-15 ENCOUNTER — Telehealth: Payer: Self-pay

## 2016-12-15 NOTE — Telephone Encounter (Signed)
Contacted pt to go over lab results pt is aware of results and doesn't have any questions or concerns 

## 2016-12-19 ENCOUNTER — Other Ambulatory Visit (HOSPITAL_COMMUNITY): Payer: Self-pay

## 2016-12-19 ENCOUNTER — Other Ambulatory Visit: Payer: Self-pay

## 2016-12-19 MED ORDER — FENOFIBRATE 145 MG PO TABS: 145.0000 mg | ORAL_TABLET | Freq: Every day | ORAL | 3 refills | Status: AC

## 2016-12-19 MED ORDER — VITAMIN B-12 1000 MCG PO TABS: 1000.0000 ug | ORAL_TABLET | Freq: Every day | ORAL | 3 refills | Status: DC

## 2016-12-26 MED FILL — CYCLOBENZAPRINE 5 MG TABLET: 5 | 10 days supply | Qty: 30 | Fill #1

## 2017-01-02 MED FILL — FENOFIBRATE 145 MG TABLET: 145 | 30 days supply | Qty: 30 | Fill #0

## 2017-01-09 ENCOUNTER — Telehealth: Payer: Self-pay | Admitting: Internal Medicine

## 2017-01-09 NOTE — Telephone Encounter (Signed)
Pt. Called requesting a refill for cyclobenzaprine (FLEXERIL) 5 MG tablet Please follow up with Patient

## 2017-01-10 MED ORDER — CYCLOBENZAPRINE HCL 5 MG PO TABS: 5.0000 mg | ORAL_TABLET | Freq: Three times a day (TID) | ORAL | 3 refills | Status: DC | PRN

## 2017-01-10 NOTE — Telephone Encounter (Signed)
I renewed the flexeril and gave him 3 rf.

## 2017-01-11 ENCOUNTER — Other Ambulatory Visit: Payer: Self-pay

## 2017-01-11 MED ORDER — CYCLOBENZAPRINE HCL 5 MG PO TABS: 5.0000 mg | ORAL_TABLET | Freq: Three times a day (TID) | ORAL | 3 refills | Status: DC | PRN

## 2017-01-11 MED FILL — CYCLOBENZAPRINE 5 MG TABLET: 5 | 10 days supply | Qty: 30 | Fill #0

## 2017-01-11 NOTE — Telephone Encounter (Signed)
Resent rx to our pharmacy

## 2017-01-11 NOTE — Telephone Encounter (Signed)
Pt. Called stating that he would like for his cyclobenzaprine (FLEXERIL) 5 MG tablet  To be switched to [facility] pharmacy and not [facility]. Please f/u

## 2017-01-16 ENCOUNTER — Other Ambulatory Visit: Payer: Self-pay | Admitting: Internal Medicine

## 2017-01-16 DIAGNOSIS — J302 Other seasonal allergic rhinitis: Secondary | ICD-10-CM

## 2017-01-16 MED ORDER — CETIRIZINE HCL 10 MG PO TABS: 10.0000 mg | ORAL_TABLET | Freq: Every day | ORAL | 0 refills | Status: DC

## 2017-01-16 NOTE — Telephone Encounter (Signed)
Pt calling to request a refill on Zyrtec. Please send to our pharmacy. Please f/u. Thank you.

## 2017-01-16 NOTE — Telephone Encounter (Signed)
Cetirizine refilled.

## 2017-01-25 ENCOUNTER — Other Ambulatory Visit: Payer: Self-pay

## 2017-01-25 ENCOUNTER — Telehealth: Payer: Self-pay | Admitting: Internal Medicine

## 2017-01-25 DIAGNOSIS — J302 Other seasonal allergic rhinitis: Secondary | ICD-10-CM

## 2017-01-25 MED ORDER — CETIRIZINE HCL 10 MG PO TABS: 10.0000 mg | ORAL_TABLET | Freq: Every day | ORAL | 3 refills | Status: AC

## 2017-01-25 MED FILL — ?CETIRIZINE HCL 10 MG TABLE: 10 | 30 days supply | Qty: 30 | Fill #0

## 2017-01-25 NOTE — Telephone Encounter (Signed)
Pt. Called to inform his nurse to change his pharmacy to [facility]. Pt. States that he does not want his medication sent to [facility]. Please f/u

## 2017-01-31 MED FILL — CYCLOBENZAPRINE 5 MG TABLET: 5 | 10 days supply | Qty: 30 | Fill #1

## 2017-02-01 ENCOUNTER — Telehealth: Payer: Self-pay | Admitting: Internal Medicine

## 2017-02-01 NOTE — Telephone Encounter (Signed)
Patient called the office asking to speak with provider in regards to getting more information an  appetite oppressor. He was told by nurse in the psychiatrist told him to get this information. Please follow up.  Thank you.

## 2017-02-01 NOTE — Telephone Encounter (Signed)
Please schedule pt with new pcp

## 2017-02-01 NOTE — Telephone Encounter (Signed)
He can make appt w/ provider to discuss options. It all depends on provider preference and comfort levels for rx.  I personally do not prescribed weight loss pills due to side effects.

## 2017-02-01 NOTE — Telephone Encounter (Signed)
Will forward to pcp

## 2017-02-02 ENCOUNTER — Encounter: Payer: Self-pay | Admitting: Internal Medicine

## 2017-02-09 ENCOUNTER — Encounter: Payer: Self-pay | Admitting: Internal Medicine

## 2017-02-10 ENCOUNTER — Encounter: Payer: Self-pay | Admitting: Internal Medicine

## 2017-02-10 ENCOUNTER — Ambulatory Visit: Payer: Medicaid Other | Attending: Internal Medicine | Admitting: Internal Medicine

## 2017-02-10 VITALS — BP 141/92 | HR 18 | Temp 98.0°F | Resp 18 | Ht 73.0 in | Wt 334.0 lb

## 2017-02-10 DIAGNOSIS — R51 Headache: Secondary | ICD-10-CM | POA: Diagnosis present

## 2017-02-10 DIAGNOSIS — F449 Dissociative and conversion disorder, unspecified: Secondary | ICD-10-CM | POA: Insufficient documentation

## 2017-02-10 DIAGNOSIS — F17200 Nicotine dependence, unspecified, uncomplicated: Secondary | ICD-10-CM | POA: Diagnosis not present

## 2017-02-10 DIAGNOSIS — G40909 Epilepsy, unspecified, not intractable, without status epilepticus: Secondary | ICD-10-CM | POA: Insufficient documentation

## 2017-02-10 DIAGNOSIS — I10 Essential (primary) hypertension: Secondary | ICD-10-CM | POA: Insufficient documentation

## 2017-02-10 DIAGNOSIS — K2990 Gastroduodenitis, unspecified, without bleeding: Secondary | ICD-10-CM | POA: Diagnosis not present

## 2017-02-10 DIAGNOSIS — E669 Obesity, unspecified: Secondary | ICD-10-CM | POA: Insufficient documentation

## 2017-02-10 DIAGNOSIS — G610 Guillain-Barre syndrome: Secondary | ICD-10-CM | POA: Insufficient documentation

## 2017-02-10 DIAGNOSIS — E559 Vitamin D deficiency, unspecified: Secondary | ICD-10-CM | POA: Diagnosis not present

## 2017-02-10 DIAGNOSIS — G729 Myopathy, unspecified: Secondary | ICD-10-CM | POA: Diagnosis not present

## 2017-02-10 DIAGNOSIS — M4802 Spinal stenosis, cervical region: Secondary | ICD-10-CM | POA: Insufficient documentation

## 2017-02-10 DIAGNOSIS — K2970 Gastritis, unspecified, without bleeding: Secondary | ICD-10-CM | POA: Insufficient documentation

## 2017-02-10 DIAGNOSIS — F319 Bipolar disorder, unspecified: Secondary | ICD-10-CM | POA: Insufficient documentation

## 2017-02-10 DIAGNOSIS — G8929 Other chronic pain: Secondary | ICD-10-CM | POA: Insufficient documentation

## 2017-02-10 DIAGNOSIS — M549 Dorsalgia, unspecified: Secondary | ICD-10-CM

## 2017-02-10 MED ORDER — VOLTAREN 1 % TD GEL: 2.0000 g | Freq: Four times a day (QID) | TRANSDERMAL | 2 refills | Status: AC

## 2017-02-10 MED ORDER — AMLODIPINE BESYLATE 5 MG PO TABS: 5.0000 mg | ORAL_TABLET | Freq: Every day | ORAL | 3 refills | Status: DC

## 2017-02-10 MED FILL — ?AMLODIPINE BESYLATE 5 MG T: 5 | 30 days supply | Qty: 30 | Fill #0

## 2017-02-10 MED FILL — VOLTAREN 1% GEL: 1 | 12 days supply | Qty: 100 | Fill #0

## 2017-02-10 NOTE — Patient Instructions (Signed)
Please discuss headaches with neurologist when you see him next month.    Please call and make appointment with new psychiatrist.  You must keep appointment for physical therapy to be evaluated for wheelchair.

## 2017-02-10 NOTE — Progress Notes (Signed)
Patient ID: Donald Mckay., male    DOB: 25-Mar-1991  MRN: [id number]  CC:    Subjective:  Donald Mckay is a 26 y.o. male who presents for routine f/u  visit.  Donald Mckay, his care giver is with him [facility]rom [facility].  His concerns today include:  Last saw Dr. Clide Dales in March Patient with history of residual myopathy associated with GBS, conversion disorder, tobacco dependence, chronic headaches, seizure disorder vitamin D deficiency, bipolar disorder, obesity and chronic back pain. He has multiple concerns today but I have asked him to prioritize his top 2 or 3 to be address today  1. C/o headache and  Upper back pain both of which are chronic. -HA all over for >1 yr.  "Feel like my head caving in" -+ blurred vision, no N/V -Occur daily and last couple hrs -Flexeril use to help but not at all. Was suppose to be on Flexeril 5 mg TID but states he takes 20 mg TID and still not helping -tried with Topamax in past by neurologist.  Stopped because of some S.E -had MRI brain 05/2015  -MRI of the C-spine done in March 2017 revealed mild facet arthropathy with mild neural foraminal stenosis C3-C5 -MRI of thoracic spine done in March 2017 revealed mild multilevel degenerative changes worse at T 11 through T12  2.  Request rxn for new wheel chair.  Wheel broke last yr. -has a walker at home -staying in bed.  Using walker only to go to bath room -can walk about 10 steps before legs give out.   -"I have to watch my legs because I cann't feel them"  however according to notes from [facility] patient had an EMG in March 2017 that was completely normal with no evidence of neuropathy or myopathy  -PCS Mon-Fri 2.5s, and Sat 2.45 mins.  Pt lives alone.  -Referred to PT and OT by Dr. Clide Dales on last office visit. According to referral note they did call the patient and patient stated that he will call back to schedule. Today he states he does not recall this and that he  was never called for the appointment    3. SZ - had sz 1.5 mth ago.  Seen at [facility].  4.  Conversion - not going to [facility].  Missed appts.  States he forgets his appts.  5.  Hypertension: Reports HCTZ was discontinued by one of his physicians at [facility].      Patient Active Problem List   Diagnosis Date Noted  . Bipolar disorder ([place]) 11/14/2016  . Gastritis and gastroduodenitis 03/14/2016  . Vitamin D deficiency 12/02/2015  . Chest pain 11/21/2015  . Seizures ([place]) 10/09/2015  . Recurrent tonsillitis 10/09/2015  . Obesity 10/09/2015  . Back pain 09/02/2015  . 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 Headache 07/09/2015  . Paresthesia 07/07/2015  . 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 GBS (Guillain Barre syndrome) ([place]) 05/24/2015  . Encounter for lumbar puncture   . Depression 05/15/2015  . Tobacco use disorder 05/12/2015     Current Outpatient Prescriptions on File Prior to Visit  Medication Sig Dispense Refill  . Blood Pressure KIT Check blood pressure as directed 1 each 0  . cetirizine (ZYRTEC) 10 MG tablet Take 1 tablet (10 mg total) by mouth daily. 30 tablet 3  . Cholecalciferol (VITAMIN D3) 5000 units CAPS Take 1 capsule (5,000 Units total) by mouth daily. 90 capsule 3  . cyclobenzaprine (FLEXERIL) 5 MG tablet Take 1 tablet (5 mg total)  by mouth 3 (three) times daily as needed for muscle spasms. 30 tablet 3  . gabapentin (NEURONTIN) 300 MG capsule Take 1 capsule (300 mg total) by mouth 3 (three) times daily. 90 capsule 3  . levETIRAcetam (KEPPRA) 500 MG tablet Take 1 tablet (500 mg total) by mouth 2 (two) times daily. 180 tablet 3  . pantoprazole (PROTONIX) 40 MG tablet Take 1 tablet (40 mg total) by mouth daily. 30 tablet 3  . fenofibrate (TRICOR) 145 MG tablet Take 1 tablet (145 mg total) by mouth daily. (Patient not taking: Reported on 02/10/2017) 90 tablet 3  . Lurasidone HCl (LATUDA) 60 MG TABS Take 120 mg by mouth daily.     . nicotine (NICODERM CQ) 21 mg/24hr patch Place 1 patch  (21 mg total) onto the skin daily. (Patient not taking: Reported on 10/28/2016) 28 patch 0  . QUEtiapine (SEROQUEL) 50 MG tablet Take 50 mg by mouth at bedtime.     . traZODone (DESYREL) 100 MG tablet Take 100 mg by mouth at bedtime.      No current facility-administered medications on file prior to visit.     Allergies  Allergen Reactions  . Ibuprofen Other (See Comments)    Reaction:  Nose bleeds   . Fluzone [Flu Virus Vaccine] Other (See Comments)    Pt with documented diagnosis of Guillain-Barre syndrome.     ROS: Review of Systems As stated above  PHYSICAL EXAM: BP (!) 141/92 (BP Location: Left Arm, Patient Position: Sitting, Cuff Size: Large)   Pulse (!) 18   Temp 98 F (36.7 C) (Oral)   Resp 18   Ht 6' 1"  (1.854 m)   Wt (!) 334 lb (151.5 kg)   SpO2 97%   BMI 44.07 kg/m   Physical Exam  General appearance - alert, well appearing, obese male sitting in wheelchair and in no distress Mental status - normal mood, behavior, speech, dress, motor activity, and thought processes Neck - supple, no significant adenopathy Chest - clear to auscultation, no wheezes, rales or rhonchi, symmetric air entry Heart - normal rate, regular rhythm, normal S1, S2, no murmurs, rubs, clicks or gallops Neurological - grip 3/5 with poor effort exhibited.  Noted to hold onto side of wheelchair with good grip Power in the upper extremities proximally and distally 5/ 5 bilaterally Power in the right lower leg 4+ out of 5.  Left lower leg to about 5 Extremities - no LE edema  Depression screen [facility] 2/9 02/10/2017  Decreased Interest 3  Down, Depressed, Hopeless 2  PHQ - 2 Score 5  Altered sleeping 3  Tired, decreased energy 3  Change in appetite 3  Feeling bad or failure about yourself  2  Trouble concentrating 3  Moving slowly or fidgety/restless 1  Suicidal thoughts 0  PHQ-9 Score 20  Some recent data might be hidden    ASSESSMENT AND PLAN: 1. HTN (hypertension), benign -change HCTZ  to Norvasc - amLODipine (NORVASC) 5 MG tablet; Take 1 tablet (5 mg total) by mouth daily.  Dispense: 90 tablet; Refill: 3  2. Chronic intractable headache, unspecified headache type -Advised patient to discuss further with his neurologist when he sees him next month -Advised against self escalation of medication as he has done with Flexeril.  I recommend going back to 5 mg 3 times a day when necessary  3. Myopathy 4. GBS (Guillain Barre syndrome) ([place]) I have resubmitted referral for physical therapy. I strongly advised that he keep the appointment once it scheduled  for him.  He has been referred by [facility] and Dr. Clide Dales for PT and OT in the past but he seems unmotivated -Prescription given for manual wheelchair given his report that he can only take a few steps with his walker before leg gives out.   A manual wheelchair is recommended to allow Donald Heck Yanni Jr. to safely negotiate in the home and community environment.  The patient is at high risk for falls due to their medical condition.  Assistive devices, such as a cane or walker do not provide adequate support for independent ambulation.  I am recommending a manual wheelchair for safety and functionality in and around the home.  - Ambulatory referral to Physical Therapy - DME Wheelchair manual  4. GBS (Guillain Barre syndrome) ([place]) - DME Wheelchair manual  5. Conversion disorder  -Given this history and history of bipolar disorder, I recommended the patient get established with another community psychiatrist if he does not intend to go back to [place].  I will ask our LCSW to assist with this  6. Other chronic back pain - VOLTAREN 1 % GEL; Apply 2 g topically 4 (four) times daily.  Dispense: 100 g; Refill: 2  Patient was given the opportunity to ask questions.  Patient verbalized understanding of the plan and was able to repeat key elements of the plan.   Orders Placed This Encounter  Procedures  . DME  Wheelchair manual  . Ambulatory referral to Physical Therapy     Requested Prescriptions   Signed Prescriptions Disp Refills  . amLODipine (NORVASC) 5 MG tablet 90 tablet 3    Sig: Take 1 tablet (5 mg total) by mouth daily.  . VOLTAREN 1 % GEL 100 g 2    Sig: Apply 2 g topically 4 (four) times daily.    No future appointments.  Karle Plumber, MD, FACP

## 2017-02-14 MED FILL — CYCLOBENZAPRINE 5 MG TABLET: 5 | 10 days supply | Qty: 30 | Fill #2

## 2017-02-15 ENCOUNTER — Telehealth: Payer: Self-pay | Admitting: Licensed Clinical Social Worker

## 2017-02-15 MED FILL — ?PANTOPRAZOLE SOD DR 40MG: 40 MG | 30 days supply | Qty: 30 | Fill #1

## 2017-02-15 NOTE — Telephone Encounter (Signed)
LCSWA contacted pt via telephone. LCSWA disclosed that she received a consult from Dr. Laural BenesJohnson to provide assistance in obtaining a mental health provider.   Pt disclosed that he is no longer with [facility] for medication management due to them losing his paperwork on multiple occasions and not informing him of scheduled appointments. He has been out of his medications for approximately three months. No report of SI/HI/AVH. Pt reported that he continues to smoke marijuana daily to cope with mental health; however, he is open to re-initiating medication management.   LCSWA provided pt information on available resources and agreed to send information via mail. No additional concerns noted.

## 2017-03-07 ENCOUNTER — Telehealth: Payer: Self-pay | Admitting: Internal Medicine

## 2017-03-07 MED ORDER — CYCLOBENZAPRINE HCL 5 MG PO TABS: 5.0000 mg | ORAL_TABLET | Freq: Three times a day (TID) | ORAL | 3 refills | Status: DC | PRN

## 2017-03-07 MED FILL — CYCLOBENZAPRINE 5 MG TABLET: 5 | 10 days supply | Qty: 30 | Fill #0

## 2017-03-07 NOTE — Telephone Encounter (Signed)
Will RF Flexeril 03/13/2017.  Each rxn to last at least a mth

## 2017-03-07 NOTE — Telephone Encounter (Signed)
Patient is requesting refill for flexeril please follow up

## 2017-03-14 ENCOUNTER — Telehealth: Payer: Self-pay | Admitting: Physical Therapy

## 2017-03-14 ENCOUNTER — Ambulatory Visit: Payer: Medicaid Other | Admitting: Physical Therapy

## 2017-03-14 NOTE — Telephone Encounter (Signed)
Contacted patient regarding PT evaluation scheduled for today to discuss specific wheelchair needs in order to determine if formal w/c evaluation with vendor needed to be scheduled.  Pt states he got a new w/c last week and would not be needing the w/c evaluation.  When asked if he would be coming this afternoon for the regular PT evaluation pt stated, "well I really need to but I was hoping we could reschedule.  I am on my way to the emergency room because of my back pain."  Informed pt we would cancel this afternoon's PT evaluation and would call him tomorrow to reschedule.  Pt agreeable.  Edman CircleAudra Hall, PT, DPT 03/14/17    12:15 PM

## 2017-03-20 ENCOUNTER — Encounter: Payer: Self-pay | Admitting: Internal Medicine

## 2017-03-20 ENCOUNTER — Ambulatory Visit: Payer: Medicaid Other | Attending: Internal Medicine | Admitting: Internal Medicine

## 2017-03-20 VITALS — BP 131/82 | HR 116 | Temp 98.7°F | Resp 16

## 2017-03-20 DIAGNOSIS — E669 Obesity, unspecified: Secondary | ICD-10-CM | POA: Diagnosis not present

## 2017-03-20 DIAGNOSIS — M546 Pain in thoracic spine: Secondary | ICD-10-CM | POA: Diagnosis not present

## 2017-03-20 DIAGNOSIS — G40909 Epilepsy, unspecified, not intractable, without status epilepticus: Secondary | ICD-10-CM | POA: Diagnosis not present

## 2017-03-20 DIAGNOSIS — F17210 Nicotine dependence, cigarettes, uncomplicated: Secondary | ICD-10-CM | POA: Diagnosis not present

## 2017-03-20 DIAGNOSIS — M545 Low back pain: Secondary | ICD-10-CM | POA: Insufficient documentation

## 2017-03-20 DIAGNOSIS — F319 Bipolar disorder, unspecified: Secondary | ICD-10-CM | POA: Diagnosis not present

## 2017-03-20 DIAGNOSIS — F449 Dissociative and conversion disorder, unspecified: Secondary | ICD-10-CM | POA: Diagnosis not present

## 2017-03-20 DIAGNOSIS — E559 Vitamin D deficiency, unspecified: Secondary | ICD-10-CM | POA: Insufficient documentation

## 2017-03-20 DIAGNOSIS — R51 Headache: Secondary | ICD-10-CM | POA: Diagnosis not present

## 2017-03-20 DIAGNOSIS — G8929 Other chronic pain: Secondary | ICD-10-CM | POA: Insufficient documentation

## 2017-03-20 DIAGNOSIS — G610 Guillain-Barre syndrome: Secondary | ICD-10-CM | POA: Diagnosis not present

## 2017-03-20 DIAGNOSIS — Z79899 Other long term (current) drug therapy: Secondary | ICD-10-CM | POA: Insufficient documentation

## 2017-03-20 MED ORDER — MELOXICAM 15 MG PO TABS: 15.0000 mg | ORAL_TABLET | Freq: Every day | ORAL | 1 refills | Status: AC

## 2017-03-20 MED ORDER — MELOXICAM 15 MG PO TABS: 15.0000 mg | ORAL_TABLET | Freq: Every day | ORAL | 1 refills | Status: DC

## 2017-03-20 NOTE — Patient Instructions (Signed)
Start Mobic 15 mg daily. Continue Flexeril as ordered. You have been referred to Pain Management/Physical med and rehab specialist.  I have requested an MRI of your thoracic spine.

## 2017-03-20 NOTE — Progress Notes (Signed)
Patient ID: Donald Heck [name], male    DOB: 09-26-1990  MRN: [id number]  CC: Back Pain   Subjective: Donald Mckay is a 26 y.o. male who presents for UC visit. [name], his care giver from [facility] is with him. His concerns today include:  Patient with history of residual myopathy associated with GBS, conversion disorder, tobacco dependence, chronic headaches, seizure disorder vitamin D deficiency, bipolar disorder, obesity and chronic back pain. Last seen a little over a month ago with multiple complaints. He was told to follow-up with his neurologist for chronic intractable headaches, referred to physical therapy and HCTZ was changed to Norvasc for blood pressure.  Today: "My back pain is horrible and the Flexeril and gel aint doing nothing."   -pain b/w upper and lower back. He points to upper to mid thoracic area. -constant -prevents him from going out of the house and getting up to cook. " I've been in bed for past 3 days."  -before back pain started getting worse he was getting up and using walker daily to QOD. -Pain does not radiate but sometimes he feels sensation of Needles over the left shoulder -no fever.  -Referred to physical therapy on last visit. His name is currently in the [facility] outpatient rehabilitation work Q -MRI of the C-spine done in March 2017 revealed mild facet arthropathy with mild neural foraminal stenosis C3-C5 -MRI of thoracic spine done in March 2017 revealed mild multilevel degenerative changes worse at T 11 through T12  Patient Active Problem List   Diagnosis Date Noted  . Bipolar disorder ([place]) 11/14/2016  . Gastritis and gastroduodenitis 03/14/2016  . Vitamin D deficiency 12/02/2015  . Chest pain 11/21/2015  . Seizures ([place]) 10/09/2015  . Recurrent tonsillitis 10/09/2015  . Obesity 10/09/2015  . Back pain 09/02/2015  . 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 Headache 07/09/2015  . Paresthesia 07/07/2015  . Conversion disorder  07/04/2015  . GBS (Guillain Barre syndrome) ([place]) 05/24/2015  . Encounter for lumbar puncture   . Depression 05/15/2015  . Tobacco use disorder 05/12/2015     Current Outpatient Prescriptions on File Prior to Visit  Medication Sig Dispense Refill  . amLODipine (NORVASC) 5 MG tablet Take 1 tablet (5 mg total) by mouth daily. 90 tablet 3  . Blood Pressure KIT Check blood pressure as directed 1 each 0  . cetirizine (ZYRTEC) 10 MG tablet Take 1 tablet (10 mg total) by mouth daily. 30 tablet 3  . Cholecalciferol (VITAMIN D3) 5000 units CAPS Take 1 capsule (5,000 Units total) by mouth daily. 90 capsule 3  . cyclobenzaprine (FLEXERIL) 5 MG tablet Take 1 tablet (5 mg total) by mouth 3 (three) times daily as needed for muscle spasms. 30 tablet 3  . fenofibrate (TRICOR) 145 MG tablet Take 1 tablet (145 mg total) by mouth daily. (Patient not taking: Reported on 02/10/2017) 90 tablet 3  . gabapentin (NEURONTIN) 300 MG capsule Take 1 capsule (300 mg total) by mouth 3 (three) times daily. 90 capsule 3  . levETIRAcetam (KEPPRA) 500 MG tablet Take 1 tablet (500 mg total) by mouth 2 (two) times daily. 180 tablet 3  . Lurasidone HCl (LATUDA) 60 MG TABS Take 120 mg by mouth daily.     . nicotine (NICODERM CQ) 21 mg/24hr patch Place 1 patch (21 mg total) onto the skin daily. (Patient not taking: Reported on 10/28/2016) 28 patch 0  . pantoprazole (PROTONIX) 40 MG tablet Take 1 tablet (40 mg total) by mouth daily. [place]  tablet 3  . QUEtiapine (SEROQUEL) 50 MG tablet Take 50 mg by mouth at bedtime.     . traZODone (DESYREL) 100 MG tablet Take 100 mg by mouth at bedtime.     . VOLTAREN 1 % GEL Apply 2 g topically 4 (four) times daily. 100 g 2   No current facility-administered medications on file prior to visit.     Allergies  Allergen Reactions  . Ibuprofen Other (See Comments)    Reaction:  Nose bleeds   . Fluzone [Flu Virus Vaccine] Other (See Comments)    Pt with documented diagnosis of Guillain-Barre  syndrome.    Social History   Social History  . Marital status: Single    Spouse name: N/A  . Number of children: N/A  . Years of education: N/A   Occupational History  . Not on file.   Social History Main Topics  . Smoking status: Current Every Day Smoker    Packs/day: 0.25    Types: Cigarettes  . Smokeless tobacco: Current User  . Alcohol use No     Comment: occasional   . Drug use: Yes    Types: Marijuana     Comment: pt smokes marijuana daily   . Sexual activity: Yes   Other Topics Concern  . Not on file   Social History Narrative  . No narrative on file    Family History  Problem Relation Age of Onset  . Cancer Mother   . Stroke Maternal Grandmother     Past Surgical History:  Procedure Laterality Date  . Nasal Cauterization    . TONSILLECTOMY      ROS: Review of Systems As stated above PHYSICAL EXAM: BP 131/82   Pulse (!) 116   Temp 98.7 F (37.1 C) (Oral)   Resp 16   SpO2 97%   Physical Exam General appearance - alert, well appearing, and in no distress. He is in wheelchair which she self propels Mental status - alert, oriented to person, place, and time, normal mood, behavior, speech, dress, motor activity, and thought processes Chest - clear to auscultation, no wheezes, rales or rhonchi, symmetric air entry Musculoskeletal - mild tenderness on palpation of the thoracic spine more so mid thoracic spine. No tenderness on palpation of the paraspinal muscles  ASSESSMENT AND PLAN: 1. Chronic midline thoracic back pain -Patient presents with worsening chronic back pain with no aggravating factors other than he is sedentary. -We will refer to pain clinic to see Dr. Quentin Cornwall who is a PMR physician -get MRI of thoracic spine and start on Mobic. History of nose bleeds with ibuprofen in the past. Continue Flexeril as needed -Await physical therapy appointment - Ambulatory referral to [place]; Future - meloxicam  (MOBIC) 15 MG tablet; Take 1 tablet (15 mg total) by mouth daily.  Dispense: 30 tablet; Refill: 1  Patient was given the opportunity to ask questions.  Patient verbalized understanding of the plan and was able to repeat key elements of the plan.   Orders Placed This Encounter  Procedures  . MR THORACIC SPINE W WO CONTRAST  . Ambulatory referral to Pain Clinic     Requested Prescriptions   Signed Prescriptions Disp Refills  . meloxicam (MOBIC) 15 MG tablet 30 tablet 1    Sig: Take 1 tablet (15 mg total) by mouth daily.    Return if symptoms worsen or fail to improve.  Karle Plumber, MD, FACP

## 2017-03-27 MED FILL — CYCLOBENZAPRINE 5 MG TABLET: 5 | 10 days supply | Qty: 30 | Fill #1

## 2017-03-27 MED FILL — GABAPENTIN 300 MG CAPSULE: 300 | 30 days supply | Qty: 90 | Fill #1

## 2017-03-28 ENCOUNTER — Encounter (HOSPITAL_COMMUNITY): Payer: Self-pay | Admitting: Radiology

## 2017-03-28 ENCOUNTER — Ambulatory Visit (HOSPITAL_COMMUNITY)
Admission: RE | Admit: 2017-03-28 | Discharge: 2017-03-28 | Disposition: A | Discharge status: Home / Self Care | Payer: Medicaid Other | Source: Ambulatory Visit | Attending: Internal Medicine | Admitting: Internal Medicine

## 2017-03-28 DIAGNOSIS — M5124 Other intervertebral disc displacement, thoracic region: Secondary | ICD-10-CM | POA: Insufficient documentation

## 2017-03-28 DIAGNOSIS — M546 Pain in thoracic spine: Secondary | ICD-10-CM | POA: Diagnosis present

## 2017-03-28 DIAGNOSIS — G8929 Other chronic pain: Secondary | ICD-10-CM

## 2017-03-28 MED ORDER — GADOBENATE DIMEGLUMINE 529 MG/ML IV SOLN
20.0000 mL | Freq: Once | INTRAVENOUS | Status: AC | PRN
  Administered 2017-03-28: 20 mL via INTRAVENOUS

## 2017-04-06 MED FILL — ?PANTOPRAZOLE SOD DR 40MG: 40 MG | 30 days supply | Qty: 30 | Fill #2

## 2017-04-06 MED FILL — CYCLOBENZAPRINE 5 MG TABLET: 5 | 10 days supply | Qty: 30 | Fill #2

## 2017-04-07 ENCOUNTER — Encounter: Payer: Self-pay | Admitting: Internal Medicine

## 2017-04-11 ENCOUNTER — Telehealth: Payer: Self-pay | Admitting: Internal Medicine

## 2017-04-11 NOTE — Telephone Encounter (Signed)
Pt. Called requesting a letter form PCP stating that he is unable to work due to his condition. Pt. Would also like to know if PCP can list the reason why pt. Is unable to work. Please f/u with pt.

## 2017-04-11 NOTE — Telephone Encounter (Signed)
Will forward to pcp

## 2017-04-13 ENCOUNTER — Encounter: Payer: Self-pay | Admitting: Internal Medicine

## 2017-04-13 ENCOUNTER — Ambulatory Visit: Payer: Medicaid Other | Attending: Internal Medicine | Admitting: Internal Medicine

## 2017-04-13 VITALS — BP 148/94 | HR 104 | Temp 98.6°F | Resp 16

## 2017-04-13 DIAGNOSIS — E669 Obesity, unspecified: Secondary | ICD-10-CM | POA: Diagnosis not present

## 2017-04-13 DIAGNOSIS — F447 Conversion disorder with mixed symptom presentation: Secondary | ICD-10-CM | POA: Diagnosis not present

## 2017-04-13 DIAGNOSIS — F449 Dissociative and conversion disorder, unspecified: Secondary | ICD-10-CM | POA: Diagnosis not present

## 2017-04-13 DIAGNOSIS — G40909 Epilepsy, unspecified, not intractable, without status epilepticus: Secondary | ICD-10-CM | POA: Diagnosis not present

## 2017-04-13 DIAGNOSIS — G729 Myopathy, unspecified: Secondary | ICD-10-CM

## 2017-04-13 DIAGNOSIS — R200 Anesthesia of skin: Secondary | ICD-10-CM | POA: Diagnosis not present

## 2017-04-13 DIAGNOSIS — E538 Deficiency of other specified B group vitamins: Secondary | ICD-10-CM | POA: Insufficient documentation

## 2017-04-13 DIAGNOSIS — R531 Weakness: Secondary | ICD-10-CM | POA: Insufficient documentation

## 2017-04-13 DIAGNOSIS — M5124 Other intervertebral disc displacement, thoracic region: Secondary | ICD-10-CM | POA: Insufficient documentation

## 2017-04-13 DIAGNOSIS — M549 Dorsalgia, unspecified: Secondary | ICD-10-CM | POA: Diagnosis present

## 2017-04-13 DIAGNOSIS — G8929 Other chronic pain: Secondary | ICD-10-CM | POA: Insufficient documentation

## 2017-04-13 DIAGNOSIS — I10 Essential (primary) hypertension: Secondary | ICD-10-CM | POA: Insufficient documentation

## 2017-04-13 DIAGNOSIS — F17210 Nicotine dependence, cigarettes, uncomplicated: Secondary | ICD-10-CM | POA: Insufficient documentation

## 2017-04-13 DIAGNOSIS — Z823 Family history of stroke: Secondary | ICD-10-CM | POA: Insufficient documentation

## 2017-04-13 DIAGNOSIS — F319 Bipolar disorder, unspecified: Secondary | ICD-10-CM | POA: Insufficient documentation

## 2017-04-13 DIAGNOSIS — E559 Vitamin D deficiency, unspecified: Secondary | ICD-10-CM | POA: Insufficient documentation

## 2017-04-13 DIAGNOSIS — Z79899 Other long term (current) drug therapy: Secondary | ICD-10-CM | POA: Insufficient documentation

## 2017-04-13 DIAGNOSIS — F419 Anxiety disorder, unspecified: Secondary | ICD-10-CM | POA: Diagnosis not present

## 2017-04-13 DIAGNOSIS — Z809 Family history of malignant neoplasm, unspecified: Secondary | ICD-10-CM | POA: Insufficient documentation

## 2017-04-13 NOTE — Progress Notes (Signed)
Patient ID: Donald Mckay., male    DOB: August 01, 1991  MRN: [id number]  CC: Back Pain   Subjective: Donald Mckay is a 26 y.o. male who presents for UC visit.  His aide is with him. His concerns today include:  Patient with history of residual myopathy associated with GBS, conversion disorder, tobacco dependence,chronic headaches, seizure disorder, HTN, vitamin D deficiency, bipolar disorder, obesity and chronic back pain. Last seen  7/2 with complaints of worsening thoracic back pain. MRI was ordered, both prick started and patient referred to pain clinic.  We will also awaiting physical therapy appointment which he had delayed making when he spoke with a therapist last month.  Today he is requesting a letter stating that he is disabled and unable to work in anticipation of a disability hearing that has not been scheduled as yet but he hopes to happen within the next few months. -He has not received a referral for pain specialist as yet. Wanting to know if there is anything else that can be given for his back pain. -He has not seen his neurologist at [place] since December of last year. I have copied some information below from that visit from [place]: Patient Summary Donald Mckay is a 26 y.o. male with a PMH of conversion disorder, bipolar disorder, severe anxiety/depression, seizure disorder, history of GBS in 2016, vitamin B12 deficiency, and sleep disturbance who presented initially to [facility] on 11/24/2015 with subacute ascending numbness. Patient was treated for GBS in August 2016 at [facility], at which time he presented with LLE weakness and numbness and had visited multiple hospitals in the past with similar complaints; had a GI but 2-3 weeks prior to symptom onset. He received an extensive workup at that time including multiple LPs, EMG/NCV, visual and somatosensory EPs, and MRI brain, CTL spine WWO which were all normal per Care Everywhere. He was also treated with  IVIG which he reports gave him some improvement for a few weeks however he got worse again after. Since then he has had stable sensation loss and gait difficulties. He follows regularly with Psychiatry at [facility] for multiple psychiatric diagnoses as above. He previously had vitamin B12 level in the lower ranges (254) in the past, but declined to take B12 supplement. Assessment/Plan  Donald Mckay is a 26 y.o. male with PMHx as above, who presents for follow-up of numbness and weakness suspected to have functional neurological disorder. Repeat EMG/NCV twice this year has been normal without evidence of neuropathy or myopathy. Has yet to complete somatosensory evoked potential testing or establish with PT or OT. Had lengthy discussion regarding suspected diagnosis and prognosis as well as importance of therapy and rehab in potential recovery.  - No new imaging indicated at this time, could consider if symptoms worsen. Asked patient/mom to obtain disc with MRIs from [facility] - Reordered somatosensory evoked potentials today - Reordered PT/OT as external referral - Agree with continued vitamin B12 supplementation  - Continue to follow with Psychiatry - RTC in 6 months    Today he also is requesting testing for factor V Leiden deficiency. States that his uncle has blood clots and was found to have this deficiency. His mother told him that he should be tested    Blood pressure was elevated when he first came in today She has taken Norvasc already for today Patient Active Problem List   Diagnosis Date Noted  . Bipolar disorder ([place]) 11/14/2016  . Gastritis and gastroduodenitis 03/14/2016  .  Vitamin D deficiency 12/02/2015  . Chest pain 11/21/2015  . Seizures ([place]) 10/09/2015  . Recurrent tonsillitis 10/09/2015  . Obesity 10/09/2015  . Back pain 09/02/2015  . 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 Headache 07/09/2015  . Paresthesia 07/07/2015  . 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 GBS (Guillain Barre syndrome)  ([place]) 05/24/2015  . Encounter for lumbar puncture   . Depression 05/15/2015  . Tobacco use disorder 05/12/2015     Current Outpatient Prescriptions on File Prior to Visit  Medication Sig Dispense Refill  . amLODipine (NORVASC) 5 MG tablet Take 1 tablet (5 mg total) by mouth daily. 90 tablet 3  . Blood Pressure KIT Check blood pressure as directed 1 each 0  . cetirizine (ZYRTEC) 10 MG tablet Take 1 tablet (10 mg total) by mouth daily. 30 tablet 3  . Cholecalciferol (VITAMIN D3) 5000 units CAPS Take 1 capsule (5,000 Units total) by mouth daily. 90 capsule 3  . cyclobenzaprine (FLEXERIL) 5 MG tablet Take 1 tablet (5 mg total) by mouth 3 (three) times daily as needed for muscle spasms. 30 tablet 3  . fenofibrate (TRICOR) 145 MG tablet Take 1 tablet (145 mg total) by mouth daily. (Patient not taking: Reported on 02/10/2017) 90 tablet 3  . gabapentin (NEURONTIN) 300 MG capsule Take 1 capsule (300 mg total) by mouth 3 (three) times daily. 90 capsule 3  . levETIRAcetam (KEPPRA) 500 MG tablet Take 1 tablet (500 mg total) by mouth 2 (two) times daily. 180 tablet 3  . Lurasidone HCl (LATUDA) 60 MG TABS Take 120 mg by mouth daily.     . meloxicam (MOBIC) 15 MG tablet Take 1 tablet (15 mg total) by mouth daily. 30 tablet 1  . nicotine (NICODERM CQ) 21 mg/24hr patch Place 1 patch (21 mg total) onto the skin daily. (Patient not taking: Reported on 10/28/2016) 28 patch 0  . pantoprazole (PROTONIX) 40 MG tablet Take 1 tablet (40 mg total) by mouth daily. 30 tablet 3  . QUEtiapine (SEROQUEL) 50 MG tablet Take 50 mg by mouth at bedtime.     . traZODone (DESYREL) 100 MG tablet Take 100 mg by mouth at bedtime.     . VOLTAREN 1 % GEL Apply 2 g topically 4 (four) times daily. 100 g 2   No current facility-administered medications on file prior to visit.     Allergies  Allergen Reactions  . Ibuprofen Other (See Comments)    Reaction:  Nose bleeds   . Fluzone [Flu Virus Vaccine] Other (See Comments)    Pt with  documented diagnosis of Guillain-Barre syndrome.    Social History   Social History  . Marital status: Single    Spouse name: N/A  . Number of children: N/A  . Years of education: N/A   Occupational History  . Not on file.   Social History Main Topics  . Smoking status: Current Every Day Smoker    Packs/day: 0.25    Types: Cigarettes  . Smokeless tobacco: Current User  . Alcohol use No     Comment: occasional   . Drug use: Yes    Types: Marijuana     Comment: pt smokes marijuana daily   . Sexual activity: Yes   Other Topics Concern  . Not on file   Social History Narrative  . No narrative on file    Family History  Problem Relation Age of Onset  . Cancer Mother   . Stroke Maternal Grandmother     Past Surgical History:  Procedure  Laterality Date  . Nasal Cauterization    . TONSILLECTOMY      ROS: Review of Systems Neg except as above  PHYSICAL EXAM: BP (!) 148/94   Pulse (!) 104   Temp 98.6 F (37 C) (Oral)   Resp 16   SpO2 97%   Repeat 120/80  Physical Exam General appearance - alert, well appearing, male in wheelchair. and in no distress and oriented to person, place, and time Mental status -pt answers questions appropriately Chest - clear to auscultation, no wheezes, rales or rhonchi, symmetric air entry Heart - normal rate, regular rhythm, normal S1, S2, no murmurs, rubs, clicks or gallops Ext: no LE edema  MRI thoracic spine: No likely significant lesion. Shallow disc protrusion at T3-4, similar to the previous exam. No neural compression.  ASSESSMENT AND PLAN: 1. 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  Stress importance of getting him in for P.T both for his back pain and hx of myopathy -I will check with our referral person about the pain clinic referral with Dr. Wyvonnia Lora - Ambulatory referral to Physical Therapy  2. Myopathy 3. Functional neurological symptom  disorder with mixed symptoms Before I can write a letter for him I requested that he follow up with his neurologist and complete the somatosensory evoked potential that was ordered by them. I would like to get a better understanding of his physical abilities since previous EMG, LP, brain MRIs were negative as reported. Also MRIs of spine without significant disease enough to explain diffuse numbness and weakness - Ambulatory referral to Physical Therapy  4. In regards to his questionable factor 5 Leiden deficiency I explained to him that it is a gene deficiency can cause blood clots in the body. However he is asymptomatic and even if we were to test him and he tested positive we would not treat with anticoagulant and less blood clot developed. Patient was given the opportunity to ask questions.  Patient verbalized understanding of the plan and was able to repeat key elements of the plan.   Orders Placed This Encounter  Procedures  . Ambulatory referral to Physical Therapy     Requested Prescriptions    No prescriptions requested or ordered in this encounter     Karle Plumber, MD, [facility]alita Chessman

## 2017-04-13 NOTE — Patient Instructions (Signed)
Please follow up with your neurologist for clarification of your diagnosis.  I have referred you for physical therapy and to see pain specialist.

## 2017-04-14 NOTE — Telephone Encounter (Signed)
Discussed with pt on visit yesterday.

## 2017-04-20 MED FILL — CYCLOBENZAPRINE 5 MG TABLET: 5 | 10 days supply | Qty: 30 | Fill #3

## 2017-04-20 MED FILL — ?CETIRIZINE HCL 10 MG TABLE: 10 | 30 days supply | Qty: 30 | Fill #1

## 2017-04-27 ENCOUNTER — Encounter: Payer: Self-pay | Admitting: Physical Medicine & Rehabilitation

## 2017-05-19 ENCOUNTER — Ambulatory Visit: Payer: Medicaid Other | Attending: Internal Medicine | Admitting: Internal Medicine

## 2017-05-19 ENCOUNTER — Encounter: Payer: Self-pay | Admitting: Internal Medicine

## 2017-05-19 VITALS — BP 149/102 | HR 90 | Temp 99.2°F | Resp 16

## 2017-05-19 DIAGNOSIS — I10 Essential (primary) hypertension: Secondary | ICD-10-CM

## 2017-05-19 DIAGNOSIS — M549 Dorsalgia, unspecified: Secondary | ICD-10-CM

## 2017-05-19 DIAGNOSIS — Z79899 Other long term (current) drug therapy: Secondary | ICD-10-CM | POA: Insufficient documentation

## 2017-05-19 DIAGNOSIS — G8929 Other chronic pain: Secondary | ICD-10-CM | POA: Diagnosis not present

## 2017-05-19 DIAGNOSIS — H9202 Otalgia, left ear: Secondary | ICD-10-CM | POA: Insufficient documentation

## 2017-05-19 DIAGNOSIS — F319 Bipolar disorder, unspecified: Secondary | ICD-10-CM | POA: Insufficient documentation

## 2017-05-19 MED ORDER — CYCLOBENZAPRINE HCL 5 MG PO TABS: 5.0000 mg | ORAL_TABLET | Freq: Three times a day (TID) | ORAL | 3 refills | Status: DC | PRN

## 2017-05-19 MED ORDER — AMOXICILLIN-POT CLAVULANATE 500-125 MG PO TABS: 1.0000 | ORAL_TABLET | Freq: Two times a day (BID) | ORAL | 0 refills | Status: DC

## 2017-05-19 NOTE — Patient Instructions (Signed)
Take the antibiotic as prescribed. Be seen in urgent care if anything gets worse over the weekend.

## 2017-05-19 NOTE — Progress Notes (Signed)
Patient ID: Donald Heck [name], male    DOB: Oct 11, 1990  MRN: [id number]  CC: Left ear pain  Subjective:  Donald Mckay is a 26 y.o. male who presents for UC visit. His concerns today include:   Blood pressure noted to be elevated today. Did not take Norvasc as yet for today  C/o pain in LT ear x 4 days. Tender in the preauricular area -Uses Q-tips in the ear to clean No decreased hearing. No drainage from the ears.  Request refill on Flexeril  Patient Active Problem List   Diagnosis Date Noted  . Bipolar disorder ([place]) 11/14/2016  . Gastritis and gastroduodenitis 03/14/2016  . Vitamin D deficiency 12/02/2015  . Chest pain 11/21/2015  . Seizures ([place]) 10/09/2015  . Recurrent tonsillitis 10/09/2015  . Obesity 10/09/2015  . Back pain 09/02/2015  . 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 Headache 07/09/2015  . Paresthesia 07/07/2015  . 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 GBS (Guillain Barre syndrome) ([place]) 05/24/2015  . Encounter for lumbar puncture   . Depression 05/15/2015  . Tobacco use disorder 05/12/2015     Current Outpatient Prescriptions on File Prior to Visit  Medication Sig Dispense Refill  . amLODipine (NORVASC) 5 MG tablet Take 1 tablet (5 mg total) by mouth daily. 90 tablet 3  . Blood Pressure KIT Check blood pressure as directed 1 each 0  . cetirizine (ZYRTEC) 10 MG tablet Take 1 tablet (10 mg total) by mouth daily. 30 tablet 3  . Cholecalciferol (VITAMIN D3) 5000 units CAPS Take 1 capsule (5,000 Units total) by mouth daily. 90 capsule 3  . fenofibrate (TRICOR) 145 MG tablet Take 1 tablet (145 mg total) by mouth daily. (Patient not taking: Reported on 02/10/2017) 90 tablet 3  . gabapentin (NEURONTIN) 300 MG capsule Take 1 capsule (300 mg total) by mouth 3 (three) times daily. 90 capsule 3  . levETIRAcetam (KEPPRA) 500 MG tablet Take 1 tablet (500 mg total) by mouth 2 (two) times daily. 180 tablet 3  . Lurasidone HCl (LATUDA) 60 MG TABS Take 120 mg by mouth daily.       . meloxicam (MOBIC) 15 MG tablet Take 1 tablet (15 mg total) by mouth daily. 30 tablet 1  . nicotine (NICODERM CQ) 21 mg/24hr patch Place 1 patch (21 mg total) onto the skin daily. (Patient not taking: Reported on 10/28/2016) 28 patch 0  . pantoprazole (PROTONIX) 40 MG tablet Take 1 tablet (40 mg total) by mouth daily. 30 tablet 3  . QUEtiapine (SEROQUEL) 50 MG tablet Take 50 mg by mouth at bedtime.     . traZODone (DESYREL) 100 MG tablet Take 100 mg by mouth at bedtime.     . VOLTAREN 1 % GEL Apply 2 g topically 4 (four) times daily. 100 g 2   No current facility-administered medications on file prior to visit.     Allergies  Allergen Reactions  . Ibuprofen Other (See Comments)    Reaction:  Nose bleeds   . Fluzone [Flu Virus Vaccine] Other (See Comments)    Pt with documented diagnosis of Guillain-Barre syndrome.     ROS: Review of Systems  Negative except as stated above PHYSICAL EXAM: BP (!) 149/102   Pulse 90   Temp 99.2 F (37.3 C) (Oral)   Resp 16   SpO2 98%   Physical Exam  General appearance - alert, well appearing, and in no distress Mental status - alert, oriented to person, place, and time, normal mood, behavior, speech, dress, motor activity,  and thought processes Ears - LT ear: no preauricular lymphadenopathy. Patient has small exquisitely tender lump at the opening to the canal. No pus expressed. Rest of the canal and tympanic membrane within normal limits    ASSESSMENT AND PLAN: 1. Left ear pain Due to inflamed bump or early abscess. Given Augmentin. Patient advised to be seen in urgent care or ER over the weekend if it does not improve with antibiotics  2. Other chronic back pain Refill Flexeril  3. Essential hypertension Patient take his blood pressure medicine when he returns home. Encourage compliance  Patient was given the opportunity to ask questions.  Patient verbalized understanding of the plan and was able to repeat key elements of the plan.    No orders of the defined types were placed in this encounter.    Requested Prescriptions   Signed Prescriptions Disp Refills  . cyclobenzaprine (FLEXERIL) 5 MG tablet 30 tablet 3    Sig: Take 1 tablet (5 mg total) by mouth 3 (three) times daily as needed for muscle spasms.  Marland Kitchen amoxicillin-clavulanate (AUGMENTIN) 500-125 MG tablet 14 tablet 0    Sig: Take 1 tablet (500 mg total) by mouth 2 (two) times daily.    Future Appointments Date Time Provider [place]  05/25/2017 2:20 PM Jamse Arn, MD [facility]    Karle Plumber, MD, FACP

## 2017-05-25 ENCOUNTER — Encounter: Payer: Self-pay | Attending: Physical Medicine & Rehabilitation | Admitting: Physical Medicine & Rehabilitation

## 2017-06-05 ENCOUNTER — Telehealth: Payer: Self-pay | Admitting: Internal Medicine

## 2017-06-05 DIAGNOSIS — K2970 Gastritis, unspecified, without bleeding: Secondary | ICD-10-CM

## 2017-06-05 DIAGNOSIS — K2990 Gastroduodenitis, unspecified, without bleeding: Secondary | ICD-10-CM

## 2017-06-05 DIAGNOSIS — I10 Essential (primary) hypertension: Secondary | ICD-10-CM

## 2017-06-05 NOTE — Telephone Encounter (Signed)
Pt called asking for refills on medication(protonix, gabapentin, flexeril and amlodipine). Pt would like it sent to [facility] pharmacy please fu

## 2017-06-06 MED ORDER — GABAPENTIN 300 MG PO CAPS: 300.0000 mg | ORAL_CAPSULE | Freq: Three times a day (TID) | ORAL | 2 refills | Status: DC

## 2017-06-06 MED ORDER — PANTOPRAZOLE SODIUM 40 MG PO TBEC: 40.0000 mg | DELAYED_RELEASE_TABLET | Freq: Every day | ORAL | 0 refills | Status: DC

## 2017-06-06 MED ORDER — AMLODIPINE BESYLATE 5 MG PO TABS: 5.0000 mg | ORAL_TABLET | Freq: Every day | ORAL | 0 refills | Status: DC

## 2017-06-06 MED FILL — AMLODIPINE BESYLATE 5 MG TA: 5 | 30 days supply | Qty: 30 | Fill #0

## 2017-06-06 MED FILL — ?PANTOPRAZOLE SOD DR 40MG: 40 MG | 30 days supply | Qty: 30 | Fill #0

## 2017-06-06 MED FILL — GABAPENTIN 300 MG CAPSULE: 300 | 30 days supply | Qty: 90 | Fill #0

## 2017-06-06 NOTE — Telephone Encounter (Signed)
Gabapentin and amlodipine refilled. Cyclobenzaprine is too early.

## 2017-06-06 NOTE — Addendum Note (Signed)
Addended by: Santa Lighter on: 06/06/2017 08:30 AM   Modules accepted: Orders

## 2017-06-06 NOTE — Telephone Encounter (Signed)
Pantoprazole refilled. 

## 2017-06-08 ENCOUNTER — Other Ambulatory Visit: Payer: Self-pay

## 2017-06-08 ENCOUNTER — Other Ambulatory Visit: Payer: Self-pay | Admitting: Internal Medicine

## 2017-06-08 MED ORDER — CYCLOBENZAPRINE HCL 5 MG PO TABS: 5.0000 mg | ORAL_TABLET | Freq: Three times a day (TID) | ORAL | 3 refills | Status: DC | PRN

## 2017-06-08 MED FILL — CYCLOBENZAPRINE 5 MG TABLET: 5 | 10 days supply | Qty: 30 | Fill #0

## 2017-06-17 ENCOUNTER — Emergency Department (HOSPITAL_COMMUNITY)
Admission: EM | Admit: 2017-06-17 | Discharge: 2017-06-17 | Disposition: A | Discharge status: Home / Self Care | Payer: Medicaid Other | Attending: Emergency Medicine | Admitting: Emergency Medicine

## 2017-06-17 ENCOUNTER — Emergency Department (HOSPITAL_COMMUNITY): Payer: Medicaid Other

## 2017-06-17 ENCOUNTER — Encounter (HOSPITAL_COMMUNITY): Payer: Self-pay | Admitting: Emergency Medicine

## 2017-06-17 DIAGNOSIS — Y999 Unspecified external cause status: Secondary | ICD-10-CM | POA: Diagnosis not present

## 2017-06-17 DIAGNOSIS — Y92410 Unspecified street and highway as the place of occurrence of the external cause: Secondary | ICD-10-CM | POA: Diagnosis not present

## 2017-06-17 DIAGNOSIS — R102 Pelvic and perineal pain: Secondary | ICD-10-CM | POA: Insufficient documentation

## 2017-06-17 DIAGNOSIS — S161XXA Strain of muscle, fascia and tendon at neck level, initial encounter: Secondary | ICD-10-CM | POA: Insufficient documentation

## 2017-06-17 DIAGNOSIS — F17210 Nicotine dependence, cigarettes, uncomplicated: Secondary | ICD-10-CM | POA: Diagnosis not present

## 2017-06-17 DIAGNOSIS — Z79899 Other long term (current) drug therapy: Secondary | ICD-10-CM | POA: Diagnosis not present

## 2017-06-17 DIAGNOSIS — S0101XA Laceration without foreign body of scalp, initial encounter: Secondary | ICD-10-CM | POA: Insufficient documentation

## 2017-06-17 DIAGNOSIS — S0990XA Unspecified injury of head, initial encounter: Secondary | ICD-10-CM

## 2017-06-17 DIAGNOSIS — Y9389 Activity, other specified: Secondary | ICD-10-CM | POA: Diagnosis not present

## 2017-06-17 MED ORDER — CYCLOBENZAPRINE HCL 10 MG PO TABS: 10.0000 mg | ORAL_TABLET | Freq: Two times a day (BID) | ORAL | 0 refills | Status: DC | PRN

## 2017-06-17 MED ORDER — MORPHINE SULFATE (PF) 4 MG/ML IV SOLN
4.0000 mg | Freq: Once | INTRAVENOUS | Status: AC
  Administered 2017-06-17: 4 mg via INTRAVENOUS
  Filled 2017-06-17: qty 1

## 2017-06-17 MED ORDER — IOPAMIDOL (ISOVUE-300) INJECTION 61%
INTRAVENOUS | Status: AC
  Administered 2017-06-17: 100 mL
  Filled 2017-06-17: qty 100

## 2017-06-17 MED ORDER — ONDANSETRON HCL 4 MG/2ML IJ SOLN
4.0000 mg | Freq: Once | INTRAMUSCULAR | Status: AC
Start: 2017-06-17 — End: 2017-06-17
  Administered 2017-06-17: 4 mg via INTRAVENOUS
  Filled 2017-06-17: qty 2

## 2017-06-17 MED ORDER — HYDROCODONE-ACETAMINOPHEN 5-325 MG PO TABS: 1.0000 | ORAL_TABLET | Freq: Four times a day (QID) | ORAL | 0 refills | Status: DC | PRN

## 2017-06-17 MED ORDER — LIDOCAINE-EPINEPHRINE (PF) 2 %-1:200000 IJ SOLN
10.0000 mL | Freq: Once | INTRAMUSCULAR | Status: AC
  Administered 2017-06-17: 10 mL via INTRADERMAL
  Filled 2017-06-17: qty 20

## 2017-06-17 MED ORDER — HYDROCODONE-ACETAMINOPHEN 5-325 MG PO TABS
1.0000 | ORAL_TABLET | Freq: Once | ORAL | Status: AC
  Administered 2017-06-17: 1 via ORAL
  Filled 2017-06-17: qty 1
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
## 2017-06-17 NOTE — Discharge Instructions (Signed)
Bacitracin twice a day. Take tylenol or motrin for pain. Flexeril for spasms. Ice your head. Follow up in 7-10 days for staple removal.

## 2017-06-17 NOTE — ED Notes (Signed)
Patient transported to CT scan/X-ray.

## 2017-06-22 ENCOUNTER — Telehealth: Payer: Self-pay | Admitting: Internal Medicine
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
## 2017-06-23 MED ORDER — ACETAMINOPHEN-CODEINE #3 300-30 MG PO TABS: 1.0000 | ORAL_TABLET | Freq: Three times a day (TID) | ORAL | 0 refills | Status: DC | PRN

## 2017-06-23 NOTE — Telephone Encounter (Signed)
Contacted pt and made aware of rx being up front
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
## 2017-06-26 ENCOUNTER — Encounter: Payer: Self-pay | Admitting: Internal Medicine

## 2017-06-26 ENCOUNTER — Ambulatory Visit: Payer: Medicaid Other | Attending: Internal Medicine | Admitting: Internal Medicine

## 2017-06-26 VITALS — BP 139/89 | HR 97 | Temp 98.9°F | Resp 16

## 2017-06-26 DIAGNOSIS — F319 Bipolar disorder, unspecified: Secondary | ICD-10-CM | POA: Insufficient documentation

## 2017-06-26 DIAGNOSIS — S0101XA Laceration without foreign body of scalp, initial encounter: Secondary | ICD-10-CM

## 2017-06-26 DIAGNOSIS — S134XXA Sprain of ligaments of cervical spine, initial encounter: Secondary | ICD-10-CM | POA: Insufficient documentation

## 2017-06-26 DIAGNOSIS — Z4802 Encounter for removal of sutures: Secondary | ICD-10-CM

## 2017-06-26 DIAGNOSIS — T148XXA Other injury of unspecified body region, initial encounter: Secondary | ICD-10-CM

## 2017-06-26 DIAGNOSIS — Z79899 Other long term (current) drug therapy: Secondary | ICD-10-CM | POA: Insufficient documentation

## 2017-06-26 DIAGNOSIS — F17210 Nicotine dependence, cigarettes, uncomplicated: Secondary | ICD-10-CM | POA: Insufficient documentation

## 2017-06-26 DIAGNOSIS — X58XXXA Exposure to other specified factors, initial encounter: Secondary | ICD-10-CM | POA: Insufficient documentation

## 2017-06-26 DIAGNOSIS — Z23 Encounter for immunization: Secondary | ICD-10-CM | POA: Insufficient documentation

## 2017-06-26 DIAGNOSIS — G44319 Acute post-traumatic headache, not intractable: Secondary | ICD-10-CM

## 2017-06-26 DIAGNOSIS — G610 Guillain-Barre syndrome: Secondary | ICD-10-CM | POA: Diagnosis not present

## 2017-06-26 DIAGNOSIS — R51 Headache: Secondary | ICD-10-CM | POA: Diagnosis not present

## 2017-06-26 MED ORDER — CYCLOBENZAPRINE HCL 10 MG PO TABS: 10.0000 mg | ORAL_TABLET | Freq: Three times a day (TID) | ORAL | 0 refills | Status: DC | PRN

## 2017-06-26 MED ORDER — NAPROXEN 500 MG PO TABS: 500.0000 mg | ORAL_TABLET | Freq: Two times a day (BID) | ORAL | 1 refills | Status: DC | PRN
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
## 2017-06-26 NOTE — Patient Instructions (Signed)
Hold Mobic while you take Naprosyn as needed.

## 2017-06-29 ENCOUNTER — Encounter: Payer: Self-pay | Admitting: Internal Medicine

## 2017-06-29 NOTE — Progress Notes (Signed)
Letter corrected and rewritten.

## 2017-07-06 ENCOUNTER — Other Ambulatory Visit: Payer: Self-pay

## 2017-07-06 DIAGNOSIS — K2970 Gastritis, unspecified, without bleeding: Secondary | ICD-10-CM

## 2017-07-06 DIAGNOSIS — K2990 Gastroduodenitis, unspecified, without bleeding: Principal | ICD-10-CM

## 2017-07-06 MED ORDER — PANTOPRAZOLE SODIUM 40 MG PO TBEC: 40.0000 mg | DELAYED_RELEASE_TABLET | Freq: Every day | ORAL | 0 refills | Status: DC

## 2017-07-13 ENCOUNTER — Ambulatory Visit: Payer: Self-pay

## 2017-07-28 ENCOUNTER — Encounter: Payer: Self-pay | Admitting: Internal Medicine

## 2017-07-28 ENCOUNTER — Ambulatory Visit: Payer: Medicaid Other | Attending: Internal Medicine | Admitting: Internal Medicine

## 2017-07-28 VITALS — BP 138/95 | HR 98 | Temp 99.6°F | Resp 16 | Wt 336.2 lb

## 2017-07-28 DIAGNOSIS — Z6841 Body Mass Index (BMI) 40.0 and over, adult: Secondary | ICD-10-CM | POA: Insufficient documentation

## 2017-07-28 DIAGNOSIS — F449 Dissociative and conversion disorder, unspecified: Secondary | ICD-10-CM | POA: Diagnosis not present

## 2017-07-28 DIAGNOSIS — G610 Guillain-Barre syndrome: Secondary | ICD-10-CM | POA: Diagnosis not present

## 2017-07-28 DIAGNOSIS — G40909 Epilepsy, unspecified, not intractable, without status epilepticus: Secondary | ICD-10-CM | POA: Insufficient documentation

## 2017-07-28 DIAGNOSIS — L905 Scar conditions and fibrosis of skin: Secondary | ICD-10-CM | POA: Diagnosis not present

## 2017-07-28 DIAGNOSIS — E669 Obesity, unspecified: Secondary | ICD-10-CM | POA: Diagnosis not present

## 2017-07-28 DIAGNOSIS — I10 Essential (primary) hypertension: Secondary | ICD-10-CM | POA: Diagnosis not present

## 2017-07-28 DIAGNOSIS — Z79899 Other long term (current) drug therapy: Secondary | ICD-10-CM | POA: Insufficient documentation

## 2017-07-28 DIAGNOSIS — F17210 Nicotine dependence, cigarettes, uncomplicated: Secondary | ICD-10-CM | POA: Insufficient documentation

## 2017-07-28 DIAGNOSIS — E559 Vitamin D deficiency, unspecified: Secondary | ICD-10-CM | POA: Insufficient documentation

## 2017-07-28 DIAGNOSIS — F319 Bipolar disorder, unspecified: Secondary | ICD-10-CM | POA: Insufficient documentation

## 2017-07-28 DIAGNOSIS — M549 Dorsalgia, unspecified: Secondary | ICD-10-CM | POA: Diagnosis not present

## 2017-07-28 DIAGNOSIS — F17200 Nicotine dependence, unspecified, uncomplicated: Secondary | ICD-10-CM | POA: Diagnosis not present

## 2017-07-28 DIAGNOSIS — G8929 Other chronic pain: Secondary | ICD-10-CM | POA: Insufficient documentation

## 2017-07-28 DIAGNOSIS — L309 Dermatitis, unspecified: Secondary | ICD-10-CM | POA: Insufficient documentation

## 2017-07-28 MED ORDER — MICONAZOLE NITRATE 2 % EX CREA: TOPICAL_CREAM | CUTANEOUS | 0 refills | Status: AC

## 2017-07-28 NOTE — Progress Notes (Signed)
**Note Donald-Identified via Obfuscation** Patient ID: Donald Mckay., male    DOB: 1991/09/16  MRN: [id number]  CC: Medical Clearance and Blister   Subjective: Donald Mckay is a 26 y.o. male who presents urgent care visit.  This with him His concerns today include:  Patient with history of ? residual myopathy associated with GBS, conversion disorder, tobacco dependence,chronic headaches, seizure disorder, HTN,vitamin D deficiency, bipolar disorder, obesity and chronic back pain.   1.  Patient presents today requesting a letter of release to return to work.  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  Patient also states that his lawyer recommended that I mention that the scar on his scalp is permanent and address the bump that is next to the scalp    2.  Complains of intermittent blisters on the sole of the left foot times 2 months.no initiating factors.  No itching.  . Blisters will resolve and leave dark spots   3. Tob: Smoking  5-7 cig/day. Trying to quit. Was at a pack a day -Trying to quit but feels that he is plateaued at 5-7 a day  4.  He reports that his appointment in [place] with the neurologist may be moved up to next month.  States that he has been in communication with them and they will call him back next week to let him know whether the appointment has been moved up  5.HTN: Compliant with amlodipine.  He limits salt in the foods.  We went over her med list.  He is not taking Latuda or Seroquel. Patient Active Problem List   Diagnosis Date Noted  . Bipolar disorder ([place]) 11/14/2016  . Gastritis and gastroduodenitis 03/14/2016  . Vitamin D deficiency 12/02/2015  . Seizures ([place]) 10/09/2015  . Obesity 10/09/2015  . Back pain 09/02/2015  . 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 Headache 07/09/2015  . Paresthesia 07/07/2015  . 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 GBS (Guillain Barre syndrome) ([place]) 05/24/2015  . Depression 05/15/2015  . Tobacco use disorder 05/12/2015     Current  Outpatient Medications on File Prior to Visit  Medication Sig Dispense Refill  . acetaminophen-codeine (TYLENOL #3) 300-30 MG tablet Take 1 tablet by mouth every 8 (eight) hours as needed for moderate pain. 14 tablet 0  . amLODipine (NORVASC) 5 MG tablet Take 1 tablet (5 mg total) by mouth daily. 90 tablet 0  . amoxicillin-clavulanate (AUGMENTIN) 500-125 MG tablet Take 1 tablet (500 mg total) by mouth 2 (two) times daily. 14 tablet 0  . Blood Pressure KIT Check blood pressure as directed 1 each 0  . cetirizine (ZYRTEC) 10 MG tablet Take 1 tablet (10 mg total) by mouth daily. 30 tablet 3  . Cholecalciferol (VITAMIN D3) 5000 units CAPS Take 1 capsule (5,000 Units total) by mouth daily. 90 capsule 3  . cyclobenzaprine (FLEXERIL) 10 MG tablet Take 1 tablet (10 mg total) by mouth 3 (three) times daily as needed for muscle spasms. 30 tablet 0  . fenofibrate (TRICOR) 145 MG tablet Take 1 tablet (145 mg total) by mouth daily. (Patient not taking: Reported on 02/10/2017) 90 tablet 3  . gabapentin (NEURONTIN) 300 MG capsule Take 1 capsule (300 mg total) by mouth 3 (three) times daily. 90 capsule 2  . HYDROcodone-acetaminophen (NORCO) 5-325 MG tablet Take 1 tablet by mouth every 6 (six) hours as needed for moderate pain. 15 tablet 0  . levETIRAcetam (KEPPRA) 500 MG tablet Take 1 tablet (500 mg total) by mouth 2 (two)  times daily. 180 tablet 3  . Lurasidone HCl (LATUDA) 60 MG TABS Take 120 mg by mouth daily.     . meloxicam (MOBIC) 15 MG tablet Take 1 tablet (15 mg total) by mouth daily. 30 tablet 1  . naproxen (NAPROSYN) 500 MG tablet Take 1 tablet (500 mg total) by mouth 2 (two) times daily as needed. 40 tablet 1  . nicotine (NICODERM CQ) 21 mg/24hr patch Place 1 patch (21 mg total) onto the skin daily. (Patient not taking: Reported on 10/28/2016) 28 patch 0  . pantoprazole (PROTONIX) 40 MG tablet Take 1 tablet (40 mg total) by mouth daily. 90 tablet 0  . QUEtiapine (SEROQUEL) 50 MG tablet Take 50 mg by mouth  at bedtime.     . traZODone (DESYREL) 100 MG tablet Take 100 mg by mouth at bedtime.     . VOLTAREN 1 % GEL Apply 2 g topically 4 (four) times daily. 100 g 2   No current facility-administered medications on file prior to visit.     Allergies  Allergen Reactions  . Ibuprofen Other (See Comments)    Reaction:  Nose bleeds   . Fluzone [Flu Virus Vaccine] Other (See Comments)    Pt with documented diagnosis of Guillain-Barre syndrome.    Social History   Socioeconomic History  . Marital status: Single    Spouse name: Not on file  . Number of children: Not on file  . Years of education: Not on file  . Highest education level: Not on file  Social Needs  . Financial resource strain: Not on file  . Food insecurity - worry: Not on file  . Food insecurity - inability: Not on file  . Transportation needs - medical: Not on file  . Transportation needs - non-medical: Not on file  Occupational History  . Not on file  Tobacco Use  . Smoking status: Current Every Day Smoker    Packs/day: 0.25    Types: Cigarettes  . Smokeless tobacco: Current User  Substance and Sexual Activity  . Alcohol use: No    Comment: occasional   . Drug use: Yes    Types: Marijuana    Comment: pt smokes marijuana daily   . Sexual activity: Yes  Other Topics Concern  . Not on file  Social History Narrative  . Not on file    Family History  Problem Relation Age of Onset  . Cancer Mother   . Stroke Maternal Grandmother     Past Surgical History:  Procedure Laterality Date  . Nasal Cauterization    . TONSILLECTOMY      ROS: Review of Systems Negative except as stated above PHYSICAL EXAM: BP (!) 138/95   Pulse 98   Temp 99.6 F (37.6 C) (Oral)   Resp 16   Wt (!) 336 lb 3.2 oz (152.5 kg)   SpO2 96%   BMI 44.36 kg/m   Repeat blood pressure 1 1 2/80 Physical Exam General appearance - alert, well appearing, and in no distress.  Patient sitting in wheelchair Mental status - normal mood,  behavior, speech, dress, motor activity, and thought processes Chest - clear to auscultation, no wheezes, rales or rhonchi, symmetric air entry Heart - normal rate, regular rhythm, normal S1, S2, no murmurs, rubs, clicks or gallops Extremities -no lower extremity edema  skin -left foot: No blisters seen at this time.  However he shows me 3 slightly hyperpigmented spots with blisters were before.  No signs of tinea pedis between the toes  Scalp: About a 4-5 cm linear scar on crown a little to the right of the midline is well-healed.  Edges on the right side of the scar feels slightly nodular.  No tenderness  ASSESSMENT AND PLAN: 1. Essential hypertension Controlled.  Continue amlodipine.  2. Dermatitis of foot Questionable etiology.  Possible intermittent tinea.  We will try him with antifungal cream  3. Scar of scalp -Site of previous laceration appears well-healed.  Patient informed that the scar may shrink in size over time.  4. Tobacco dependence Smoking cessation encouraged.  He would like to try nicotine patches.  Patient told to call [phone] now and request the patches.  Patient was given the opportunity to ask questions.  Patient verbalized understanding of the plan and was able to repeat key elements of the plan.   No orders of the defined types were placed in this encounter.    Requested Prescriptions    No prescriptions requested or ordered in this encounter    No Follow-up on file.  Karle Plumber, MD, FACP

## 2017-07-28 NOTE — Patient Instructions (Signed)
Call [phone] Now and request the nicotine patches.

## 2017-08-09 ENCOUNTER — Other Ambulatory Visit: Payer: Self-pay | Admitting: Internal Medicine

## 2017-08-09 DIAGNOSIS — T148XXA Other injury of unspecified body region, initial encounter: Secondary | ICD-10-CM

## 2017-08-09 DIAGNOSIS — K2990 Gastroduodenitis, unspecified, without bleeding: Secondary | ICD-10-CM

## 2017-08-09 DIAGNOSIS — K2970 Gastritis, unspecified, without bleeding: Secondary | ICD-10-CM

## 2017-08-09 MED ORDER — CYCLOBENZAPRINE HCL 10 MG PO TABS: 10.0000 mg | ORAL_TABLET | Freq: Three times a day (TID) | ORAL | 0 refills | Status: DC | PRN

## 2017-08-09 MED ORDER — PANTOPRAZOLE SODIUM 40 MG PO TBEC: 40.0000 mg | DELAYED_RELEASE_TABLET | Freq: Every day | ORAL | 0 refills | Status: DC

## 2017-09-21 ENCOUNTER — Other Ambulatory Visit: Payer: Self-pay

## 2017-09-21 ENCOUNTER — Telehealth: Payer: Self-pay

## 2017-09-21 DIAGNOSIS — T148XXA Other injury of unspecified body region, initial encounter: Secondary | ICD-10-CM

## 2017-09-21 DIAGNOSIS — K2990 Gastroduodenitis, unspecified, without bleeding: Principal | ICD-10-CM

## 2017-09-21 DIAGNOSIS — K2970 Gastritis, unspecified, without bleeding: Secondary | ICD-10-CM

## 2017-09-21 MED ORDER — CYCLOBENZAPRINE HCL 10 MG PO TABS: 10.0000 mg | ORAL_TABLET | Freq: Three times a day (TID) | ORAL | 0 refills | Status: DC | PRN

## 2017-09-21 MED ORDER — GABAPENTIN 300 MG PO CAPS: 300.0000 mg | ORAL_CAPSULE | Freq: Three times a day (TID) | ORAL | 2 refills | Status: DC

## 2017-09-21 MED ORDER — PANTOPRAZOLE SODIUM 40 MG PO TBEC: 40.0000 mg | DELAYED_RELEASE_TABLET | Freq: Every day | ORAL | 2 refills | Status: AC

## 2017-09-21 NOTE — Telephone Encounter (Signed)
Pt called and wants a refill on (FLEXERIL) 10 MG tablet ,(NEURONTIN) 300 MG ,(PROTONIX) 40 MG tablet he [facility] medications [facility] on [address] in [place].

## 2017-10-20 ENCOUNTER — Telehealth: Payer: Self-pay | Admitting: Internal Medicine

## 2017-10-20 DIAGNOSIS — T148XXA Other injury of unspecified body region, initial encounter: Secondary | ICD-10-CM

## 2017-10-20 MED ORDER — CYCLOBENZAPRINE HCL 10 MG PO TABS: 10.0000 mg | ORAL_TABLET | Freq: Three times a day (TID) | ORAL | 0 refills | Status: DC | PRN

## 2017-10-20 NOTE — Telephone Encounter (Signed)
Refilled x 30 days. 

## 2017-10-20 NOTE — Telephone Encounter (Signed)
Pt. Called requesting a refill cyclobenzaprine (FLEXERIL) 10 MG tablet  [facility] [place]. Please f/u

## 2017-11-02 ENCOUNTER — Other Ambulatory Visit: Payer: Self-pay | Admitting: Internal Medicine

## 2017-11-02 DIAGNOSIS — T148XXA Other injury of unspecified body region, initial encounter: Secondary | ICD-10-CM

## 2017-11-03 ENCOUNTER — Other Ambulatory Visit: Payer: Self-pay | Admitting: Internal Medicine

## 2017-11-03 ENCOUNTER — Telehealth: Payer: Self-pay | Admitting: Internal Medicine

## 2017-11-03 DIAGNOSIS — T148XXA Other injury of unspecified body region, initial encounter: Secondary | ICD-10-CM

## 2017-11-03 NOTE — Telephone Encounter (Signed)
Pt called need to speak with the nurse, he has some issue with the head and need some advice, please follow up

## 2017-11-07 NOTE — Telephone Encounter (Signed)
Pt states the last couple of days the skin on his head is shrinking pt states it feels tight where his scar is where he his stiches. Pt states the feeling is weird and doesn't know how to really explain it.   Pt states he has a letter from disability needing his pcp to fill out if possible. The letter contains how long patient can walk and stand for and how much he can lift. Pt states he is trying to get disbaility and the disbaility office stated if pcp can fill out the form then it will make the process quicker but if not then it will be okay  Pt is needing his Cyclobenzaprine refilled and sent to [facility] [place].

## 2017-11-08 MED ORDER — CYCLOBENZAPRINE HCL 5 MG PO TABS: 5.0000 mg | ORAL_TABLET | Freq: Three times a day (TID) | ORAL | 2 refills | Status: DC | PRN

## 2017-11-09 NOTE — Telephone Encounter (Signed)
Contacted pt and made him aware. Pt states he will be getting his notes for neuro and pt faxed to the office for Dr. Laural BenesJohnson review

## 2017-11-22 ENCOUNTER — Telehealth: Payer: Self-pay | Admitting: Internal Medicine

## 2017-11-22 NOTE — Telephone Encounter (Signed)
[facility] called Donald Mckay(Jeffrey) to report that Pt had his BP at 150/107 this was at 11:59am, he is asking for nursing evaluation management  If you have any question to please call him at [phone] Donald Mckay(Jeffrey), please follow up

## 2017-11-22 NOTE — Telephone Encounter (Signed)
Per pt: He has amlodipine and normally takes this medication everyday at night. He denies chest pain, SOB or HA. Pt states he doesn't feel different from any other days. States he feel fine.

## 2017-11-24 NOTE — Telephone Encounter (Signed)
Unable to reach patient.  Busy signal.

## 2017-11-27 NOTE — Telephone Encounter (Signed)
Pt aware of message per Dr. Laural BenesJohnson recommendations for BP and amlodipine. Pt aware incase need for adjustment at next rehab appointment.  Pt verbalized understanding.

## 2017-11-28 ENCOUNTER — Telehealth: Payer: Self-pay | Admitting: Internal Medicine

## 2017-11-28 DIAGNOSIS — I10 Essential (primary) hypertension: Secondary | ICD-10-CM

## 2017-11-28 MED ORDER — AMLODIPINE BESYLATE 10 MG PO TABS: 5.0000 mg | ORAL_TABLET | Freq: Every day | ORAL | 3 refills | Status: DC

## 2017-11-28 NOTE — Telephone Encounter (Signed)
Physical Therapy Trey Paula(Jeff) call to speak with the nurse or the pcp about the med for the Pt, please call him back at [phone]

## 2017-11-28 NOTE — Telephone Encounter (Signed)
Donald Mckay, PT called to inform pt BP was a 130/100. He reports diastolic being elevated. Aware of instructions from Dr. Laural BenesJohnson.  Spoke to patient and he states that PT took BP after rehab. He states he didn't want to tell him to take prior to therapy because he  Felt that it would be rude. Pt encourage to take blood pressure at home for he has a means of taking BP at home and to call with reading on Thursday and to hold off on increasing dose of Amlodipine at this time. Next home visit from Donald Mckay is next week. He will also have nursing come out for medication management.

## 2017-11-30 ENCOUNTER — Telehealth: Payer: Self-pay | Admitting: Internal Medicine

## 2017-11-30 NOTE — Telephone Encounter (Signed)
2 page, paperwork was received through fax 11-30-17. (Raised toilet seat and bath chair)

## 2017-12-01 ENCOUNTER — Other Ambulatory Visit: Payer: Self-pay | Admitting: *Deleted

## 2017-12-01 MED ORDER — AMLODIPINE BESYLATE 10 MG PO TABS: 10.0000 mg | ORAL_TABLET | Freq: Every day | ORAL | 0 refills | Status: DC

## 2017-12-01 NOTE — Progress Notes (Signed)
Pt states his blood pressure readings are 143/85 and 140/92. Pt was advised to begin amlodipine 10 mg. He requested medication be [facility] [place]. Pt verbalized understanding.

## 2017-12-08 ENCOUNTER — Telehealth: Payer: Self-pay | Admitting: Internal Medicine

## 2017-12-14 ENCOUNTER — Telehealth: Payer: Self-pay | Admitting: Internal Medicine

## 2017-12-14 NOTE — Telephone Encounter (Signed)
3 page, paperwork received through fax 12-14-17.

## 2017-12-15 ENCOUNTER — Telehealth: Payer: Self-pay | Admitting: Internal Medicine

## 2017-12-15 NOTE — Telephone Encounter (Signed)
Page, paperwork received through fax 12-15-17.

## 2017-12-22 ENCOUNTER — Telehealth: Payer: Self-pay | Admitting: Internal Medicine

## 2017-12-22 NOTE — Telephone Encounter (Signed)
5 page, paperwork received through fax 12-22-17.
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
## 2018-01-31 ENCOUNTER — Encounter: Payer: Self-pay | Admitting: Physician Assistant

## 2018-02-12 ENCOUNTER — Encounter: Payer: Self-pay | Admitting: Internal Medicine

## 2018-02-13 ENCOUNTER — Encounter: Payer: Self-pay | Admitting: Internal Medicine

## 2018-02-16 ENCOUNTER — Encounter: Payer: Self-pay | Admitting: Internal Medicine

## 2018-02-22 ENCOUNTER — Encounter: Payer: Self-pay | Admitting: Internal Medicine

## 2018-02-22 ENCOUNTER — Telehealth: Payer: Self-pay | Admitting: Internal Medicine

## 2018-02-22 NOTE — Telephone Encounter (Signed)
Called patient to schedule appt. Patient has scheduled an appt. For 03/01/18 with Georgian CoAngela Mcclung. Patient also stated that paperwork was faxed about 1 month ago so that he could get a bath chair and he has not received anything yet. Please f/u

## 2018-03-01 ENCOUNTER — Ambulatory Visit: Payer: Self-pay

## 2018-03-25 ENCOUNTER — Encounter: Payer: Self-pay | Admitting: Internal Medicine

## 2018-03-29 ENCOUNTER — Other Ambulatory Visit: Payer: Self-pay

## 2018-03-29 DIAGNOSIS — T148XXA Other injury of unspecified body region, initial encounter: Secondary | ICD-10-CM

## 2018-03-29 MED ORDER — CYCLOBENZAPRINE HCL 5 MG PO TABS
5.0000 mg | ORAL_TABLET | Freq: Three times a day (TID) | ORAL | 2 refills | Status: DC | PRN
Start: 2018-03-29 — End: 2018-07-02

## 2018-05-30 ENCOUNTER — Other Ambulatory Visit: Payer: Self-pay | Admitting: Internal Medicine

## 2018-07-02 ENCOUNTER — Other Ambulatory Visit: Payer: Self-pay | Admitting: Internal Medicine

## 2018-07-02 DIAGNOSIS — T148XXA Other injury of unspecified body region, initial encounter: Secondary | ICD-10-CM

## 2018-07-02 DIAGNOSIS — I10 Essential (primary) hypertension: Secondary | ICD-10-CM

## 2018-07-02 MED ORDER — AMLODIPINE BESYLATE 10 MG PO TABS: 10.0000 mg | ORAL_TABLET | Freq: Every day | ORAL | 0 refills | Status: AC

## 2018-07-02 MED ORDER — GABAPENTIN 300 MG PO CAPS: 300.0000 mg | ORAL_CAPSULE | Freq: Three times a day (TID) | ORAL | 0 refills | Status: AC

## 2018-07-02 MED ORDER — CYCLOBENZAPRINE HCL 5 MG PO TABS: 5.0000 mg | ORAL_TABLET | Freq: Three times a day (TID) | ORAL | 0 refills | Status: AC | PRN

## 2018-07-02 NOTE — Telephone Encounter (Signed)
Could you contact pt to see what medications his needing a referral on and send it to [facility]

## 2018-07-02 NOTE — Telephone Encounter (Signed)
1) Medication(s) Requested (by name): Gabapentin Flexeril alodopine 2) Pharmacy of Choice: [facility] in [place] on [address] 3) Special Requests:   Approved medications will be sent to the pharmacy, we will reach out if there is an issue.  Requests made after 3pm may not be addressed until the following business day!  If a patient is unsure of the name of the medication(s) please note and ask patient to call back when they are able to provide all info, do not send to responsible party until all information is available!

## 2018-07-02 NOTE — Telephone Encounter (Signed)
Patient would like for nurse to follow up due to him needing refills for his prescription. Patient has been told that he cannot get his medication refilled until he follows up with PCP. Please follow up.

## 2018-07-03 MED FILL — CYCLOBENZAPRINE 5 MG TABLET: 5 | 30 days supply | Qty: 60 | Fill #0 | Status: TO

## 2018-07-03 MED FILL — AMLODIPINE BESYLATE 10 MG T: 10 | 30 days supply | Qty: 30 | Fill #0 | Status: TO

## 2018-07-03 MED FILL — GABAPENTIN 300 MG CAPSULE: 300 | 30 days supply | Qty: 90 | Fill #0 | Status: TO

## 2018-07-10 ENCOUNTER — Other Ambulatory Visit: Payer: Self-pay | Admitting: Internal Medicine

## 2018-07-10 NOTE — Telephone Encounter (Signed)
keppra 500 mg  [facility] in [place] on [address].

## 2018-07-13 NOTE — Telephone Encounter (Signed)
The pharmacy the patient is requesting the prescription at has never filled this medication for the him, [facility] shows that he last picked up Keppra on 12/06/2016, I reviewed his care everywhere and cannot see where anyone else prescribed this medication either. I attempted to call the patient but there was no answer.

## 2018-07-16 NOTE — Telephone Encounter (Signed)
Margaret could you please contact pt  

## 2018-07-17 NOTE — Telephone Encounter (Signed)
I was able to reach the patient who says that he has been taking Keppra as directed and only recently started running low, he stated he thought he had picked it up with [facility] in [place]. I also asked who had written the RX for him since [facility] has no record of it being written since Dr. Julien Nordmann, he stated he thought he had gotten it with a [facility] doc.   I called [facility] [place] and they informed me that [facility]ra for this patient at any location in the last few years. I saw that [facility] was also one of his pharmacies and they reported they have not filled that medication since march 2018. Based on the information I have it seems that the patient has not refilled this prescription in over a year, if in fact he has been taking it I am not sure where he filled it or who wrote the prescription.

## 2018-07-18 MED ORDER — LEVETIRACETAM 500 MG PO TABS: 500.0000 mg | ORAL_TABLET | Freq: Two times a day (BID) | ORAL | 0 refills | Status: AC

## 2018-07-18 NOTE — Addendum Note (Signed)
Addended by: Jonah Blue B on: 07/18/2018 01:39 PM   Modules accepted: Orders

## 2018-07-20 ENCOUNTER — Ambulatory Visit: Payer: Self-pay | Admitting: Internal Medicine

## 2018-11-11 ENCOUNTER — Encounter (HOSPITAL_COMMUNITY): Payer: Self-pay

## 2018-11-11 ENCOUNTER — Other Ambulatory Visit: Payer: Self-pay

## 2018-11-11 ENCOUNTER — Emergency Department (HOSPITAL_COMMUNITY)
Admission: EM | Admit: 2018-11-11 | Discharge: 2018-11-11 | Disposition: A | Discharge status: Home / Self Care | Payer: Medicaid Other | Attending: Emergency Medicine | Admitting: Emergency Medicine

## 2018-11-11 DIAGNOSIS — Z720 Tobacco use: Secondary | ICD-10-CM | POA: Insufficient documentation

## 2018-11-11 DIAGNOSIS — R05 Cough: Secondary | ICD-10-CM | POA: Diagnosis present

## 2018-11-11 DIAGNOSIS — J111 Influenza due to unidentified influenza virus with other respiratory manifestations: Secondary | ICD-10-CM | POA: Diagnosis not present

## 2018-11-11 DIAGNOSIS — Z79899 Other long term (current) drug therapy: Secondary | ICD-10-CM | POA: Insufficient documentation

## 2018-11-11 DIAGNOSIS — R6889 Other general symptoms and signs: Secondary | ICD-10-CM

## 2018-11-11 DIAGNOSIS — R0981 Nasal congestion: Secondary | ICD-10-CM | POA: Insufficient documentation

## 2018-11-11 MED ORDER — ONDANSETRON 4 MG PO TBDP: 4.0000 mg | ORAL_TABLET | Freq: Three times a day (TID) | ORAL | 0 refills | Status: AC | PRN

## 2018-11-11 MED ORDER — BENZONATATE 100 MG PO CAPS
100.0000 mg | ORAL_CAPSULE | Freq: Once | ORAL | Status: AC
  Administered 2018-11-11: 100 mg via ORAL
  Filled 2018-11-11: qty 1

## 2018-11-11 MED ORDER — OSELTAMIVIR PHOSPHATE 75 MG PO CAPS: 75.0000 mg | ORAL_CAPSULE | Freq: Two times a day (BID) | ORAL | 0 refills | Status: AC

## 2018-11-11 MED ORDER — ACETAMINOPHEN 500 MG PO TABS
1000.0000 mg | ORAL_TABLET | Freq: Once | ORAL | Status: AC
  Administered 2018-11-11: 1000 mg via ORAL
  Filled 2018-11-11: qty 2

## 2018-11-11 MED ORDER — BENZONATATE 100 MG PO CAPS
ORAL_CAPSULE | ORAL | Status: AC
  Filled 2018-11-11: qty 1

## 2018-11-11 MED ORDER — ONDANSETRON 4 MG PO TBDP
4.0000 mg | ORAL_TABLET | Freq: Once | ORAL | Status: AC
  Administered 2018-11-11: 4 mg via ORAL
  Filled 2018-11-11: qty 1

## 2018-11-11 MED ORDER — BENZONATATE 100 MG PO CAPS: 100.0000 mg | ORAL_CAPSULE | Freq: Three times a day (TID) | ORAL | 0 refills | Status: AC

## 2018-11-11 NOTE — Discharge Instructions (Signed)
Take the prescribed medication as directed.  Continue tylenol for fever. Push oral fluids, rest as much as you can. Follow-up with your primary care doctor. Return to the ED for new or worsening symptoms.

## 2018-11-11 NOTE — ED Triage Notes (Signed)
Pt reports cough, nausea, nasal congestion, headache, and chills starting yesterday.

## 2018-11-11 NOTE — ED Provider Notes (Signed)
[facility] DEPT Provider Note   CSN: [id number] Arrival date & time: 11/11/18  0003    History   Chief Complaint Chief Complaint  Patient presents with  . Cough  . Nasal Congestion    HPI [facility]. is a 28 y.o. male.     The history is provided by the patient and medical records.  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  Began yesterday upon waking.  He reports cough, fever, chills, nasal congestion, sore throat, nausea, vomiting, and diarrhea.  Has had fevers at home.  Friend who he is close with was sick with flu recently.  Patient denies any chest pain or SOB.  No abdominal pain.  He did try OTC mucinex without relief.  Patient cannot receive flu vaccines due to history of suspected GB.  Past Medical History:  Diagnosis Date  . Anxiety   . Depression 2012   History of self injury.  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 Seizures [facility])     Patient Active Problem List   Diagnosis Date Noted  . Bipolar disorder ([place]) 11/14/2016  . Gastritis and gastroduodenitis 03/14/2016  . Vitamin D deficiency 12/02/2015  . Seizures ([place]) 10/09/2015  . Obesity 10/09/2015  . Back pain 09/02/2015  . 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 Headache 07/09/2015  . Paresthesia 07/07/2015  . 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 GBS (Guillain Barre syndrome) ([place]) 05/24/2015  . Depression 05/15/2015  . Tobacco use disorder 05/12/2015    Past Surgical History:  Procedure Laterality Date  . Nasal Cauterization    . TONSILLECTOMY          Home Medications    Prior to Admission medications   Medication Sig Start Date End Date Taking? Authorizing Provider  amLODipine (NORVASC) 10 MG tablet Take 1 tablet  (10 mg total) by mouth daily. 07/02/18   Ladell Pier, MD  Blood Pressure KIT Check blood pressure as directed 11/04/16   Tresa Garter, MD  cetirizine (ZYRTEC) 10 MG tablet Take 1 tablet (10 mg total) by mouth daily. 01/25/17   Maren Reamer, MD  Cholecalciferol (VITAMIN D3) 5000 units CAPS Take 1 capsule (5,000 Units total) by mouth daily. 12/07/16   Maren Reamer, MD  cyclobenzaprine (FLEXERIL) 5 MG tablet Take 1 tablet (5 mg total) by mouth 3 (three) times daily as needed for muscle spasms. Each prescription to last 1 month 07/02/18   Ladell Pier, MD  fenofibrate (TRICOR) 145 MG tablet Take 1 tablet (145 mg total) by mouth daily. Patient not taking: Reported on 02/10/2017 12/19/16 12/19/17  Maren Reamer, MD  gabapentin (NEURONTIN) 300 MG capsule Take 1 capsule (300 mg total) by mouth 3 (three) times daily. 07/02/18   Ladell Pier, MD  levETIRAcetam (KEPPRA) 500 MG tablet Take 1 tablet (500 mg total) by mouth 2 (two) times daily. 12/07/16   Maren Reamer, MD  levETIRAcetam (KEPPRA) 500 MG tablet Take 1 tablet (500 mg total) by mouth 2 (two) times daily. 07/18/18   Ladell Pier, MD  meloxicam (MOBIC) 15 MG tablet Take 1 tablet (15 mg total) by mouth daily. 03/20/17   Ladell Pier, MD  miconazole (MICOTIN) 2 % cream Apply to affected area twice a  day 07/28/17   Ladell Pier, MD  naproxen (NAPROSYN) 500 MG tablet Take 1 tablet (500 mg total) by mouth 2 (two) times daily as needed. 06/26/17   Ladell Pier, MD  pantoprazole (PROTONIX) 40 MG tablet Take 1 tablet (40 mg total) by mouth daily. 09/21/17   Ladell Pier, MD  traZODone (DESYREL) 100 MG tablet Take 100 mg by mouth at bedtime.     [provider]  VOLTAREN 1 % GEL Apply 2 g topically 4 (four) times daily. 02/10/17   Ladell Pier, MD    Family History Family History  Problem Relation Age of Onset  . Cancer Mother   . Stroke Maternal Grandmother     Social  History Social History   Tobacco Use  . Smoking status: Current Every Day Smoker    Packs/day: 0.25    Types: Cigarettes  . Smokeless tobacco: Current User  Substance Use Topics  . Alcohol use: No    Comment: occasional   . Drug use: Yes    Types: Marijuana    Comment: pt smokes marijuana daily      Allergies   Ibuprofen and Fluzone [flu virus vaccine]   Review of Systems Review of Systems  Constitutional: Positive for chills, fatigue and fever.  HENT: Positive for congestion and sore throat.   Respiratory: Positive for cough.   Gastrointestinal: Positive for diarrhea, nausea and vomiting.  All other systems reviewed and are negative.    Physical Exam Updated Vital Signs BP 138/88 (BP Location: Left Arm)   Pulse 100   Temp 99.9 F (37.7 C) (Oral)   Resp (!) 22   Ht 6' 1"  (1.854 m)   Wt 130.6 kg   SpO2 96%   BMI 38.00 kg/m   Physical Exam Vitals signs and nursing note reviewed.  Constitutional:      Appearance: He is well-developed.     Comments: Appears unwell but non-toxic  HENT:     Head: Normocephalic and atraumatic.     Right Ear: Tympanic membrane and ear canal normal.     Left Ear: Tympanic membrane and ear canal normal.     Nose: Congestion and rhinorrhea present. Rhinorrhea is clear.     Mouth/Throat:     Lips: Pink.     Pharynx: Oropharynx is clear. No pharyngeal swelling or posterior oropharyngeal erythema.  Eyes:     Conjunctiva/sclera: Conjunctivae normal.     Pupils: Pupils are equal, round, and reactive to light.  Neck:     Musculoskeletal: Normal range of motion.  Cardiovascular:     Rate and Rhythm: Normal rate and regular rhythm.     Heart sounds: Normal heart sounds.  Pulmonary:     Effort: Pulmonary effort is normal.     Breath sounds: Normal breath sounds. No decreased breath sounds, wheezing or rhonchi.     Comments: Repetitive, dry, hacking cough during exam, lungs are clear Abdominal:     General: Bowel sounds are normal.      Palpations: Abdomen is soft.  Musculoskeletal: Normal range of motion.  Skin:    General: Skin is warm and dry.  Neurological:     Mental Status: He is alert and oriented to person, place, and time.      ED Treatments / Results  Labs (all labs ordered are listed, but only abnormal results are displayed) Labs Reviewed - No data to display  EKG None  Radiology No results found.  Procedures Procedures (including critical care time)  Medications Ordered in ED Medications  benzonatate (TESSALON) capsule 100 mg (100 mg Oral Given 11/11/18 0352)  acetaminophen (TYLENOL) tablet 1,000 mg (1,000 mg Oral Given 11/11/18 0352)  ondansetron (ZOFRAN-ODT) disintegrating tablet 4 mg (4 mg Oral Given 11/11/18 0352)     Initial Impression / Assessment and Plan / ED Course  I have reviewed the triage vital signs and the nursing notes.  Pertinent labs & imaging results that were available during my care of the patient were reviewed by me and considered in my medical decision making (see chart for details).  28 year old male who with flulike symptoms upon waking yesterday.  Has low-grade fever here and appears unwell but is overall nontoxic in appearance.  Exam is overall benign aside from repetitive, dry, hacking cough.  Lungs are clear without any wheezes or rhonchi.  Has had positive flu contacts and unable to get flu vaccine due to suspected Bayard Males in the past.  Suspicion for influenza.  Discussed risk versus benefits of initiating Tamiflu as he is within the 48-hour window, patient elected to proceed with this.  Plan for this as well as other symptomatic treatment, encouraged good oral hydration and rest.  Close follow-up with PCP.  Return here for any new or worsening symptoms.  Final Clinical Impressions(s) / ED Diagnoses   Final diagnoses:  Flu-like symptoms    ED Discharge Orders         Ordered    oseltamivir (TAMIFLU) 75 MG capsule  Every 12 hours     11/11/18 0357     ondansetron (ZOFRAN ODT) 4 MG disintegrating tablet  Every 8 hours PRN     11/11/18 0357    benzonatate (TESSALON) 100 MG capsule  Every 8 hours     11/11/18 [place], [place], PA-C 11/11/18 [place], [place], DO 11/11/18 [phone]

## 2018-11-23 ENCOUNTER — Other Ambulatory Visit: Payer: Self-pay | Admitting: Internal Medicine

## 2018-12-29 NOTE — Telephone Encounter (Signed)
done

## 2019-11-21 NOTE — Telephone Encounter (Signed)
error 

## 2020-11-19 ENCOUNTER — Emergency Department (HOSPITAL_COMMUNITY)
Admission: EM | Admit: 2020-11-19 | Discharge: 2020-11-19 | Disposition: A | Discharge status: Home / Self Care | Payer: Medicaid Other | Source: Home / Self Care | Attending: Emergency Medicine | Admitting: Emergency Medicine

## 2020-11-19 ENCOUNTER — Encounter (HOSPITAL_BASED_OUTPATIENT_CLINIC_OR_DEPARTMENT_OTHER): Payer: Self-pay | Admitting: *Deleted

## 2020-11-19 ENCOUNTER — Emergency Department (HOSPITAL_BASED_OUTPATIENT_CLINIC_OR_DEPARTMENT_OTHER)
Admission: EM | Admit: 2020-11-19 | Discharge: 2020-11-19 | Disposition: A | Discharge status: Home / Self Care | Payer: Medicaid Other | Attending: Emergency Medicine | Admitting: Emergency Medicine

## 2020-11-19 ENCOUNTER — Encounter (HOSPITAL_COMMUNITY): Payer: Self-pay

## 2020-11-19 ENCOUNTER — Other Ambulatory Visit: Payer: Self-pay

## 2020-11-19 DIAGNOSIS — K0889 Other specified disorders of teeth and supporting structures: Secondary | ICD-10-CM | POA: Insufficient documentation

## 2020-11-19 DIAGNOSIS — F17210 Nicotine dependence, cigarettes, uncomplicated: Secondary | ICD-10-CM | POA: Insufficient documentation

## 2020-11-19 DIAGNOSIS — H6693 Otitis media, unspecified, bilateral: Secondary | ICD-10-CM

## 2020-11-19 DIAGNOSIS — H6593 Unspecified nonsuppurative otitis media, bilateral: Secondary | ICD-10-CM | POA: Insufficient documentation

## 2020-11-19 DIAGNOSIS — Z79899 Other long term (current) drug therapy: Secondary | ICD-10-CM | POA: Insufficient documentation

## 2020-11-19 DIAGNOSIS — Z5321 Procedure and treatment not carried out due to patient leaving prior to being seen by health care provider: Secondary | ICD-10-CM | POA: Insufficient documentation

## 2020-11-19 MED ORDER — AMOXICILLIN-POT CLAVULANATE 875-125 MG PO TABS: 1.0000 | ORAL_TABLET | Freq: Two times a day (BID) | ORAL | 0 refills | Status: AC

## 2020-11-19 MED ORDER — NAPROXEN 500 MG PO TABS
500.0000 mg | ORAL_TABLET | Freq: Once | ORAL | Status: AC
  Administered 2020-11-19: 500 mg via ORAL
  Filled 2020-11-19: qty 1

## 2020-11-19 MED ORDER — NAPROXEN 500 MG PO TABS: 500.0000 mg | ORAL_TABLET | Freq: Two times a day (BID) | ORAL | 0 refills | Status: AC

## 2020-11-19 MED ORDER — AMOXICILLIN-POT CLAVULANATE 875-125 MG PO TABS
1.0000 | ORAL_TABLET | Freq: Once | ORAL | Status: AC
  Administered 2020-11-19: 1 via ORAL
  Filled 2020-11-19: qty 1

## 2020-11-19 NOTE — Discharge Instructions (Addendum)
Please take antibiotics for the next week as prescribed.  You can also use prescribed naproxen twice daily to help with pain and Tylenol 1000 mg every 6 hours.  You can also use Orajel for the pain you are having in your mouth.  Please follow-up with oral surgeon as planned.  If your ear pain does not improve please follow-up with Dr. Pollyann Kennedy with ENT.  In addition to antibiotics you can use over-the-counter decongestants like Flonase and Zyrtec to help with your effusion and muffled hearing.

## 2020-11-19 NOTE — ED Triage Notes (Signed)
Dental pain. He was seen by his dentist yesterday and referred to an oral surgeon to remove part of a wisdom tooth that broke off years ago when he had his wisdom teeth removed.

## 2020-11-19 NOTE — ED Triage Notes (Signed)
Patient states that he has right ear pain that radiates into his right jaw. Patient also states that he can not hear out of his left ear.

## 2020-11-19 NOTE — ED Provider Notes (Signed)
  Patient left without being seen after triage.  I did not have the opportunity to see or examine this patient.  Vitals:   11/19/20 1618 11/19/20 1622  BP:  (!) 159/105  Pulse:  96  Resp:  20  Temp:  98 F (36.7 C)  TempSrc:  Oral  SpO2:  98%  Weight: (!) 162.3 kg   Height: 6\' 2"  (1.88 m)       11/19/20 1741    01/19/21, MD 11/19/20 2348

## 2020-11-19 NOTE — ED Provider Notes (Signed)
[facility] DE[facility] Provider Note   CSN: [id number] Arrival date & time: 11/19/20  1749     History Chief Complaint  Patient presents with  . Otalgia    Donald Heck Modesitt Brooke Bonito. is a 30 y.o. male.  Donald Heck Prescher Brooke Bonito. is a 30 y.o. male who presents for evaluation of right lower jaw pain and right ear pain.  Symptoms have been going on for about 2 days.  Pain is constant and worsening.  He saw his dentist for the symptoms and was referred to an oral surgeon but not start new medications.  Has been using tylenol and oragel without relief. Feels like there is a bony prominence on the right lower inner gums. Reports he has also been having pain in the right ear, and wasn't sure of this was due to dental issues. No drainage from ear. Reports some muffled hearing. Had an ear infection a few months ago. No fevers. Has not been on any antibiotics since symptoms began. No other aggravating or alleviating factors.        Past Medical History:  Diagnosis Date  . Anxiety   . Depression 2012   History of self injury.  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 Seizures [facility])     Patient Active Problem List   Diagnosis Date Noted  . Bipolar disorder ([place]) 11/14/2016  . Gastritis and gastroduodenitis 03/14/2016  . Vitamin D deficiency 12/02/2015  . Seizures ([place]) 10/09/2015  . Obesity 10/09/2015  . Back pain 09/02/2015  . 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 Headache 07/09/2015  . Paresthesia 07/07/2015  . 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 GBS (Guillain Barre syndrome) ([place]) 05/24/2015  . Depression 05/15/2015  . Tobacco use disorder 05/12/2015    Past Surgical History:  Procedure Laterality Date  . Nasal Cauterization    . TONSILLECTOMY         Family History  Problem Relation Age of Onset  . Cancer Mother   . Stroke Maternal Grandmother     Social History    Tobacco Use  . Smoking status: Current Some Day Smoker    Packs/day: 0.25    Types: Cigarettes  . Smokeless tobacco: Never Used  Vaping Use  . Vaping Use: Never used  Substance Use Topics  . Alcohol use: Yes    Comment: occasional   . Drug use: Yes    Types: Marijuana    Comment: pt smokes marijuana daily     Home Medications Prior to Admission medications   Medication Sig Start Date End Date Taking? Authorizing Provider  amoxicillin-clavulanate (AUGMENTIN) 875-125 MG tablet Take 1 tablet by mouth 2 (two) times daily. One po bid x 7 days 11/19/20  Yes Jacqlyn Larsen, PA-C  naproxen (NAPROSYN) 500 MG tablet Take 1 tablet (500 mg total) by mouth 2 (two) times daily. 11/19/20  Yes Jacqlyn Larsen, PA-C  amLODipine (NORVASC) 10 MG tablet Take 1 tablet (10 mg total) by mouth daily. 07/02/18   Ladell Pier, MD  benzonatate (TESSALON) 100 MG capsule Take 1 capsule (100 mg total) by mouth every 8 (eight) hours. 11/11/18   Larene Pickett, PA-C  Blood Pressure KIT Check blood pressure as directed 11/04/16   Tresa Garter, MD  cetirizine (ZYRTEC) 10 MG tablet Take 1 tablet (10 mg total) by mouth daily. 01/25/17   Maren Reamer, MD  Cholecalciferol (VITAMIN D3) 5000 units CAPS Take  1 capsule (5,000 Units total) by mouth daily. 12/07/16   Maren Reamer, MD  cyclobenzaprine (FLEXERIL) 5 MG tablet Take 1 tablet (5 mg total) by mouth 3 (three) times daily as needed for muscle spasms. Each prescription to last 1 month 07/02/18   Ladell Pier, MD  fenofibrate (TRICOR) 145 MG tablet Take 1 tablet (145 mg total) by mouth daily. Patient not taking: Reported on 02/10/2017 12/19/16 12/19/17  Maren Reamer, MD  gabapentin (NEURONTIN) 300 MG capsule Take 1 capsule (300 mg total) by mouth 3 (three) times daily. 07/02/18   Ladell Pier, MD  levETIRAcetam (KEPPRA) 500 MG tablet Take 1 tablet (500 mg total) by mouth 2 (two) times daily. 12/07/16   Maren Reamer, MD  levETIRAcetam  (KEPPRA) 500 MG tablet Take 1 tablet (500 mg total) by mouth 2 (two) times daily. 07/18/18   Ladell Pier, MD  meloxicam (MOBIC) 15 MG tablet Take 1 tablet (15 mg total) by mouth daily. 03/20/17   Ladell Pier, MD  miconazole (MICOTIN) 2 % cream Apply to affected area twice a day 07/28/17   Ladell Pier, MD  ondansetron (ZOFRAN ODT) 4 MG disintegrating tablet Take 1 tablet (4 mg total) by mouth every 8 (eight) hours as needed for nausea. 11/11/18   Larene Pickett, PA-C  oseltamivir (TAMIFLU) 75 MG capsule Take 1 capsule (75 mg total) by mouth every 12 (twelve) hours. 11/11/18   Larene Pickett, PA-C  pantoprazole (PROTONIX) 40 MG tablet Take 1 tablet (40 mg total) by mouth daily. 09/21/17   Ladell Pier, MD  traZODone (DESYREL) 100 MG tablet Take 100 mg by mouth at bedtime.     [provider]  VOLTAREN 1 % GEL Apply 2 g topically 4 (four) times daily. 02/10/17   Ladell Pier, MD    Allergies    Ibuprofen and Fluzone [influenza virus vaccine]  Review of Systems   Review of Systems  Constitutional: Negative for chills and fever.  HENT: Positive for dental problem, ear pain, hearing loss and rhinorrhea. Negative for congestion, ear discharge, facial swelling, sore throat and trouble swallowing.   Respiratory: Negative for cough.   Gastrointestinal: Negative for nausea and vomiting.  Skin: Negative for color change and rash.  Neurological: Negative for headaches.  All other systems reviewed and are negative.   Physical Exam Updated Vital Signs BP (!) 183/100 (BP Location: Left Arm)   Pulse 83   Temp 98.6 F (37 C) (Oral)   Resp 18   Ht 6' 2"  (1.88 m)   Wt (!) 162.3 kg   SpO2 98%   BMI 45.93 kg/m   Physical Exam Vitals and nursing note reviewed.  Constitutional:      General: He is not in acute distress.    Appearance: Normal appearance. He is well-developed, normal weight and well-nourished. He is not ill-appearing or diaphoretic.  HENT:      Head: Normocephalic and atraumatic.     Right Ear: Ear canal and external ear normal. Tympanic membrane is erythematous and bulging.     Left Ear: Ear canal and external ear normal. Tympanic membrane is erythematous and bulging.     Mouth/Throat:     Mouth: Mucous membranes are moist.     Pharynx: Oropharynx is clear.     Comments: Some teeth in poor dentition, bony prominence present over the right lower inner gums with some overlying erythema of gums, no abscess, fluctuance or induration. No sublingual tenderness, posterior  oropharynx clear Eyes:     General:        Right eye: No discharge.        Left eye: No discharge.  Pulmonary:     Effort: Pulmonary effort is normal. No respiratory distress.  Skin:    General: Skin is warm and dry.  Neurological:     Mental Status: He is alert and oriented to person, place, and time.     Coordination: Coordination normal.  Psychiatric:        Mood and Affect: Mood and affect and mood normal.        Behavior: Behavior normal.     ED Results / Procedures / Treatments   Labs (all labs ordered are listed, but only abnormal results are displayed) Labs Reviewed - No data to display  EKG None  Radiology No results found.  Procedures Procedures   Medications Ordered in ED Medications  naproxen (NAPROSYN) tablet 500 mg (has no administration in time range)  amoxicillin-clavulanate (AUGMENTIN) 875-125 MG per tablet 1 tablet (has no administration in time range)    ED Course  I have reviewed the triage vital signs and the nursing notes.  Pertinent labs & imaging results that were available during my care of the patient were reviewed by me and considered in my medical decision making (see chart for details).    MDM Rules/Calculators/A&P                         Patient with bilateral otitis media on exam, no evidence of mastoiditis or malignant otitis externa. No signs of dental abscess or ludwig's angina. Will treat with Augmentin and  NSAIDs for pain relief. Given recent ear infection recommend ENT follow up if symptoms not improving. Oral surgery follow up as recommended by dentist. Return precautions discussed. Pt expresses understanding and agrees with plan.   Final Clinical Impression(s) / ED Diagnoses Final diagnoses:  Bilateral otitis media, unspecified otitis media type  Pain, dental    Rx / DC Orders ED Discharge Orders         Ordered    amoxicillin-clavulanate (AUGMENTIN) 875-125 MG tablet  2 times daily        11/19/20 1916    naproxen (NAPROSYN) 500 MG tablet  2 times daily        11/19/20 1916           Janet Berlin 11/22/20 2344    Truddie Hidden, MD 11/23/20 1011

## 2020-11-19 NOTE — ED Notes (Signed)
Assumed care of this patient. Vitals taken. A&Ox4. Respirations regular/unlabored. Pt discharged and ambulatory with steady gait. NAD.
